# Patient Record
Sex: Female | Born: 1960 | Race: White | Hispanic: No | Marital: Married | Smoking: Former smoker
Health system: Southern US, Community
[De-identification: ages and names within clinical notes are randomized; demographics above are authoritative.]

## PROBLEM LIST (undated history)

## (undated) DIAGNOSIS — G8929 Other chronic pain: Secondary | ICD-10-CM

## (undated) DIAGNOSIS — I2699 Other pulmonary embolism without acute cor pulmonale: Secondary | ICD-10-CM

## (undated) DIAGNOSIS — I2510 Atherosclerotic heart disease of native coronary artery without angina pectoris: Secondary | ICD-10-CM

## (undated) DIAGNOSIS — I509 Heart failure, unspecified: Secondary | ICD-10-CM

## (undated) DIAGNOSIS — K219 Gastro-esophageal reflux disease without esophagitis: Secondary | ICD-10-CM

## (undated) DIAGNOSIS — K449 Diaphragmatic hernia without obstruction or gangrene: Secondary | ICD-10-CM

## (undated) DIAGNOSIS — M5136 Other intervertebral disc degeneration, lumbar region: Secondary | ICD-10-CM

## (undated) HISTORY — DX: Other intervertebral disc degeneration, lumbar region: M51.36

## (undated) HISTORY — DX: Other pulmonary embolism without acute cor pulmonale: I26.99

## (undated) HISTORY — DX: Atherosclerotic heart disease of native coronary artery without angina pectoris: I25.10

## (undated) HISTORY — DX: Gastro-esophageal reflux disease without esophagitis: K21.9

## (undated) HISTORY — DX: Diaphragmatic hernia without obstruction or gangrene: K44.9

## (undated) HISTORY — DX: Other chronic pain: G89.29

## (undated) HISTORY — DX: Heart failure, unspecified: I50.9

---

## 2001-01-26 ENCOUNTER — Inpatient Hospital Stay (HOSPITAL_COMMUNITY): Admission: EM | Admit: 2001-01-26 | Discharge: 2001-01-28 | Payer: Self-pay | Admitting: Psychiatry

## 2001-02-27 ENCOUNTER — Inpatient Hospital Stay (HOSPITAL_COMMUNITY): Admission: RE | Admit: 2001-02-27 | Discharge: 2001-02-28 | Payer: Self-pay | Admitting: Psychiatry

## 2003-02-05 ENCOUNTER — Ambulatory Visit (HOSPITAL_COMMUNITY): Admission: RE | Admit: 2003-02-05 | Discharge: 2003-02-05 | Payer: Self-pay | Admitting: Neurology

## 2010-01-22 ENCOUNTER — Encounter: Payer: Self-pay | Admitting: Neurology

## 2012-02-26 ENCOUNTER — Ambulatory Visit: Payer: Medicaid Other | Admitting: Physical Therapy

## 2012-03-05 ENCOUNTER — Ambulatory Visit: Payer: Medicaid Other | Attending: Anesthesiology | Admitting: Physical Therapy

## 2017-01-06 DIAGNOSIS — I219 Acute myocardial infarction, unspecified: Secondary | ICD-10-CM

## 2017-01-06 DIAGNOSIS — E785 Hyperlipidemia, unspecified: Secondary | ICD-10-CM

## 2017-01-06 DIAGNOSIS — Z720 Tobacco use: Secondary | ICD-10-CM

## 2017-01-06 DIAGNOSIS — R079 Chest pain, unspecified: Secondary | ICD-10-CM

## 2017-01-06 DIAGNOSIS — I10 Essential (primary) hypertension: Secondary | ICD-10-CM

## 2018-12-30 ENCOUNTER — Inpatient Hospital Stay (HOSPITAL_COMMUNITY)
Admission: AD | Admit: 2018-12-30 | Discharge: 2019-01-18 | DRG: 177 | Disposition: A | Discharge status: Home / Self Care | Payer: Medicaid Other | Source: Other Acute Inpatient Hospital | Attending: Student | Admitting: Student

## 2018-12-30 ENCOUNTER — Encounter (HOSPITAL_COMMUNITY): Payer: Self-pay | Admitting: Internal Medicine

## 2018-12-30 ENCOUNTER — Inpatient Hospital Stay (HOSPITAL_COMMUNITY): Payer: Medicaid Other

## 2018-12-30 ENCOUNTER — Other Ambulatory Visit: Payer: Self-pay

## 2018-12-30 DIAGNOSIS — J1289 Other viral pneumonia: Secondary | ICD-10-CM | POA: Diagnosis not present

## 2018-12-30 DIAGNOSIS — J9601 Acute respiratory failure with hypoxia: Secondary | ICD-10-CM | POA: Diagnosis present

## 2018-12-30 DIAGNOSIS — G8929 Other chronic pain: Secondary | ICD-10-CM | POA: Diagnosis present

## 2018-12-30 DIAGNOSIS — C7989 Secondary malignant neoplasm of other specified sites: Secondary | ICD-10-CM | POA: Diagnosis not present

## 2018-12-30 DIAGNOSIS — C7951 Secondary malignant neoplasm of bone: Secondary | ICD-10-CM | POA: Diagnosis present

## 2018-12-30 DIAGNOSIS — C3431 Malignant neoplasm of lower lobe, right bronchus or lung: Secondary | ICD-10-CM | POA: Diagnosis present

## 2018-12-30 DIAGNOSIS — C787 Secondary malignant neoplasm of liver and intrahepatic bile duct: Secondary | ICD-10-CM | POA: Diagnosis present

## 2018-12-30 DIAGNOSIS — I5041 Acute combined systolic (congestive) and diastolic (congestive) heart failure: Secondary | ICD-10-CM | POA: Diagnosis present

## 2018-12-30 DIAGNOSIS — I110 Hypertensive heart disease with heart failure: Secondary | ICD-10-CM | POA: Diagnosis not present

## 2018-12-30 DIAGNOSIS — C771 Secondary and unspecified malignant neoplasm of intrathoracic lymph nodes: Secondary | ICD-10-CM | POA: Diagnosis not present

## 2018-12-30 DIAGNOSIS — I5043 Acute on chronic combined systolic (congestive) and diastolic (congestive) heart failure: Secondary | ICD-10-CM | POA: Diagnosis not present

## 2018-12-30 DIAGNOSIS — K219 Gastro-esophageal reflux disease without esophagitis: Secondary | ICD-10-CM | POA: Diagnosis present

## 2018-12-30 DIAGNOSIS — Z7189 Other specified counseling: Secondary | ICD-10-CM | POA: Diagnosis not present

## 2018-12-30 DIAGNOSIS — E876 Hypokalemia: Secondary | ICD-10-CM | POA: Diagnosis present

## 2018-12-30 DIAGNOSIS — IMO0002 Reserved for concepts with insufficient information to code with codable children: Secondary | ICD-10-CM | POA: Diagnosis present

## 2018-12-30 DIAGNOSIS — R160 Hepatomegaly, not elsewhere classified: Secondary | ICD-10-CM

## 2018-12-30 DIAGNOSIS — E118 Type 2 diabetes mellitus with unspecified complications: Secondary | ICD-10-CM | POA: Diagnosis not present

## 2018-12-30 DIAGNOSIS — C799 Secondary malignant neoplasm of unspecified site: Secondary | ICD-10-CM | POA: Diagnosis not present

## 2018-12-30 DIAGNOSIS — F419 Anxiety disorder, unspecified: Secondary | ICD-10-CM | POA: Diagnosis present

## 2018-12-30 DIAGNOSIS — G4700 Insomnia, unspecified: Secondary | ICD-10-CM | POA: Diagnosis present

## 2018-12-30 DIAGNOSIS — K858 Other acute pancreatitis without necrosis or infection: Secondary | ICD-10-CM | POA: Diagnosis not present

## 2018-12-30 DIAGNOSIS — U071 COVID-19: Principal | ICD-10-CM | POA: Diagnosis present

## 2018-12-30 DIAGNOSIS — M545 Low back pain: Secondary | ICD-10-CM | POA: Diagnosis not present

## 2018-12-30 DIAGNOSIS — Z515 Encounter for palliative care: Secondary | ICD-10-CM

## 2018-12-30 DIAGNOSIS — R748 Abnormal levels of other serum enzymes: Secondary | ICD-10-CM | POA: Diagnosis present

## 2018-12-30 DIAGNOSIS — Z9861 Coronary angioplasty status: Secondary | ICD-10-CM | POA: Diagnosis not present

## 2018-12-30 DIAGNOSIS — I252 Old myocardial infarction: Secondary | ICD-10-CM

## 2018-12-30 DIAGNOSIS — F329 Major depressive disorder, single episode, unspecified: Secondary | ICD-10-CM | POA: Diagnosis present

## 2018-12-30 DIAGNOSIS — F17210 Nicotine dependence, cigarettes, uncomplicated: Secondary | ICD-10-CM | POA: Diagnosis present

## 2018-12-30 DIAGNOSIS — I10 Essential (primary) hypertension: Secondary | ICD-10-CM | POA: Diagnosis not present

## 2018-12-30 DIAGNOSIS — I2583 Coronary atherosclerosis due to lipid rich plaque: Secondary | ICD-10-CM | POA: Diagnosis not present

## 2018-12-30 DIAGNOSIS — Z720 Tobacco use: Secondary | ICD-10-CM | POA: Diagnosis not present

## 2018-12-30 DIAGNOSIS — J441 Chronic obstructive pulmonary disease with (acute) exacerbation: Secondary | ICD-10-CM | POA: Diagnosis not present

## 2018-12-30 DIAGNOSIS — G893 Neoplasm related pain (acute) (chronic): Secondary | ICD-10-CM | POA: Diagnosis present

## 2018-12-30 DIAGNOSIS — Z765 Malingerer [conscious simulation]: Secondary | ICD-10-CM | POA: Diagnosis not present

## 2018-12-30 DIAGNOSIS — Z7984 Long term (current) use of oral hypoglycemic drugs: Secondary | ICD-10-CM

## 2018-12-30 DIAGNOSIS — E663 Overweight: Secondary | ICD-10-CM | POA: Diagnosis present

## 2018-12-30 DIAGNOSIS — J440 Chronic obstructive pulmonary disease with acute lower respiratory infection: Secondary | ICD-10-CM | POA: Diagnosis not present

## 2018-12-30 DIAGNOSIS — E1165 Type 2 diabetes mellitus with hyperglycemia: Secondary | ICD-10-CM | POA: Diagnosis present

## 2018-12-30 DIAGNOSIS — I5042 Chronic combined systolic (congestive) and diastolic (congestive) heart failure: Secondary | ICD-10-CM | POA: Diagnosis not present

## 2018-12-30 DIAGNOSIS — I2510 Atherosclerotic heart disease of native coronary artery without angina pectoris: Secondary | ICD-10-CM | POA: Diagnosis not present

## 2018-12-30 DIAGNOSIS — R0602 Shortness of breath: Secondary | ICD-10-CM

## 2018-12-30 DIAGNOSIS — J1282 Pneumonia due to coronavirus disease 2019: Secondary | ICD-10-CM | POA: Diagnosis present

## 2018-12-30 DIAGNOSIS — Z6829 Body mass index (BMI) 29.0-29.9, adult: Secondary | ICD-10-CM | POA: Diagnosis not present

## 2018-12-30 DIAGNOSIS — K859 Acute pancreatitis without necrosis or infection, unspecified: Secondary | ICD-10-CM | POA: Diagnosis present

## 2018-12-30 DIAGNOSIS — R7401 Elevation of levels of liver transaminase levels: Secondary | ICD-10-CM | POA: Diagnosis present

## 2018-12-30 DIAGNOSIS — T501X5A Adverse effect of loop [high-ceiling] diuretics, initial encounter: Secondary | ICD-10-CM | POA: Diagnosis not present

## 2018-12-30 DIAGNOSIS — R1013 Epigastric pain: Secondary | ICD-10-CM | POA: Diagnosis not present

## 2018-12-30 DIAGNOSIS — E785 Hyperlipidemia, unspecified: Secondary | ICD-10-CM | POA: Diagnosis not present

## 2018-12-30 DIAGNOSIS — Z8711 Personal history of peptic ulcer disease: Secondary | ICD-10-CM

## 2018-12-30 DIAGNOSIS — C3491 Malignant neoplasm of unspecified part of right bronchus or lung: Secondary | ICD-10-CM | POA: Diagnosis not present

## 2018-12-30 DIAGNOSIS — Z86711 Personal history of pulmonary embolism: Secondary | ICD-10-CM

## 2018-12-30 DIAGNOSIS — J159 Unspecified bacterial pneumonia: Secondary | ICD-10-CM | POA: Diagnosis not present

## 2018-12-30 LAB — GLUCOSE, CAPILLARY
Glucose-Capillary: 158 mg/dL — ABNORMAL HIGH (ref 70–99)
Glucose-Capillary: 280 mg/dL — ABNORMAL HIGH (ref 70–99)
Glucose-Capillary: 175 mg/dL — ABNORMAL HIGH (ref 70–99)

## 2018-12-30 LAB — ABO/RH: ABO/RH(D): A POS

## 2018-12-30 LAB — TYPE AND SCREEN
ABO/RH(D): A POS
Antibody Screen: NEGATIVE

## 2018-12-30 LAB — COMPREHENSIVE METABOLIC PANEL
ALT: 28 U/L (ref 0–44)
AST: 36 U/L (ref 15–41)
Albumin: 1.8 g/dL — ABNORMAL LOW (ref 3.5–5.0)
Alkaline Phosphatase: 160 U/L — ABNORMAL HIGH (ref 38–126)
Anion gap: 12 (ref 5–15)
BUN: 17 mg/dL (ref 6–20)
CO2: 28 mmol/L (ref 22–32)
Calcium: 7.9 mg/dL — ABNORMAL LOW (ref 8.9–10.3)
Chloride: 101 mmol/L (ref 98–111)
Creatinine, Ser: 0.72 mg/dL (ref 0.44–1.00)
GFR calc Af Amer: >60 mL/min (ref >60)
GFR calc non Af Amer: >60 mL/min (ref >60)
Glucose, Bld: 155 mg/dL — ABNORMAL HIGH (ref 70–99)
Potassium: 3.3 mmol/L — ABNORMAL LOW (ref 3.5–5.1)
Sodium: 141 mmol/L (ref 135–145)
Total Bilirubin: 0.3 mg/dL (ref 0.3–1.2)
Total Protein: 5.6 g/dL — ABNORMAL LOW (ref 6.5–8.1)

## 2018-12-30 LAB — CBC WITH DIFFERENTIAL/PLATELET
Abs Immature Granulocytes: 0.24 10*3/uL — ABNORMAL HIGH (ref 0.00–0.07)
Basophils Absolute: 0 10*3/uL (ref 0.0–0.1)
Basophils Relative: 0 %
Eosinophils Absolute: 0 10*3/uL (ref 0.0–0.5)
Eosinophils Relative: 0 %
HCT: 35 % — ABNORMAL LOW (ref 36.0–46.0)
Hemoglobin: 10.4 g/dL — ABNORMAL LOW (ref 12.0–15.0)
Immature Granulocytes: 3 %
Lymphocytes Relative: 5 %
Lymphs Abs: 0.5 10*3/uL — ABNORMAL LOW (ref 0.7–4.0)
MCH: 25.6 pg — ABNORMAL LOW (ref 26.0–34.0)
MCHC: 29.7 g/dL — ABNORMAL LOW (ref 30.0–36.0)
MCV: 86.2 fL (ref 80.0–100.0)
Monocytes Absolute: 0.2 10*3/uL (ref 0.1–1.0)
Monocytes Relative: 2 %
Neutro Abs: 8.1 10*3/uL — ABNORMAL HIGH (ref 1.7–7.7)
Neutrophils Relative %: 90 %
Platelets: 248 10*3/uL (ref 150–400)
RBC: 4.06 MIL/uL (ref 3.87–5.11)
RDW: 15.7 % — ABNORMAL HIGH (ref 11.5–15.5)
WBC: 9 10*3/uL (ref 4.0–10.5)
nRBC: 0.4 % — ABNORMAL HIGH (ref 0.0–0.2)

## 2018-12-30 LAB — C-REACTIVE PROTEIN: CRP: 5.7 mg/dL — ABNORMAL HIGH (ref <1.0)

## 2018-12-30 LAB — PROCALCITONIN
Procalcitonin: 4.71 ng/mL
Procalcitonin: 4.41 ng/mL

## 2018-12-30 LAB — D-DIMER, QUANTITATIVE: D-Dimer, Quant: 1.92 ug/mL-FEU — ABNORMAL HIGH (ref 0.00–0.50)

## 2018-12-30 LAB — HEMOGLOBIN A1C
Hgb A1c MFr Bld: 7.2 % — ABNORMAL HIGH (ref 4.8–5.6)
Mean Plasma Glucose: 159.94 mg/dL

## 2018-12-30 LAB — HIV ANTIBODY (ROUTINE TESTING W REFLEX): HIV Screen 4th Generation wRfx: NONREACTIVE

## 2018-12-30 LAB — FERRITIN: Ferritin: 64 ng/mL (ref 11–307)

## 2018-12-30 LAB — BRAIN NATRIURETIC PEPTIDE: B Natriuretic Peptide: 398.8 pg/mL — ABNORMAL HIGH (ref 0.0–100.0)

## 2018-12-30 MED ORDER — METOPROLOL SUCCINATE ER 50 MG PO TB24
50.0000 mg | ORAL_TABLET | Freq: Every day | ORAL | Status: DC
  Administered 2018-12-30 – 2019-01-18 (×20): 50 mg via ORAL
  Filled 2018-12-30: qty 2
  Filled 2018-12-30 (×2): qty 1
  Filled 2018-12-30: qty 2
  Filled 2018-12-30: qty 1
  Filled 2018-12-30 (×3): qty 2
  Filled 2018-12-30: qty 1
  Filled 2018-12-30 (×4): qty 2
  Filled 2018-12-30 (×2): qty 1
  Filled 2018-12-30: qty 2
  Filled 2018-12-30: qty 1
  Filled 2018-12-30: qty 2
  Filled 2018-12-30: qty 1
  Filled 2018-12-30: qty 2

## 2018-12-30 MED ORDER — SODIUM CHLORIDE 0.9 % IV SOLN: 100.0000 mg | Freq: Every day | INTRAVENOUS | Status: DC

## 2018-12-30 MED ORDER — OXYCODONE HCL 5 MG PO TABS
5.0000 mg | ORAL_TABLET | Freq: Four times a day (QID) | ORAL | Status: DC | PRN
  Administered 2018-12-30 – 2018-12-31 (×3): 5 mg via ORAL
  Filled 2018-12-30 (×3): qty 1

## 2018-12-30 MED ORDER — ACETAMINOPHEN 325 MG PO TABS
650.0000 mg | ORAL_TABLET | Freq: Four times a day (QID) | ORAL | Status: DC | PRN
  Administered 2018-12-31 – 2019-01-08 (×4): 650 mg via ORAL
  Filled 2018-12-30 (×4): qty 2

## 2018-12-30 MED ORDER — SODIUM CHLORIDE 0.9 % IV SOLN
1.0000 g | INTRAVENOUS | Status: AC
  Administered 2018-12-30 – 2019-01-03 (×5): 1 g via INTRAVENOUS
  Filled 2018-12-30 (×5): qty 10

## 2018-12-30 MED ORDER — NITROGLYCERIN 0.4 MG SL SUBL: 0.4000 mg | SUBLINGUAL_TABLET | SUBLINGUAL | Status: DC | PRN

## 2018-12-30 MED ORDER — CEFTRIAXONE SODIUM 1 G IJ SOLR: 1.0000 g | INTRAMUSCULAR | Status: DC

## 2018-12-30 MED ORDER — PANTOPRAZOLE SODIUM 40 MG PO TBEC
40.0000 mg | DELAYED_RELEASE_TABLET | Freq: Two times a day (BID) | ORAL | Status: DC
  Administered 2018-12-30 – 2019-01-18 (×37): 40 mg via ORAL
  Filled 2018-12-30 (×40): qty 1

## 2018-12-30 MED ORDER — SODIUM CHLORIDE 0.9 % IV SOLN
500.0000 mg | INTRAVENOUS | Status: AC
  Administered 2018-12-30 – 2019-01-01 (×3): 500 mg via INTRAVENOUS
  Filled 2018-12-30 (×3): qty 500

## 2018-12-30 MED ORDER — ALBUTEROL SULFATE HFA 108 (90 BASE) MCG/ACT IN AERS
2.0000 | INHALATION_SPRAY | RESPIRATORY_TRACT | Status: DC | PRN
  Administered 2018-12-30 – 2019-01-17 (×9): 2 via RESPIRATORY_TRACT
  Filled 2018-12-30: qty 6.7

## 2018-12-30 MED ORDER — ONDANSETRON HCL 4 MG/2ML IJ SOLN
4.0000 mg | Freq: Four times a day (QID) | INTRAMUSCULAR | Status: DC | PRN
  Administered 2019-01-03 – 2019-01-06 (×4): 4 mg via INTRAVENOUS
  Filled 2018-12-30 (×5): qty 2

## 2018-12-30 MED ORDER — INSULIN ASPART 100 UNIT/ML ~~LOC~~ SOLN
0.0000 [IU] | Freq: Three times a day (TID) | SUBCUTANEOUS | Status: DC
  Administered 2018-12-30: 5 [IU] via SUBCUTANEOUS
  Administered 2018-12-30: 2 [IU] via SUBCUTANEOUS
  Administered 2018-12-31: 3 [IU] via SUBCUTANEOUS
  Administered 2018-12-31 – 2019-01-01 (×3): 2 [IU] via SUBCUTANEOUS
  Administered 2019-01-01: 1 [IU] via SUBCUTANEOUS
  Administered 2019-01-01: 5 [IU] via SUBCUTANEOUS
  Administered 2019-01-01: 17:00:00 2 [IU] via SUBCUTANEOUS
  Administered 2019-01-02: 1 [IU] via SUBCUTANEOUS
  Administered 2019-01-03: 13:00:00 2 [IU] via SUBCUTANEOUS
  Administered 2019-01-04 – 2019-01-05 (×3): 1 [IU] via SUBCUTANEOUS
  Administered 2019-01-05: 21:00:00 3 [IU] via SUBCUTANEOUS
  Administered 2019-01-05 (×2): 1 [IU] via SUBCUTANEOUS
  Administered 2019-01-06: 13:00:00 2 [IU] via SUBCUTANEOUS
  Administered 2019-01-06: 09:00:00 1 [IU] via SUBCUTANEOUS
  Administered 2019-01-06: 2 [IU] via SUBCUTANEOUS
  Administered 2019-01-07: 1 [IU] via SUBCUTANEOUS
  Administered 2019-01-08: 17:00:00 2 [IU] via SUBCUTANEOUS
  Administered 2019-01-08: 13:00:00 7 [IU] via SUBCUTANEOUS
  Administered 2019-01-09: 13:00:00 3 [IU] via SUBCUTANEOUS
  Administered 2019-01-09 – 2019-01-10 (×3): 2 [IU] via SUBCUTANEOUS

## 2018-12-30 MED ORDER — SODIUM CHLORIDE 0.9 % IV SOLN: 250.0000 mL | INTRAVENOUS | Status: DC | PRN

## 2018-12-30 MED ORDER — SUCRALFATE 1 GM/10ML PO SUSP
1.0000 g | Freq: Three times a day (TID) | ORAL | Status: DC
  Administered 2018-12-30 – 2019-01-18 (×67): 1 g via ORAL
  Filled 2018-12-30 (×80): qty 10

## 2018-12-30 MED ORDER — SODIUM CHLORIDE 0.9 % IV SOLN
100.0000 mg | Freq: Every day | INTRAVENOUS | Status: AC
  Administered 2018-12-31 – 2019-01-02 (×3): 100 mg via INTRAVENOUS
  Filled 2018-12-30 (×3): qty 20

## 2018-12-30 MED ORDER — BISACODYL 5 MG PO TBEC: 5.0000 mg | DELAYED_RELEASE_TABLET | Freq: Every day | ORAL | Status: DC | PRN

## 2018-12-30 MED ORDER — ALPRAZOLAM 0.5 MG PO TABS
0.2500 mg | ORAL_TABLET | Freq: Three times a day (TID) | ORAL | Status: DC | PRN
  Administered 2018-12-30 – 2018-12-31 (×2): 0.25 mg via ORAL
  Filled 2018-12-30 (×2): qty 1

## 2018-12-30 MED ORDER — SODIUM CHLORIDE 0.9% FLUSH
3.0000 mL | Freq: Two times a day (BID) | INTRAVENOUS | Status: DC
  Administered 2018-12-30 – 2019-01-18 (×37): 3 mL via INTRAVENOUS

## 2018-12-30 MED ORDER — ASPIRIN 81 MG PO CHEW
81.0000 mg | CHEWABLE_TABLET | Freq: Every day | ORAL | Status: DC
  Administered 2018-12-30 – 2019-01-15 (×17): 81 mg via ORAL
  Filled 2018-12-30 (×17): qty 1

## 2018-12-30 MED ORDER — METHYLPREDNISOLONE SODIUM SUCC 40 MG IJ SOLR
40.0000 mg | Freq: Two times a day (BID) | INTRAMUSCULAR | Status: DC
  Administered 2018-12-30 – 2019-01-01 (×5): 40 mg via INTRAVENOUS
  Filled 2018-12-30 (×5): qty 1

## 2018-12-30 MED ORDER — SODIUM CHLORIDE 0.9 % IV SOLN
200.0000 mg | Freq: Once | INTRAVENOUS | Status: AC
  Administered 2018-12-30: 200 mg via INTRAVENOUS
  Filled 2018-12-30: qty 20

## 2018-12-30 MED ORDER — HYDROCOD POLST-CPM POLST ER 10-8 MG/5ML PO SUER
5.0000 mL | Freq: Two times a day (BID) | ORAL | Status: DC | PRN
  Administered 2019-01-02: 5 mL via ORAL
  Filled 2018-12-30: qty 5

## 2018-12-30 MED ORDER — ALUM & MAG HYDROXIDE-SIMETH 200-200-20 MG/5ML PO SUSP: 30.0000 mL | Freq: Four times a day (QID) | ORAL | Status: DC | PRN

## 2018-12-30 MED ORDER — ONDANSETRON HCL 4 MG PO TABS: 4.0000 mg | ORAL_TABLET | Freq: Four times a day (QID) | ORAL | Status: DC | PRN

## 2018-12-30 MED ORDER — FAMOTIDINE 20 MG PO TABS: 20.0000 mg | ORAL_TABLET | Freq: Every day | ORAL | Status: DC

## 2018-12-30 MED ORDER — POLYETHYLENE GLYCOL 3350 17 G PO PACK
17.0000 g | PACK | Freq: Every day | ORAL | Status: DC
  Administered 2018-12-31 – 2019-01-14 (×8): 17 g via ORAL
  Filled 2018-12-30 (×16): qty 1

## 2018-12-30 MED ORDER — ZINC SULFATE 220 (50 ZN) MG PO CAPS
220.0000 mg | ORAL_CAPSULE | Freq: Every day | ORAL | Status: DC
  Administered 2018-12-31 – 2019-01-18 (×19): 220 mg via ORAL
  Filled 2018-12-30 (×18): qty 1

## 2018-12-30 MED ORDER — POLYETHYLENE GLYCOL 3350 17 G PO PACK: 17.0000 g | PACK | Freq: Every day | ORAL | Status: DC | PRN

## 2018-12-30 MED ORDER — ASCORBIC ACID 500 MG PO TABS
500.0000 mg | ORAL_TABLET | Freq: Every day | ORAL | Status: DC
  Administered 2018-12-31 – 2019-01-18 (×19): 500 mg via ORAL
  Filled 2018-12-30 (×19): qty 1

## 2018-12-30 MED ORDER — CLOPIDOGREL BISULFATE 75 MG PO TABS
75.0000 mg | ORAL_TABLET | Freq: Every day | ORAL | Status: DC
  Administered 2018-12-30 – 2019-01-11 (×13): 75 mg via ORAL
  Filled 2018-12-30 (×13): qty 1

## 2018-12-30 MED ORDER — GUAIFENESIN-DM 100-10 MG/5ML PO SYRP
10.0000 mL | ORAL_SOLUTION | ORAL | Status: DC | PRN
  Administered 2019-01-09: 10 mL via ORAL
  Filled 2018-12-30: qty 10

## 2018-12-30 MED ORDER — SODIUM CHLORIDE 0.9% FLUSH
3.0000 mL | INTRAVENOUS | Status: DC | PRN
  Administered 2019-01-14: 10:00:00 3 mL via INTRAVENOUS

## 2018-12-30 MED ORDER — ENOXAPARIN SODIUM 40 MG/0.4ML ~~LOC~~ SOLN
40.0000 mg | SUBCUTANEOUS | Status: DC
  Administered 2018-12-30 – 2018-12-31 (×2): 40 mg via SUBCUTANEOUS
  Filled 2018-12-30 (×2): qty 0.4

## 2018-12-30 MED ORDER — IPRATROPIUM-ALBUTEROL 20-100 MCG/ACT IN AERS
1.0000 | INHALATION_SPRAY | Freq: Four times a day (QID) | RESPIRATORY_TRACT | Status: DC
  Administered 2018-12-30 – 2019-01-13 (×48): 1 via RESPIRATORY_TRACT
  Filled 2018-12-30: qty 4

## 2018-12-30 NOTE — H&P (Signed)
HISTORY AND PHYSICAL       PATIENT DETAILS Name: Emily Thomas Age: 58 y.o. Sex: female Date of Birth: November 07, 1960 Admit Date: 12/30/2018 [id number], No Pcp Per   Patient coming from: Transfer from [place]:  Shortness of breath since December 15.  HPI: Emily Thomas is a 58 y.o. female with medical history significant of chronic back pain due to degenerative disc disease, DM-2, HTN, history of remote pulmonary embolism no longer on anticoagulation, CAD s/p PCI approximately 1 year back-chronic epigastric pain-being worked up in the outpatient setting (EGD/colonoscopy scheduled)-presenting as a transfer from [place] for evaluation of acute hypoxic respiratory failure in the setting of COVID-19 pneumonia.  Per patient, for the past 1 month or so-she has had worsening chronic back pain-and epigastric pain.  She apparently recently saw her primary care/GI MD-and was prepped for colonoscopy/endoscopy-however due to poor bowel preparation-endoscopy evaluation could not be completed.  She claims that since December 15-after she completed a bowel prep for colonoscopy-she started feeling fatigue with generalized myalgias.  She then started developing shortness of breath that has gradually progressed over the past few days.  She presented to [facility] she had severe exertional dyspnea and could hardly walk a few feet.  At [facility] emergency room-patient was found to have pneumonia-her COVID-19 swab was positive-and she was requiring around 5 L to maintain O2 saturations above 90.  Subsequently case was discussed with the prior hospitalist at [facility] patient was accepted as a transfer  Patient denies any fever, nausea, vomiting or diarrhea.  Note: Lives at: Home Mobility: Independent Chronic Indwelling Foley:yes   REVIEW OF SYSTEMS:  Constitutional:   No  weight loss, night sweats.  HEENT:    No  headaches, Dysphagia,Tooth/dental problems,Sore throat  Cardio-vascular: No chest pain,Orthopnea, PND,lower extremity edema, anasarca, palpitations  GI:  No heartburn, indigestion, nausea, vomiting, diarrhea, melena or hematochezia  Resp: No  hemoptysis,plueritic chest pain.   Skin:  No rash or lesions.  GU:  No dysuria, change in color of urine, no urgency or frequency.  No flank pain.  Musculoskeletal: No joint pain or swelling.  No decreased range of motion.  No back pain.  Endocrine: No heat intolerance, no cold intolerance, no polyuria, no polydipsia  Psych: No change in mood or affect. No depression or anxiety.  No memory loss.   ALLERGIES:  Not on File  PAST MEDICAL HISTORY: . CHF (congestive heart failure), NYHA class II, chronic, systolic (HCC-CMS)  . Coronary artery disease involving native coronary artery of native heart with angina pectoris (HCC-CMS)  . Dyslipidemia  . Essential hypertension  . Failed back surgical syndrome  . GERD (gastroesophageal reflux disease)  . History of ST elevation myocardial infarction (STEMI)  . Insomnia  . Type 2 diabetes mellitus with hyperglycemia, without long-term current use of insulin (HCC-CMS)    PAST SURGICAL HISTORY: APPENDECTOMY    MEDICATIONS AT HOME: Prior to Admission medications   Not on File    FAMILY HISTORY: Denies history of CAD in the family.  SOCIAL HISTORY:  has no history on file for tobacco, alcohol, and drug.  PHYSICAL EXAM: Blood pressure (!) 154/71, pulse 87, temperature 98.3 F (36.8 C), temperature source Oral, resp. rate (!) 22, weight 72 kg, SpO2 92 %.  General appearance :Awake, alert, not in any distress.  Eyes:, pupils equally reactive to light and accomodation,no scleral icterus.Pink conjunctiva HEENT: Atraumatic and Normocephalic Neck:  supple, no JVD.  Resp:Good air entry bilaterally, no added sounds  CVS: S1 S2 regular, no murmurs.  GI: Bowel sounds present, Non tender and  not distended with no gaurding, rigidity or rebound. Extremities: B/L Lower Ext shows no edema, both legs are warm to touch Neurology:  speech clear,Non focal, sensation is grossly intact. Psychiatric: Normal judgment and insight. Alert and oriented x 3. Normal mood. Musculoskeletal:gait appears to be normal.No digital cyanosis Skin:No Rash, warm and dry Wounds:N/A  LABS ON ADMISSION:  I have personally reviewed following labs and imaging studies  CBC: Recent Labs  Lab 12/30/18 1110  WBC 9.0  NEUTROABS 8.1*  HGB 10.4*  HCT 35.0*  MCV 86.2  PLT 326    Basic Metabolic Panel: Recent Labs  Lab 12/30/18 1110  NA 141  K 3.3*  CL 101  CO2 28  GLUCOSE 155*  BUN 17  CREATININE 0.72  CALCIUM 7.9*    GFR: CrCl cannot be calculated (Unknown ideal weight.).  Liver Function Tests: Recent Labs  Lab 12/30/18 1110  AST 36  ALT 28  ALKPHOS 160*  BILITOT 0.3  PROT 5.6*  ALBUMIN 1.8*   No results for input(s): LIPASE, AMYLASE in the last 168 hours. No results for input(s): AMMONIA in the last 168 hours.  Coagulation Profile: No results for input(s): INR, PROTIME in the last 168 hours.  Cardiac Enzymes: No results for input(s): CKTOTAL, CKMB, CKMBINDEX, TROPONINI in the last 168 hours.  BNP (last 3 results) No results for input(s): PROBNP in the last 8760 hours.  HbA1C: No results for input(s): HGBA1C in the last 72 hours.  CBG: No results for input(s): GLUCAP in the last 168 hours.  Lipid Profile: No results for input(s): CHOL, HDL, LDLCALC, TRIG, CHOLHDL, LDLDIRECT in the last 72 hours.  Thyroid Function Tests: No results for input(s): TSH, T4TOTAL, FREET4, T3FREE, THYROIDAB in the last 72 hours.  Anemia Panel: Recent Labs    12/30/18 1110  FERRITIN 64    Urine analysis: No results found for: COLORURINE, APPEARANCEUR, LABSPEC, PHURINE, GLUCOSEU, HGBUR, BILIRUBINUR, KETONESUR, PROTEINUR, UROBILINOGEN, NITRITE, LEUKOCYTESUR  Sepsis Labs: Lactic Acid,  Venous No results found for: [place]   Microbiology: No results found for this or any previous visit (from the past 240 hour(s)).    RADIOLOGIC STUDIES ON ADMISSION: DG Chest Port 1V today  Result Date: 12/30/2018 CLINICAL DATA:  Shortness of breath.  COVID-19 pneumonia. EXAM: PORTABLE CHEST 1 VIEW COMPARISON:  12/29/2018-[facility] FINDINGS: Midline trachea. Mild cardiomegaly. Similar small right pleural effusion. No pneumothorax. Slightly worsened aeration, with bilateral interstitial opacities. Slight increase in right mid and lower lung opacification. Numerous leads and wires project over the chest. IMPRESSION: Minimal progression of multifocal, bilateral pneumonia. Similar small right pleural effusion. Electronically Signed   By: Abigail Miyamoto M.D.   On: 12/30/2018 12:36    I have personally reviewed images of chest xray -bilateral pneumonia.  EKG:    ASSESSMENT AND PLAN: Acute hypoxic respiratory failure secondary to COVID-19 pneumonia and concurrent bacterial pneumonia: Remains stable on 4-5 L of oxygen-procalcitonin is significantly elevated-suspect has concomitant bacterial infection.  Plans are to start steroids, remdesivir-and empiric Rocephin/Zithromax.  Follow inflammatory markers, repeat pro calcitonin in a few days.  COVID-19 Labs  Recent Labs    12/30/18 1110  DDIMER 1.92*  FERRITIN 64  CRP 5.7*    No results found for: SARSCOV2NAA   Covid medications: Steroids: 12/27>> Remdesivir: 12/27>>  Other medications: Rocephin: 12/27>> Zithromax: 12/27>> Diuretics: Lasix 40 mg IV x1  to maintain negative balance.  History of CAD s/p PCI: No anginal symptoms-continue aspirin/Plavix.  Chronic systolic heart failure: Euvolemic-Lasix 40 mg x 1 IV to maintain negative balance.  Follow.  HTN: Continue with metoprolol-follow-resume lisinopril the next few days.  DM-2: Hold Metformin-await A1c-follow CBGs closely while on SSI.  Epigastric pain: Currently  being worked up in the outpatient setting-see notes in care everywhere.  Will start PPI twice daily dosing-and add Carafate.  Abdomen is soft nontender-very benign appearing.  Chronic back pain: As needed oxycodone-start scheduled MiraLAX.  Remote history of VTE (pulmonary embolism): Start prophylactic Lovenox-we will resume aspirin/Plavix.  Encourage ambulation with PT.  History of tobacco abuse: Claims she has not smoked or used a vape in approximately 30 days.  Further plan will depend as patient's clinical course evolves and further radiologic and laboratory data become available. Patient will be monitored closely.  Above noted plan was discussed with patient-she was in agreement.  CONSULTS: None   DVT Prophylaxis: Prophylactic Lovenox   Code Status: Full Code  Disposition Plan:  Discharge back home  Admission status:  Inpatient  going to tele  The medical decision making on this patient was of high complexity and the patient is at high risk for clinical deterioration, therefore this is a level 3 visit.   Total time spent  55 minutes.Greater than 50% of this time was spent in counseling, explanation of diagnosis, planning of further management, and coordination of care.  Severity of illness: The appropriate patient status for this patient is INPATIENT. Inpatient status is judged to be reasonable and necessary in order to provide the required intensity of service to ensure the patient's safety. The patient's presenting symptoms, physical exam findings, and initial radiographic and laboratory data in the context of their chronic comorbidities is felt to place them at high risk for further clinical deterioration. Furthermore, it is not anticipated that the patient will be medically stable for discharge from the hospital within 2 midnights of admission. The following factors support the patient status of inpatient.   " The patient's presenting symptoms include shortness of  breath " The worrisome physical exam findings include hypoxemia " The initial radiographic and laboratory data are worrisome because of pneumonia on x-ray " The chronic co-morbidities include CAD, DM-2, HTN   * I certify that at the point of admission it is my clinical judgment that the patient will require inpatient hospital care spanning beyond 2 midnights from the point of admission due to high intensity of service, high risk for further deterioration and high frequency of surveillance required.**  Rakiya Krawczyk [facility] Pager [phone]  If 7PM-7AM, please contact night-coverage  Please page via [email or web address]  Go to [email or web address] and use [place]'s universal password to access. If you do not have the password, please contact the hospital operator.  Locate the [facility] provider you are looking for under [facility] and page to a number that you can be directly reached. If you still have difficulty reaching the provider, please page the [facility] (Director on Call) for the Hospitalists listed on amion for assistance.  12/30/2018, 2:03 PM

## 2018-12-31 DIAGNOSIS — G8929 Other chronic pain: Secondary | ICD-10-CM

## 2018-12-31 DIAGNOSIS — M545 Low back pain: Secondary | ICD-10-CM

## 2018-12-31 LAB — CBC WITH DIFFERENTIAL/PLATELET
Abs Immature Granulocytes: 0.45 10*3/uL — ABNORMAL HIGH (ref 0.00–0.07)
Basophils Absolute: 0.1 10*3/uL (ref 0.0–0.1)
Basophils Relative: 1 %
Eosinophils Absolute: 0 10*3/uL (ref 0.0–0.5)
Eosinophils Relative: 0 %
HCT: 38.9 % (ref 36.0–46.0)
Hemoglobin: 11.5 g/dL — ABNORMAL LOW (ref 12.0–15.0)
Immature Granulocytes: 3 %
Lymphocytes Relative: 6 %
Lymphs Abs: 0.9 10*3/uL (ref 0.7–4.0)
MCH: 24.9 pg — ABNORMAL LOW (ref 26.0–34.0)
MCHC: 29.6 g/dL — ABNORMAL LOW (ref 30.0–36.0)
MCV: 84.4 fL (ref 80.0–100.0)
Monocytes Absolute: 0.4 10*3/uL (ref 0.1–1.0)
Monocytes Relative: 2 %
Neutro Abs: 13.4 10*3/uL — ABNORMAL HIGH (ref 1.7–7.7)
Neutrophils Relative %: 88 %
Platelets: 367 10*3/uL (ref 150–400)
RBC: 4.61 MIL/uL (ref 3.87–5.11)
RDW: 15.9 % — ABNORMAL HIGH (ref 11.5–15.5)
WBC: 15.2 10*3/uL — ABNORMAL HIGH (ref 4.0–10.5)
nRBC: 0.3 % — ABNORMAL HIGH (ref 0.0–0.2)

## 2018-12-31 LAB — COMPREHENSIVE METABOLIC PANEL
ALT: 30 U/L (ref 0–44)
AST: 38 U/L (ref 15–41)
Albumin: 2.1 g/dL — ABNORMAL LOW (ref 3.5–5.0)
Alkaline Phosphatase: 196 U/L — ABNORMAL HIGH (ref 38–126)
Anion gap: 13 (ref 5–15)
BUN: 21 mg/dL — ABNORMAL HIGH (ref 6–20)
CO2: 27 mmol/L (ref 22–32)
Calcium: 8.2 mg/dL — ABNORMAL LOW (ref 8.9–10.3)
Chloride: 101 mmol/L (ref 98–111)
Creatinine, Ser: 0.62 mg/dL (ref 0.44–1.00)
GFR calc Af Amer: >60 mL/min (ref >60)
GFR calc non Af Amer: >60 mL/min (ref >60)
Glucose, Bld: 161 mg/dL — ABNORMAL HIGH (ref 70–99)
Potassium: 3.3 mmol/L — ABNORMAL LOW (ref 3.5–5.1)
Sodium: 141 mmol/L (ref 135–145)
Total Bilirubin: 0.5 mg/dL (ref 0.3–1.2)
Total Protein: 6.2 g/dL — ABNORMAL LOW (ref 6.5–8.1)

## 2018-12-31 LAB — GLUCOSE, CAPILLARY
Glucose-Capillary: 152 mg/dL — ABNORMAL HIGH (ref 70–99)
Glucose-Capillary: 166 mg/dL — ABNORMAL HIGH (ref 70–99)
Glucose-Capillary: 233 mg/dL — ABNORMAL HIGH (ref 70–99)
Glucose-Capillary: 234 mg/dL — ABNORMAL HIGH (ref 70–99)
Glucose-Capillary: 165 mg/dL — ABNORMAL HIGH (ref 70–99)

## 2018-12-31 LAB — D-DIMER, QUANTITATIVE: D-Dimer, Quant: 2.24 ug/mL-FEU — ABNORMAL HIGH (ref 0.00–0.50)

## 2018-12-31 LAB — C-REACTIVE PROTEIN: CRP: 3.6 mg/dL — ABNORMAL HIGH (ref <1.0)

## 2018-12-31 LAB — PROCALCITONIN: Procalcitonin: 4.45 ng/mL

## 2018-12-31 LAB — FERRITIN: Ferritin: 68 ng/mL (ref 11–307)

## 2018-12-31 MED ORDER — LORAZEPAM 1 MG PO TABS
1.0000 mg | ORAL_TABLET | Freq: Three times a day (TID) | ORAL | Status: DC | PRN
  Administered 2018-12-31 – 2019-01-11 (×26): 1 mg via ORAL
  Filled 2018-12-31 (×3): qty 1
  Filled 2018-12-31: qty 2
  Filled 2018-12-31 (×5): qty 1
  Filled 2018-12-31: qty 2
  Filled 2018-12-31: qty 1
  Filled 2018-12-31: qty 2
  Filled 2018-12-31: qty 1
  Filled 2018-12-31: qty 2
  Filled 2018-12-31 (×2): qty 1
  Filled 2018-12-31: qty 2
  Filled 2018-12-31 (×3): qty 1
  Filled 2018-12-31 (×2): qty 2
  Filled 2018-12-31: qty 1
  Filled 2018-12-31: qty 2
  Filled 2018-12-31 (×2): qty 1
  Filled 2018-12-31: qty 2

## 2018-12-31 MED ORDER — SENNOSIDES-DOCUSATE SODIUM 8.6-50 MG PO TABS
2.0000 | ORAL_TABLET | Freq: Every day | ORAL | Status: DC
  Administered 2018-12-31 – 2019-01-17 (×15): 2 via ORAL
  Filled 2018-12-31 (×16): qty 2

## 2018-12-31 MED ORDER — FAMOTIDINE 20 MG PO TABS
20.0000 mg | ORAL_TABLET | Freq: Every day | ORAL | Status: DC
  Administered 2018-12-31 – 2019-01-18 (×19): 20 mg via ORAL
  Filled 2018-12-31 (×19): qty 1

## 2018-12-31 MED ORDER — OXYCODONE HCL 5 MG PO TABS
10.0000 mg | ORAL_TABLET | Freq: Four times a day (QID) | ORAL | Status: DC | PRN
  Administered 2018-12-31 – 2019-01-03 (×12): 10 mg via ORAL
  Filled 2018-12-31 (×14): qty 2

## 2018-12-31 MED ORDER — BENZOCAINE 10 % MT GEL
1.0000 "application " | Freq: Four times a day (QID) | OROMUCOSAL | Status: DC | PRN
  Administered 2018-12-31 – 2019-01-01 (×2): 1 via OROMUCOSAL
  Filled 2018-12-31 (×2): qty 9

## 2018-12-31 MED ORDER — LISINOPRIL 10 MG PO TABS
10.0000 mg | ORAL_TABLET | Freq: Every day | ORAL | Status: DC
  Administered 2018-12-31 – 2019-01-18 (×19): 10 mg via ORAL
  Filled 2018-12-31 (×18): qty 1

## 2018-12-31 MED ORDER — FUROSEMIDE 10 MG/ML IJ SOLN
40.0000 mg | Freq: Two times a day (BID) | INTRAMUSCULAR | Status: DC
  Administered 2018-12-31 – 2019-01-11 (×23): 40 mg via INTRAVENOUS
  Filled 2018-12-31 (×25): qty 4

## 2018-12-31 MED ORDER — POTASSIUM CHLORIDE CRYS ER 20 MEQ PO TBCR
40.0000 meq | EXTENDED_RELEASE_TABLET | ORAL | Status: AC
  Administered 2018-12-31 (×2): 40 meq via ORAL
  Filled 2018-12-31 (×2): qty 2

## 2018-12-31 NOTE — Progress Notes (Signed)
PHYSICAL THERAPY EVALUATION  CLINICAL IMPRESSION: Pt admitted with above diagnosis. PTA states was independent, states she stays with her sons who sometimes help with ADLs etc but they both do work and she spends a lot of time on her own. Pt currently with functional limitations due to the deficits listed below (see PT Problem List). Pt c/o pain in back at 8/10 at rest, she was on 4L/min via [place] and at rest sats between 89-90% with ambulation in room sats decrease to low 80s with cues and pursed lip breathing able to recover to high 88/89% Pt requested to use restroom and after this pt is noted to have desat to 79% on 6L/min via [place]. Again with cues and use of flutter valve was able to recover saturations to high 80s again.  Pt will benefit from skilled PT to increase their independence and safety with mobility to allow discharge to the venue listed below.      12/31/18 1000  PT Visit Information  Last PT Received On 12/31/18  Assistance Needed +1  History of Present Illness 58 y.o. female with medical history significant of chronic back pain due to degenerative disc disease, DM-2, HTN, history of remote pulmonary embolism no longer on anticoagulation, CAD s/p PCI approximately 1 year back-chronic epigastric pain-being worked up in the outpatient setting (EGD/colonoscopy scheduled)-presenting as a transfer from [place] for evaluation of acute hypoxic respiratory failure in the setting of COVID-19 pneumonia.  Subjective Data  Patient Stated Goal get better  Precautions  Precautions Fall  Restrictions  Weight Bearing Restrictions No  Pain Assessment  Pain Assessment 0-10  Pain Score 8  Pain Location in back, has chronic back pain  Pain Intervention(s) Limited activity within patient's tolerance;Monitored during session  Cognition  Arousal/Alertness Awake/alert  Behavior During Therapy [facility] for tasks assessed/performed  Overall Cognitive Status Within Functional Limits for tasks assessed   Bed Mobility  Overal bed mobility Modified Independent  Transfers  Overall transfer level Needs assistance  Equipment used None  Transfers Sit to/from Stand;Stand Pivot Transfers  Sit to Stand Supervision  Stand pivot transfers Supervision  Ambulation/Gait  Ambulation/Gait assistance Min guard  Gait Distance (Feet) 44 Feet  Assistive device None  Gait Pattern/deviations Step-through pattern  Gait velocity decreased  Balance  Overall balance assessment Mild deficits observed, not formally tested  Exercises  Exercises Other exercises  Other Exercises  Other Exercises flutter valve use  Other Exercises incentive spirometer use  PT - End of Session  Equipment Utilized During Treatment Oxygen  Activity Tolerance Patient limited by lethargy;Patient limited by fatigue;Patient limited by pain;Treatment limited secondary to medical complications (Comment)  Patient left in chair;with call bell/phone within reach  Nurse Communication Mobility status   PT - Assessment/Plan  PT Visit Diagnosis Other abnormalities of gait and mobility (R26.89);History of falling (Z91.81)  PT Frequency (ACUTE ONLY) Min 3X/week  Recommendations for Other Services OT consult  Follow Up Recommendations Home health PT  PT equipment Other (comment)  AM-PAC PT "6 Clicks" Mobility Outcome Measure (Version 2)  Help needed turning from your back to your side while in a flat bed without using bedrails? 4  Help needed moving from lying on your back to sitting on the side of a flat bed without using bedrails? 4  Help needed moving to and from a bed to a chair (including a wheelchair)? 3  Help needed standing up from a chair using your arms (e.g., wheelchair or bedside chair)? 3  Help needed to walk in  hospital room? 3  Help needed climbing 3-5 steps with a railing?  2  6 Click Score 19  Consider Recommendation of Discharge To: Home with Hospital [place]  Acute Rehab PT Goals  PT Goal Formulation With patient  Time For Goal  Achievement 01/14/19  Potential to Achieve Goals Good  PT Time Calculation  PT Start Time (ACUTE ONLY) 0930  PT Stop Time (ACUTE ONLY) 1001  PT Time Calculation (min) (ACUTE ONLY) 31 min  PT General Charges  $$ ACUTE PT VISIT 1 Visit  PT Evaluation  $PT Eval Moderate Complexity 1 Mod  PT Treatments  $Therapeutic Activity 8-22 mins   Horald Chestnut, PT

## 2018-12-31 NOTE — Progress Notes (Signed)
Patient requested more pain medicine after reporting pain at 8/10 that she states is not subsiding with the current oxycodone dose. Messaged the doctor.  He states to alternate Tylenol and oxycodone for pain.  No new orders at this time.

## 2018-12-31 NOTE — Progress Notes (Addendum)
PROGRESS NOTE                                                                                                                                                                                                             Patient Demographics:    Emily Thomas, is a 58 y.o. female, DOB - 1960-04-15, INO:[id number]  Outpatient Primary MD for the patient is Patient, No Pcp Per   Admit date - 12/30/2018   LOS - 1  No chief complaint on file.      Brief Narrative: Patient is a 58 y.o. female with PMHx of chronic back pain due to degenerative disc disease, HTN, DM-2, CAD s/p PCI, chronic epigastric pain (being worked up at [facility] scheduled for EGD/colonoscopy), tobacco abuse, remote history of pulmonary embolism no longer on anticoagulation-presenting as a transfer from [place] emergency room-for evaluation of acute hypoxic respiratory failure in the setting of COVID-19 and concurrent bacterial pneumonia    Subjective:    Humberto Seals today pains stable-she requires around 5 L of oxygen to maintain O2 saturation.  Last night she was on just 2 L.  She complains of chronic back pain, abdominal pain.  She claims that at one point she was on methadone-and narcotics and "weaned herself" off it-but gives vague answers when asked when was the last time she took narcotics as an outpatient.  Reviewed PDMP--looks like she is only being prescribed lorazepam-do not see any narcotics for the past 1 year.   Assessment  & Plan :   Acute Hypoxic Resp Failure due to Covid 19 Viral pneumonia and concurrent bacterial pneumonia: Remains stable on 4-5 L of oxygen-plans are to continue with IV steroids/remdesivir/Rocephin/Zithromax.  She appears very comfortable-and is not tachypneic.  She does have some intermittent anxiety issues.  Fever: afebrile  O2 requirements:  SpO2: 92 % O2 Flow Rate (L/min): 5 L/min   COVID-19 Labs: Recent  Labs    12/30/18 1110 12/31/18 0425  DDIMER 1.92* 2.24*  FERRITIN 64 68  CRP 5.7* 3.6*       Component Value Date/Time   BNP 398.8 (H) 12/30/2018 1110    Recent Labs  Lab 12/30/18 1110 12/30/18 1700 12/31/18 0425  PROCALCITON 4.71 4.41 4.45    No results found for: SARSCOV2NAA   Covid medications: Steroids: 12/27>> Remdesivir: 12/27>>  Other medications: Rocephin: 12/27>>  Zithromax: 12/27>>  Prone/Incentive Spirometry: encouraged  incentive spirometry use 3-4/hour.  DVT Prophylaxis  :  Lovenox  History of CAD s/p PCI: No anginal symptoms-continue aspirin/Plavix.  Chronic systolic heart failure: Has a bit more lower extremity edema today-around 1+-change Lasix to 40 mg IV twice daily.  Follow weights/volume status/electrolytes daily.    Hypokalemia: Replete and recheck.  HTN:  BP on the higher side-continue metoprolol-we will resume lisinopril.  DM-2 (A1c 7.2): CBG stable with SSI  CBG (last 3)  Recent Labs    12/30/18 1632 12/30/18 2210 12/31/18 0821  GLUCAP 280* 175* 152*     Epigastric pain: Currently being worked up in the outpatient setting-see notes in care everywhere.  Per patient-she was scheduled for a EGD/colonoscopy-but was canceled due to inadequate colon prep.  She continues to complain of epigastric pain-continue PPI/Carafate-add Pepcid.  Abdomen is soft and nontender.   Chronic back pain: Per patient-she apparently has been on methadone-and oxycodone in the past.  Continue as needed oxycodone-on MiraLAX/Senokot for bowel regimen.  Per patient her last BM was yesterday.  Remote history of VTE (pulmonary embolism): Start prophylactic Lovenox-we will resume aspirin/Plavix.  Encourage ambulation with PT.  Anxiety: Slightly anxious due to acute illness-continue home dose of lorazepam.  History of tobacco abuse: Claims she has not smoked or used a vape in approximately 30 days.  Consults  :  None  Procedures  :  None  ABG: No  results found for: PHART, PCO2ART, PO2ART, HCO3, TCO2, ACIDBASEDEF, O2SAT  Vent Settings: N/A   Condition - Stable  Family Communication  : Left voicemail for spouse.  Code Status :  Full Code Diet :  Diet Order            Diet Heart Room service appropriate? Yes; Fluid consistency: Thin  Diet effective now               Disposition Plan  :  Remain hospitalized  Barriers to discharge: Hypoxia requiring O2 supplementation/complete 5 days of IV Remdesivir  Antimicorbials  :    Anti-infectives (From admission, onward)   Start     Dose/Rate Route Frequency Ordered Stop   12/31/18 1000  remdesivir 100 mg in sodium chloride 0.9 % 100 mL IVPB  Status:  Discontinued     100 mg 200 mL/hr over 30 Minutes Intravenous Daily 12/30/18 1049 12/30/18 1432   12/31/18 1000  remdesivir 100 mg in sodium chloride 0.9 % 100 mL IVPB     100 mg 200 mL/hr over 30 Minutes Intravenous Daily 12/30/18 1432 01/03/19 0959   12/30/18 1500  azithromycin (ZITHROMAX) 500 mg in sodium chloride 0.9 % 250 mL IVPB     500 mg 250 mL/hr over 60 Minutes Intravenous Every 24 hours 12/30/18 1351 01/02/19 1459   12/30/18 1415  cefTRIAXone (ROCEPHIN) 1 g in sodium chloride 0.9 % 100 mL IVPB     1 g 200 mL/hr over 30 Minutes Intravenous Every 24 hours 12/30/18 1401 01/04/19 1414   12/30/18 1400  cefTRIAXone (ROCEPHIN) injection 1 g  Status:  Discontinued     1 g Intramuscular Every 24 hours 12/30/18 1351 12/30/18 1400   12/30/18 1200  remdesivir 200 mg in sodium chloride 0.9% 250 mL IVPB     200 mg 580 mL/hr over 30 Minutes Intravenous Once 12/30/18 1049 12/30/18 1306      Inpatient Medications  Scheduled Meds: . vitamin C  500 mg Oral Daily  . aspirin  81 mg Oral Daily  . clopidogrel  75 mg Oral Daily  . enoxaparin (LOVENOX) injection  40 mg Subcutaneous Q24H  . furosemide  40 mg Intravenous BID  . insulin aspart  0-9 Units Subcutaneous TID WC  . Ipratropium-Albuterol  1 puff Inhalation Q6H  .  methylPREDNISolone (SOLU-MEDROL) injection  40 mg Intravenous Q12H  . metoprolol succinate  50 mg Oral Daily  . pantoprazole  40 mg Oral BID  . polyethylene glycol  17 g Oral Daily  . senna-docusate  2 tablet Oral QHS  . sodium chloride flush  3 mL Intravenous Q12H  . sucralfate  1 g Oral TID AC & HS  . zinc sulfate  220 mg Oral Daily   Continuous Infusions: . sodium chloride    . azithromycin 500 mg (12/30/18 1508)  . cefTRIAXone (ROCEPHIN)  IV 1 g (12/30/18 1418)  . remdesivir 100 mg in NS 100 mL 100 mg (12/31/18 1010)   PRN Meds:.sodium chloride, acetaminophen, albuterol, ALPRAZolam, alum & mag hydroxide-simeth, bisacodyl, chlorpheniramine-HYDROcodone, guaiFENesin-dextromethorphan, nitroGLYCERIN, ondansetron **OR** ondansetron (ZOFRAN) IV, oxyCODONE, sodium chloride flush   Time Spent in minutes  35    See all Orders from today for further details   Oren Binet M.D on 12/31/2018 at 11:25 AM  To page go to [email or web address] - use universal password  [facility] -  Office  [phone]    Objective:   Vitals:   12/30/18 1937 12/30/18 1953 12/31/18 0446 12/31/18 0818  BP: (!) 160/86 (!) 160/86 (!) 158/76 (!) 152/85  Pulse: 89 96 71 76  Resp: 20 20  18   Temp: 98.3 F (36.8 C) 98.3 F (36.8 C) 98.1 F (36.7 C) 97.9 F (36.6 C)  TempSrc: Oral Oral Oral Oral  SpO2: 92%   92%  Weight:  72.5 kg    Height:  5' 1.97" (1.574 m)      Wt Readings from Last 3 Encounters:  12/30/18 72.5 kg     Intake/Output Summary (Last 24 hours) at 12/31/2018 1125 Last data filed at 12/31/2018 0600 Gross per 24 hour  Intake 480 ml  Output 200 ml  Net 280 ml     Physical Exam Gen Exam:Alert awake-not in any distress HEENT:atraumatic, normocephalic Chest: B/L clear to auscultation anteriorly CVS:S1S2 regular Abdomen:soft non tender, non distended Extremities:+ edema Neurology: Non focal Skin: no rash   Data Review:    CBC Recent Labs  Lab 12/30/18 1110  12/31/18 0425  WBC 9.0 15.2*  HGB 10.4* 11.5*  HCT 35.0* 38.9  PLT 248 367  MCV 86.2 84.4  MCH 25.6* 24.9*  MCHC 29.7* 29.6*  RDW 15.7* 15.9*  LYMPHSABS 0.5* 0.9  MONOABS 0.2 0.4  EOSABS 0.0 0.0  BASOSABS 0.0 0.1    Chemistries  Recent Labs  Lab 12/30/18 1110 12/31/18 0425  NA 141 141  K 3.3* 3.3*  CL 101 101  CO2 28 27  GLUCOSE 155* 161*  BUN 17 21*  CREATININE 0.72 0.62  CALCIUM 7.9* 8.2*  AST 36 38  ALT 28 30  ALKPHOS 160* 196*  BILITOT 0.3 0.5   ------------------------------------------------------------------------------------------------------------------ No results for input(s): CHOL, HDL, LDLCALC, TRIG, CHOLHDL, LDLDIRECT in the last 72 hours.  Lab Results  Component Value Date   HGBA1C 7.2 (H) 12/30/2018   ------------------------------------------------------------------------------------------------------------------ No results for input(s): TSH, T4TOTAL, T3FREE, THYROIDAB in the last 72 hours.  Invalid input(s): FREET3 ------------------------------------------------------------------------------------------------------------------ Recent Labs    12/30/18 1110 12/31/18 0425  FERRITIN 64 68    Coagulation profile No results for input(s): INR, PROTIME in the last 168 hours.  Recent Labs    12/30/18 1110 12/31/18 0425  DDIMER 1.92* 2.24*    Cardiac Enzymes No results for input(s): CKMB, TROPONINI, MYOGLOBIN in the last 168 hours.  Invalid input(s): CK ------------------------------------------------------------------------------------------------------------------    Component Value Date/Time   BNP 398.8 (H) 12/30/2018 1110    Micro Results No results found for this or any previous visit (from the past 240 hour(s)).  Radiology Reports DG Chest Port 1V today  Result Date: 12/30/2018 CLINICAL DATA:  Shortness of breath.  COVID-19 pneumonia. EXAM: PORTABLE CHEST 1 VIEW COMPARISON:  12/29/2018-[facility] FINDINGS: Midline  trachea. Mild cardiomegaly. Similar small right pleural effusion. No pneumothorax. Slightly worsened aeration, with bilateral interstitial opacities. Slight increase in right mid and lower lung opacification. Numerous leads and wires project over the chest. IMPRESSION: Minimal progression of multifocal, bilateral pneumonia. Similar small right pleural effusion. Electronically Signed   By: Abigail Miyamoto M.D.   On: 12/30/2018 12:36

## 2018-12-31 NOTE — Progress Notes (Signed)
Spoke with Margaretmary Bayley regarding updates on the patient's condition.  All questions answered at this time.

## 2018-12-31 NOTE — Progress Notes (Signed)
Pharmacy Medication Storage Note  Storing home medications for Emily Thomas in pharmacy secured storage.   Medication storage bag number: [id number]   Medications will be returned to patient/caregiver upon discharge.  Emily Thomas 12/31/18 9:16 AM

## 2019-01-01 ENCOUNTER — Inpatient Hospital Stay (HOSPITAL_COMMUNITY): Payer: Medicaid Other

## 2019-01-01 DIAGNOSIS — Z720 Tobacco use: Secondary | ICD-10-CM | POA: Diagnosis present

## 2019-01-01 DIAGNOSIS — IMO0002 Reserved for concepts with insufficient information to code with codable children: Secondary | ICD-10-CM | POA: Diagnosis present

## 2019-01-01 DIAGNOSIS — E1165 Type 2 diabetes mellitus with hyperglycemia: Secondary | ICD-10-CM | POA: Diagnosis present

## 2019-01-01 DIAGNOSIS — I2510 Atherosclerotic heart disease of native coronary artery without angina pectoris: Secondary | ICD-10-CM | POA: Diagnosis present

## 2019-01-01 DIAGNOSIS — R1013 Epigastric pain: Secondary | ICD-10-CM

## 2019-01-01 DIAGNOSIS — I10 Essential (primary) hypertension: Secondary | ICD-10-CM | POA: Diagnosis present

## 2019-01-01 DIAGNOSIS — G8929 Other chronic pain: Secondary | ICD-10-CM | POA: Diagnosis present

## 2019-01-01 DIAGNOSIS — I5041 Acute combined systolic (congestive) and diastolic (congestive) heart failure: Secondary | ICD-10-CM | POA: Diagnosis present

## 2019-01-01 LAB — COMPREHENSIVE METABOLIC PANEL
ALT: 30 U/L (ref 0–44)
AST: 41 U/L (ref 15–41)
Albumin: 1.9 g/dL — ABNORMAL LOW (ref 3.5–5.0)
Alkaline Phosphatase: 175 U/L — ABNORMAL HIGH (ref 38–126)
Anion gap: 12 (ref 5–15)
BUN: 21 mg/dL — ABNORMAL HIGH (ref 6–20)
CO2: 29 mmol/L (ref 22–32)
Calcium: 7.7 mg/dL — ABNORMAL LOW (ref 8.9–10.3)
Chloride: 99 mmol/L (ref 98–111)
Creatinine, Ser: 0.63 mg/dL (ref 0.44–1.00)
GFR calc Af Amer: >60 mL/min (ref >60)
GFR calc non Af Amer: >60 mL/min (ref >60)
Glucose, Bld: 147 mg/dL — ABNORMAL HIGH (ref 70–99)
Potassium: 3.3 mmol/L — ABNORMAL LOW (ref 3.5–5.1)
Sodium: 140 mmol/L (ref 135–145)
Total Bilirubin: 0.4 mg/dL (ref 0.3–1.2)
Total Protein: 5.8 g/dL — ABNORMAL LOW (ref 6.5–8.1)

## 2019-01-01 LAB — CBC WITH DIFFERENTIAL/PLATELET
Abs Immature Granulocytes: 0.44 10*3/uL — ABNORMAL HIGH (ref 0.00–0.07)
Basophils Absolute: 0 10*3/uL (ref 0.0–0.1)
Basophils Relative: 0 %
Eosinophils Absolute: 0 10*3/uL (ref 0.0–0.5)
Eosinophils Relative: 0 %
HCT: 36.5 % (ref 36.0–46.0)
Hemoglobin: 11 g/dL — ABNORMAL LOW (ref 12.0–15.0)
Immature Granulocytes: 4 %
Lymphocytes Relative: 7 %
Lymphs Abs: 0.8 10*3/uL (ref 0.7–4.0)
MCH: 25.4 pg — ABNORMAL LOW (ref 26.0–34.0)
MCHC: 30.1 g/dL (ref 30.0–36.0)
MCV: 84.3 fL (ref 80.0–100.0)
Monocytes Absolute: 0.3 10*3/uL (ref 0.1–1.0)
Monocytes Relative: 3 %
Neutro Abs: 10.1 10*3/uL — ABNORMAL HIGH (ref 1.7–7.7)
Neutrophils Relative %: 86 %
Platelets: 269 10*3/uL (ref 150–400)
RBC: 4.33 MIL/uL (ref 3.87–5.11)
RDW: 15.9 % — ABNORMAL HIGH (ref 11.5–15.5)
WBC: 11.7 10*3/uL — ABNORMAL HIGH (ref 4.0–10.5)
nRBC: 0.3 % — ABNORMAL HIGH (ref 0.0–0.2)

## 2019-01-01 LAB — GLUCOSE, CAPILLARY
Glucose-Capillary: 135 mg/dL — ABNORMAL HIGH (ref 70–99)
Glucose-Capillary: 289 mg/dL — ABNORMAL HIGH (ref 70–99)
Glucose-Capillary: 198 mg/dL — ABNORMAL HIGH (ref 70–99)

## 2019-01-01 LAB — FERRITIN: Ferritin: 85 ng/mL (ref 11–307)

## 2019-01-01 LAB — C-REACTIVE PROTEIN: CRP: 2.8 mg/dL — ABNORMAL HIGH (ref <1.0)

## 2019-01-01 LAB — MAGNESIUM: Magnesium: 1.6 mg/dL — ABNORMAL LOW (ref 1.7–2.4)

## 2019-01-01 LAB — PROCALCITONIN: Procalcitonin: 5.05 ng/mL

## 2019-01-01 LAB — PHOSPHORUS: Phosphorus: 2 mg/dL — ABNORMAL LOW (ref 2.5–4.6)

## 2019-01-01 LAB — D-DIMER, QUANTITATIVE: D-Dimer, Quant: 2.64 ug/mL-FEU — ABNORMAL HIGH (ref 0.00–0.50)

## 2019-01-01 MED ORDER — POTASSIUM CHLORIDE CRYS ER 20 MEQ PO TBCR
10.0000 meq | EXTENDED_RELEASE_TABLET | Freq: Once | ORAL | Status: AC
  Administered 2019-01-01: 10 meq via ORAL
  Filled 2019-01-01: qty 1

## 2019-01-01 MED ORDER — SALINE SPRAY 0.65 % NA SOLN
1.0000 | NASAL | Status: DC | PRN
  Administered 2019-01-02: 1 via NASAL
  Filled 2019-01-01: qty 44

## 2019-01-01 MED ORDER — BIOTENE DRY MOUTH MT LIQD
15.0000 mL | OROMUCOSAL | Status: DC | PRN
  Filled 2019-01-01: qty 15

## 2019-01-01 MED ORDER — POTASSIUM CHLORIDE CRYS ER 20 MEQ PO TBCR: 50.0000 meq | EXTENDED_RELEASE_TABLET | Freq: Once | ORAL | Status: DC

## 2019-01-01 MED ORDER — DEXAMETHASONE SODIUM PHOSPHATE 10 MG/ML IJ SOLN
6.0000 mg | INTRAMUSCULAR | Status: DC
  Administered 2019-01-01 – 2019-01-05 (×5): 6 mg via INTRAVENOUS
  Filled 2019-01-01 (×5): qty 1

## 2019-01-01 MED ORDER — MAGNESIUM SULFATE 2 GM/50ML IV SOLN
2.0000 g | Freq: Once | INTRAVENOUS | Status: AC
  Administered 2019-01-01: 2 g via INTRAVENOUS
  Filled 2019-01-01: qty 50

## 2019-01-01 MED ORDER — ENOXAPARIN SODIUM 40 MG/0.4ML ~~LOC~~ SOLN
40.0000 mg | SUBCUTANEOUS | Status: DC
  Administered 2019-01-01 – 2019-01-09 (×9): 40 mg via SUBCUTANEOUS
  Filled 2019-01-01 (×9): qty 0.4

## 2019-01-01 MED ORDER — POTASSIUM CHLORIDE CRYS ER 20 MEQ PO TBCR
40.0000 meq | EXTENDED_RELEASE_TABLET | Freq: Once | ORAL | Status: AC
  Administered 2019-01-01: 40 meq via ORAL
  Filled 2019-01-01: qty 2

## 2019-01-01 MED ORDER — INSULIN GLARGINE 100 UNIT/ML ~~LOC~~ SOLN
5.0000 [IU] | Freq: Every day | SUBCUTANEOUS | Status: DC
  Administered 2019-01-01 – 2019-01-06 (×6): 5 [IU] via SUBCUTANEOUS
  Filled 2019-01-01 (×6): qty 0.05

## 2019-01-01 NOTE — Progress Notes (Signed)
PROGRESS NOTE    Emily Thomas  ZOX:[id number] DOB: [birth year] DOA: 12/30/2018 PCP: Patient, No Pcp Per   Brief Narrative:  Emily Thomas is a 58 y.o. WF PMHx tobacco abuse, chronic back pain due to degenerative disc disease, diabetes type 2 uncontrolled with complication, HTN,CAD s/p PCI approximately 1 year back-chronic, Hx remote pulmonary embolism no longer on anticoagulation, epigastric pain-being worked up in the outpatient setting (EGD/colonoscopy scheduled), Hx gastric ulcer, Hx hiatal hernia  Presenting as a transfer from [place] for evaluation of acute hypoxic respiratory failure in the setting of COVID-19 pneumonia.  Per patient, for the past 1 month or so-she has had worsening chronic back pain-and epigastric pain.  She apparently recently saw her primary care/GI MD-and was prepped for colonoscopy/endoscopy-however due to poor bowel preparation-endoscopy evaluation could not be completed.  She claims that since December 15-after she completed a bowel prep for colonoscopy-she started feeling fatigue with generalized myalgias.  She then started developing shortness of breath that has gradually progressed over the past few days.  She presented to [facility] she had severe exertional dyspnea and could hardly walk a few feet.  At [place] emergency room-patient was found to have pneumonia-her COVID-19 swab was positive-and she was requiring around 5 L to maintain O2 saturations above 90.  Subsequently case was discussed with the prior hospitalist at [facility] patient was accepted as a transfer  Patient denies any fever, nausea, vomiting or diarrhea.   Subjective: A/O x4, positive abdominal pain chronic, positive back pain chronic, positive SOB.   Assessment & Plan:   Principal Problem:   Acute respiratory failure with hypoxemia (HCC) Active Problems:   Pneumonia due to COVID-19 virus   HTN (hypertension)   Chronic systolic  CHF (congestive heart failure) (HCC)   CAD (coronary artery disease)   Essential hypertension   Diabetes mellitus type 2, uncontrolled, with complications (HCC)   Chronic epigastric pain   Tobacco abuse  Covid pneumonia/acute respiratory failure with hypoxia COVID-19 Labs  Recent Labs    12/30/18 1110 12/31/18 0425 01/01/19 0253  DDIMER 1.92* 2.24* 2.64*  FERRITIN 64 68 85  CRP 5.7* 3.6* 2.8*    No results found for: SARSCOV2NAA -Decadron 6 mg daily -Remdesivir per pharmacy protocol -Combivent -Titrate O2 to maintain SPO2> 88% -Flutter valve -Incentive spirometer -Prone patient 16 hours/day; if patient cannot stand that length of time prone to 3 hours every shift  Chronic systolic CHF -Unknown based weight -Strict ins and outs -Daily weight -Patient with suppose it heart failure but no echocardiogram on file. -12/30 echocardiogram pending -Lasix IV 40 mg BID -Lisinopril 10 mg daily -Toprol 50 mg daily  Hx CAD s/p PCI -ASA 81 mg daily -Plavix.  75 mg daily  HTN -See CHF  Diabetes type 2 uncontrolled with complication* -81/19 hemoglobin A1c= 7.2 -Sensitive SSI -12/30 Lantus 5 units daily  Epigastric pain (chronic) -Currently being worked up as an outpatient -Hx gastric ulcer and hiatal hernia.:  -Abdomen currently soft, nontender palpation -Maalox -Carafate 1 gTID  Chronic back pain -Has been on methadone and oxycodone in the past. -Current pain regimen  Benzodiazepine abuse? -Staff has observed that patient arrived with a new bottle of lorazepam and 20 tablets have been used within 24 hours. -Monitor closely for withdrawal  Remote history of VTE (pulmonary embolism): -Start prophylactic Lovenox-we will resume aspirin/Plavix. Encourage ambulation with PT.  Anxiety: Slightly anxious due to acute illness-continue home dose of lorazepam.  History of tobacco abuse: -Smokes  2 packs/day stopped approximately 3 weeks ago when she became ill.     Hypokalemia -Potassium goal> 4 -K-Dur 50 mEq  Hypophosphatemia  Hypomagnesmia -Magnesium goal> 2 -Magnesium IV 2 g    DVT prophylaxis: Lovenox Code Status: Full Family Communication:  Disposition Plan: TBD   Consultants:    Procedures/Significant Events:     I have personally reviewed and interpreted all radiology studies and my findings are as above.  VENTILATOR SETTINGS: Nasal cannula Flow; 5 L/min SPO2; 92%   Cultures 12/28 HIV screen negative     Antimicrobials: Anti-infectives (From admission, onward)   Start     Dose/Rate Stop   12/31/18 1000  remdesivir 100 mg in sodium chloride 0.9 % 100 mL IVPB  Status:  Discontinued     100 mg 200 mL/hr over 30 Minutes 12/30/18 1432   12/31/18 1000  remdesivir 100 mg in sodium chloride 0.9 % 100 mL IVPB     100 mg 200 mL/hr over 30 Minutes 01/03/19 0959   12/30/18 1500  azithromycin (ZITHROMAX) 500 mg in sodium chloride 0.9 % 250 mL IVPB     500 mg 250 mL/hr over 60 Minutes 01/02/19 1459   12/30/18 1415  cefTRIAXone (ROCEPHIN) 1 g in sodium chloride 0.9 % 100 mL IVPB     1 g 200 mL/hr over 30 Minutes 01/04/19 1414   12/30/18 1400  cefTRIAXone (ROCEPHIN) injection 1 g  Status:  Discontinued     1 g 12/30/18 1400   12/30/18 1200  remdesivir 200 mg in sodium chloride 0.9% 250 mL IVPB     200 mg 580 mL/hr over 30 Minutes 12/30/18 1306       Devices    LINES / TUBES:     Continuous Infusions: . sodium chloride    . azithromycin 500 mg (12/31/18 1500)  . cefTRIAXone (ROCEPHIN)  IV 1 g (01/01/19 1514)  . remdesivir 100 mg in NS 100 mL 100 mg (01/01/19 0951)     Objective: Vitals:   01/01/19 0400 01/01/19 0715 01/01/19 1130 01/01/19 1203  BP: (!) 154/99 (!) 154/77 139/72 (!) 143/87  Pulse: 90 79 74 81  Resp: 20 17 20 16   Temp: 97.7 F (36.5 C) 97.6 F (36.4 C) 97.6 F (36.4 C) 98.2 F (36.8 C)  TempSrc: Oral Oral Oral Oral  SpO2: 92% 93% 97% 91%  Weight:      Height:         Intake/Output Summary (Last 24 hours) at 01/01/2019 1559 Last data filed at 01/01/2019 0600 Gross per 24 hour  Intake --  Output 1500 ml  Net -1500 ml   Filed Weights   12/30/18 1200 12/30/18 1953  Weight: 72 kg 72.5 kg    Examination:  General: A/O x4, positive  acute respiratory distress Eyes: negative scleral hemorrhage, negative anisocoria, negative icterus ENT: Negative Runny nose, negative gingival bleeding, Neck:  Negative scars, masses, torticollis, lymphadenopathy, JVD Lungs: Positive inspiratory and expiratory wheezing Cardiovascular: Regular rate and rhythm without murmur gallop or rub normal S1 and S2 Abdomen: negative abdominal pain, nondistended, positive soft, bowel sounds, no rebound, no ascites, no appreciable mass Extremities: No significant cyanosis, clubbing, or edema bilateral lower extremities Skin: Negative rashes, lesions, ulcers Psychiatric:  Negative depression, positive anxiety, negative fatigue, negative mania  Central nervous system:  Cranial nerves II through XII intact, tongue/uvula midline, all extremities muscle strength 5/5, sensation intact throughout, negative dysarthria, negative expressive aphasia, negative receptive aphasia.  .     Data Reviewed: Care during  the described time interval was provided by me .  I have reviewed this patient's available data, including medical history, events of note, physical examination, and all test results as part of my evaluation.   CBC: Recent Labs  Lab 12/30/18 1110 12/31/18 0425 01/01/19 0253  WBC 9.0 15.2* 11.7*  NEUTROABS 8.1* 13.4* 10.1*  HGB 10.4* 11.5* 11.0*  HCT 35.0* 38.9 36.5  MCV 86.2 84.4 84.3  PLT 248 367 035   Basic Metabolic Panel: Recent Labs  Lab 12/30/18 1110 12/31/18 0425 01/01/19 0253  NA 141 141 140  K 3.3* 3.3* 3.3*  CL 101 101 99  CO2 28 27 29   GLUCOSE 155* 161* 147*  BUN 17 21* 21*  CREATININE 0.72 0.62 0.63  CALCIUM 7.9* 8.2* 7.7*  MG  --   --  1.6*  PHOS   --   --  2.0*   GFR: Estimated Creatinine Clearance: 70.5 mL/min (by C-G formula based on SCr of 0.63 mg/dL). Liver Function Tests: Recent Labs  Lab 12/30/18 1110 12/31/18 0425 01/01/19 0253  AST 36 38 41  ALT 28 30 30   ALKPHOS 160* 196* 175*  BILITOT 0.3 0.5 0.4  PROT 5.6* 6.2* 5.8*  ALBUMIN 1.8* 2.1* 1.9*   No results for input(s): LIPASE, AMYLASE in the last 168 hours. No results for input(s): AMMONIA in the last 168 hours. Coagulation Profile: No results for input(s): INR, PROTIME in the last 168 hours. Cardiac Enzymes: No results for input(s): CKTOTAL, CKMB, CKMBINDEX, TROPONINI in the last 168 hours. BNP (last 3 results) No results for input(s): PROBNP in the last 8760 hours. HbA1C: Recent Labs    12/30/18 1110  HGBA1C 7.2*   CBG: Recent Labs  Lab 12/31/18 1621 12/31/18 2023 12/31/18 2137 01/01/19 0719 01/01/19 1200  GLUCAP 233* 234* 165* 135* 289*   Lipid Profile: No results for input(s): CHOL, HDL, LDLCALC, TRIG, CHOLHDL, LDLDIRECT in the last 72 hours. Thyroid Function Tests: No results for input(s): TSH, T4TOTAL, FREET4, T3FREE, THYROIDAB in the last 72 hours. Anemia Panel: Recent Labs    12/31/18 0425 01/01/19 0253  FERRITIN 68 85   Urine analysis: No results found for: COLORURINE, APPEARANCEUR, LABSPEC, PHURINE, GLUCOSEU, HGBUR, BILIRUBINUR, KETONESUR, PROTEINUR, UROBILINOGEN, NITRITE, LEUKOCYTESUR Sepsis Labs: @LABRCNTIP (procalcitonin:4,lacticidven:4)  )No results found for this or any previous visit (from the past 240 hour(s)).       Radiology Studies: No results found.      Scheduled Meds: . vitamin C  500 mg Oral Daily  . aspirin  81 mg Oral Daily  . clopidogrel  75 mg Oral Daily  . dexamethasone (DECADRON) injection  6 mg Intravenous Q24H  . enoxaparin (LOVENOX) injection  40 mg Subcutaneous Q24H  . famotidine  20 mg Oral Daily  . furosemide  40 mg Intravenous BID  . insulin aspart  0-9 Units Subcutaneous TID WC  .  insulin glargine  5 Units Subcutaneous Daily  . Ipratropium-Albuterol  1 puff Inhalation Q6H  . lisinopril  10 mg Oral Daily  . metoprolol succinate  50 mg Oral Daily  . pantoprazole  40 mg Oral BID  . polyethylene glycol  17 g Oral Daily  . senna-docusate  2 tablet Oral QHS  . sodium chloride flush  3 mL Intravenous Q12H  . sucralfate  1 g Oral TID AC & HS  . zinc sulfate  220 mg Oral Daily   Continuous Infusions: . sodium chloride    . azithromycin 500 mg (12/31/18 1500)  . cefTRIAXone (ROCEPHIN)  IV  1 g (01/01/19 1514)  . remdesivir 100 mg in NS 100 mL 100 mg (01/01/19 0951)     LOS: 2 days   The patient is critically ill with multiple organ systems failure and requires high complexity decision making for assessment and support, frequent evaluation and titration of therapies, application of advanced monitoring technologies and extensive interpretation of multiple databases. Critical Care Time devoted to patient care services described in this note  Time spent: 40 minutes     Aanshi Batchelder, Geraldo Docker, MD [facility] Pager [phone]  If 7PM-7AM, please contact night-coverage [email or web address] Password [email or web address] 01/01/2019, 3:59 PM

## 2019-01-01 NOTE — CV Procedure (Signed)
2D echo attempted, but patient in chair and stated she did not want to do it today. Will attempt echo tomorrow.

## 2019-01-01 NOTE — Progress Notes (Signed)
Occupational Therapy Evaluation  Clinical Impression: PTA pt lived with son, independent in self-care and mobility tasks. Per pt, son assists her in completing IADLs. Pt does not ambulate with an assistive device and reports 0 falls in the last 6 months. Pt does not use oxygen at home and currently requires 5L [place] at the hospital. Pt currently independent to min assist for self-care and functional transfer tasks. Pt able to ambulate to/from bathroom with min guard and without an assistive device. Noted 1 instance of loss of balance with pt able to self-correct. Pt completed toileting task, unable to stand long enough to complete hygiene at the sink due to pain and shortness of breath. SpO2 decreased to low 80s on 6L [place] during task with pt requiring 5 min seated rest break to return to 90% on 5L [place]. 3/4 DOE. Educated pt on safety strategies, activity modifications, energy conservation, and relaxation strategies. Pt demonstrates decreased strength, endurance, balance, standing tolerance, and activity tolerance impacting ability to complete self-care and functional transfer tasks. Recommend skilled OT services to address above deficits in order to promote function and prevent further decline. Recommend [facility] OT for continued rehab following hospital discharge.    01/01/19 0904  OT Visit Information  Last OT Received On 01/01/19  Assistance Needed +1  History of Present Illness 58 y.o. female with medical history significant of chronic back pain due to degenerative disc disease, DM-2, HTN, history of remote pulmonary embolism no longer on anticoagulation, CAD s/p PCI approximately 1 year back-chronic epigastric pain-being worked up in the outpatient setting (EGD/colonoscopy scheduled)-presenting as a transfer from [place] for evaluation of acute hypoxic respiratory failure in the setting of COVID-19 pneumonia.  Precautions  Precautions Fall  Restrictions  Weight Bearing Restrictions No  Home Living   Family/patient expects to be discharged to: Private residence  [place]  Available Help at Discharge Family  Type of [place] to enter  Entrance Stairs-Number of Steps 1  [place] One level  Bathroom Shower/Tub Walk-in Engineer, materials None  Prior Function  Level of Independence Needs assistance  Gait / Transfers Assistance Needed Independent without an AD. Pt reports 0 falls in the last 6 months.  ADL's / Homemaking Assistance Needed Pt independent in ADLs. Son assists with IADLs. Pt still drives.  Comments Pt does not use oxgeyn at home  Communication  Communication No difficulties  Pain Assessment  Pain Assessment 0-10  Pain Score 8  Pain Location back and stomach  Pain Descriptors / Indicators Constant  Pain Intervention(s) Monitored during session;Other (comment);Limited activity within patient's tolerance (Notified RN)  Cognition  Arousal/Alertness Lethargic  Behavior During Therapy WFL for tasks assessed/performed  Overall Cognitive Status No family/caregiver present to determine baseline cognitive functioning  Upper Extremity Assessment  Upper Extremity Assessment Generalized weakness  Lower Extremity Assessment  Lower Extremity Assessment Defer to PT evaluation  ADL  Overall ADL's  Needs assistance/impaired  Eating/Feeding Independent;Sitting  Grooming Minimal assistance;Sitting  Upper Body Bathing Minimal assistance;Sitting  Lower Body Bathing Minimal assistance;Sit to/from stand  Upper Body Dressing  Set up;Supervision/safety;Sitting  Lower Body Dressing Minimal assistance;Sit to/from Chartered loss adjuster;Ambulation;Grab bars  Toileting- Water quality scientist and Hygiene Min guard;Sit to/from stand  Functional mobility during ADLs Min guard  General ADL Comments Pt able to ambulate to/from bathroom with min guard and without use of AD. Noted 1 instance  of LOB with pt able to self-correct.  Cues for safety awareness and to slow pace.   Vision- History  Baseline Vision/History Wears glasses  Wears Glasses At all times  Bed Mobility  Overal bed mobility Modified Independent  General bed mobility comments HOB elevated, use of bedrail  Transfers  Overall transfer level Needs assistance  Equipment used None  Transfers Sit to/from Stand;Stand Pivot Transfers  Sit to Stand Supervision  Stand pivot transfers Supervision  Balance  Overall balance assessment Mild deficits observed, not formally tested  General Comments  General comments (skin integrity, edema, etc.) Pain impacting pt's performance and level of independence this date. Pt on 5L [place] with SpO2 90% at rest. Pt able to ambulate to/from bathroom and complete toileting task with SpO2 decreasing to low 80s on 6L [place]. Pt requied ~5 min seated rest break for SpO2 to return to 90% on 5L [place].   Exercises  Exercises Other exercises  Other Exercises  Other Exercises Incentive spirometer x 10. Pulling 349mL.  Other Exercises Flutter valve x 10.   OT - End of Session  Equipment Utilized During Treatment Oxygen  Activity Tolerance Patient limited by fatigue (Limited by pain and SOB.)  Patient left in chair;with call bell/phone within reach  Nurse Communication Mobility status  OT Assessment  OT Recommendation/Assessment Patient needs continued OT Services  OT Visit Diagnosis Unsteadiness on feet (R26.81);Muscle weakness (generalized) (M62.81);Pain  Pain - part of body  (Back and abdomen)  OT Problem List Decreased strength;Decreased activity tolerance;Impaired balance (sitting and/or standing);Decreased safety awareness;Decreased knowledge of use of DME or AE;Cardiopulmonary status limiting activity;Pain  OT Plan  OT Frequency (ACUTE ONLY) Min 3X/week  OT Treatment/Interventions (ACUTE ONLY) Self-care/ADL training;Therapeutic exercise;Neuromuscular education;Energy conservation;DME and/or AE  instruction;Therapeutic activities;Patient/family education;Balance training  AM-PAC OT "6 Clicks" Daily Activity Outcome Measure (Version 2)  Help from another person eating meals? 4  Help from another person taking care of personal grooming? 3  Help from another person toileting, which includes using toliet, bedpan, or urinal? 3  Help from another person bathing (including washing, rinsing, drying)? 3  Help from another person to put on and taking off regular upper body clothing? 3  Help from another person to put on and taking off regular lower body clothing? 3  6 Click Score 19  OT Recommendation  Follow Up Recommendations Home health OT;Supervision - Intermittent  OT Equipment 3 in 1 bedside commode (for use in shower)  Acute Rehab OT Goals  Patient Stated Goal To go home  Time For Goal Achievement 01/15/19  Potential to Achieve Goals Good  OT Time Calculation  OT Start Time (ACUTE ONLY) 0901  OT Stop Time (ACUTE ONLY) 0935  OT Time Calculation (min) 34 min  OT General Charges  $OT Visit 1 Visit  OT Evaluation  $OT Eval Moderate Complexity 1 Mod  OT Treatments  $Self Care/Home Management  8-22 mins  Written Expression  Dominant Hand Right    Mauri Brooklyn OTR/L [phone]

## 2019-01-01 NOTE — Progress Notes (Signed)
Spoke with Margaretmary Bayley regarding the patient's condition.  Answered all questions at this time.

## 2019-01-01 NOTE — Progress Notes (Signed)
PHARMACY CONSULT: Lovenox for VTE prophylaxis   Wt: 72.5 kg BMI:  29 Scr:  0.63 CrCl >30 ml/hr  H/H: 11/36.5 Pltc: 269 D-dimer < 5  A/P:  Appropriate for standard dose Lovenox 40mg  SQ q24h No dose adjustments anticipated, Pharmacy will sign-off   Peggyann Juba, PharmD, [facility]: [phone] 4:02 PM 01/01/2019

## 2019-01-02 ENCOUNTER — Inpatient Hospital Stay (HOSPITAL_COMMUNITY): Payer: Medicaid Other

## 2019-01-02 DIAGNOSIS — I5042 Chronic combined systolic (congestive) and diastolic (congestive) heart failure: Secondary | ICD-10-CM

## 2019-01-02 DIAGNOSIS — J9601 Acute respiratory failure with hypoxia: Secondary | ICD-10-CM

## 2019-01-02 DIAGNOSIS — Z765 Malingerer [conscious simulation]: Secondary | ICD-10-CM

## 2019-01-02 LAB — COMPREHENSIVE METABOLIC PANEL
ALT: 34 U/L (ref 0–44)
AST: 49 U/L — ABNORMAL HIGH (ref 15–41)
Albumin: 2 g/dL — ABNORMAL LOW (ref 3.5–5.0)
Alkaline Phosphatase: 185 U/L — ABNORMAL HIGH (ref 38–126)
Anion gap: 12 (ref 5–15)
BUN: 24 mg/dL — ABNORMAL HIGH (ref 6–20)
CO2: 32 mmol/L (ref 22–32)
Calcium: 7.9 mg/dL — ABNORMAL LOW (ref 8.9–10.3)
Chloride: 97 mmol/L — ABNORMAL LOW (ref 98–111)
Creatinine, Ser: 0.74 mg/dL (ref 0.44–1.00)
GFR calc Af Amer: >60 mL/min (ref >60)
GFR calc non Af Amer: >60 mL/min (ref >60)
Glucose, Bld: 90 mg/dL (ref 70–99)
Potassium: 3.1 mmol/L — ABNORMAL LOW (ref 3.5–5.1)
Sodium: 141 mmol/L (ref 135–145)
Total Bilirubin: 0.4 mg/dL (ref 0.3–1.2)
Total Protein: 5.5 g/dL — ABNORMAL LOW (ref 6.5–8.1)

## 2019-01-02 LAB — GLUCOSE, CAPILLARY
Glucose-Capillary: 182 mg/dL — ABNORMAL HIGH (ref 70–99)
Glucose-Capillary: 113 mg/dL — ABNORMAL HIGH (ref 70–99)
Glucose-Capillary: 116 mg/dL — ABNORMAL HIGH (ref 70–99)
Glucose-Capillary: 127 mg/dL — ABNORMAL HIGH (ref 70–99)
Glucose-Capillary: 144 mg/dL — ABNORMAL HIGH (ref 70–99)

## 2019-01-02 LAB — CBC WITH DIFFERENTIAL/PLATELET
Abs Immature Granulocytes: 0.51 10*3/uL — ABNORMAL HIGH (ref 0.00–0.07)
Basophils Absolute: 0.1 10*3/uL (ref 0.0–0.1)
Basophils Relative: 0 %
Eosinophils Absolute: 0 10*3/uL (ref 0.0–0.5)
Eosinophils Relative: 0 %
HCT: 36.4 % (ref 36.0–46.0)
Hemoglobin: 10.8 g/dL — ABNORMAL LOW (ref 12.0–15.0)
Immature Granulocytes: 5 %
Lymphocytes Relative: 9 %
Lymphs Abs: 1 10*3/uL (ref 0.7–4.0)
MCH: 24.9 pg — ABNORMAL LOW (ref 26.0–34.0)
MCHC: 29.7 g/dL — ABNORMAL LOW (ref 30.0–36.0)
MCV: 84.1 fL (ref 80.0–100.0)
Monocytes Absolute: 0.4 10*3/uL (ref 0.1–1.0)
Monocytes Relative: 4 %
Neutro Abs: 9.4 10*3/uL — ABNORMAL HIGH (ref 1.7–7.7)
Neutrophils Relative %: 82 %
Platelets: 283 10*3/uL (ref 150–400)
RBC: 4.33 MIL/uL (ref 3.87–5.11)
RDW: 15.8 % — ABNORMAL HIGH (ref 11.5–15.5)
WBC: 11.4 10*3/uL — ABNORMAL HIGH (ref 4.0–10.5)
nRBC: 0.3 % — ABNORMAL HIGH (ref 0.0–0.2)

## 2019-01-02 LAB — ECHOCARDIOGRAM COMPLETE
Height: 61.969 in
Weight: 2557.34 oz

## 2019-01-02 LAB — C-REACTIVE PROTEIN: CRP: 1.7 mg/dL — ABNORMAL HIGH (ref <1.0)

## 2019-01-02 LAB — FERRITIN: Ferritin: 76 ng/mL (ref 11–307)

## 2019-01-02 LAB — MAGNESIUM: Magnesium: 2 mg/dL (ref 1.7–2.4)

## 2019-01-02 LAB — PHOSPHORUS: Phosphorus: 2.6 mg/dL (ref 2.5–4.6)

## 2019-01-02 LAB — D-DIMER, QUANTITATIVE: D-Dimer, Quant: 2.32 ug/mL-FEU — ABNORMAL HIGH (ref 0.00–0.50)

## 2019-01-02 MED ORDER — OXYCODONE HCL 5 MG PO TABS
10.0000 mg | ORAL_TABLET | Freq: Once | ORAL | Status: AC
  Administered 2019-01-02: 10 mg via ORAL

## 2019-01-02 MED ORDER — LIDOCAINE VISCOUS HCL 2 % MT SOLN: 15.0000 mL | Freq: Once | OROMUCOSAL | Status: DC

## 2019-01-02 MED ORDER — LIDOCAINE VISCOUS HCL 2 % MT SOLN
15.0000 mL | Freq: Four times a day (QID) | OROMUCOSAL | Status: DC | PRN
  Filled 2019-01-02: qty 15

## 2019-01-02 MED ORDER — ALUM & MAG HYDROXIDE-SIMETH 200-200-20 MG/5ML PO SUSP: 30.0000 mL | Freq: Four times a day (QID) | ORAL | Status: DC | PRN

## 2019-01-02 MED ORDER — KETOROLAC TROMETHAMINE 15 MG/ML IJ SOLN
15.0000 mg | Freq: Four times a day (QID) | INTRAMUSCULAR | Status: DC | PRN
  Administered 2019-01-02 – 2019-01-03 (×2): 15 mg via INTRAVENOUS
  Filled 2019-01-02 (×3): qty 1

## 2019-01-02 MED ORDER — ALUM & MAG HYDROXIDE-SIMETH 200-200-20 MG/5ML PO SUSP
30.0000 mL | Freq: Four times a day (QID) | ORAL | Status: DC | PRN
  Administered 2019-01-12 – 2019-01-18 (×3): 30 mL via ORAL
  Filled 2019-01-02 (×4): qty 30

## 2019-01-02 MED ORDER — POTASSIUM CHLORIDE 10 MEQ/100ML IV SOLN
10.0000 meq | INTRAVENOUS | Status: AC
  Administered 2019-01-02 (×3): 10 meq via INTRAVENOUS
  Filled 2019-01-02 (×3): qty 100

## 2019-01-02 MED ORDER — POTASSIUM CHLORIDE CRYS ER 20 MEQ PO TBCR
50.0000 meq | EXTENDED_RELEASE_TABLET | Freq: Once | ORAL | Status: AC
  Administered 2019-01-02: 50 meq via ORAL
  Filled 2019-01-02: qty 3

## 2019-01-02 MED ORDER — DULOXETINE HCL 30 MG PO CPEP
30.0000 mg | ORAL_CAPSULE | Freq: Every day | ORAL | Status: DC
  Administered 2019-01-02 – 2019-01-18 (×17): 30 mg via ORAL
  Filled 2019-01-02 (×19): qty 1

## 2019-01-02 MED ORDER — SODIUM CHLORIDE 0.9 % IV SOLN
100.0000 mg | Freq: Once | INTRAVENOUS | Status: AC
  Administered 2019-01-03: 100 mg via INTRAVENOUS
  Filled 2019-01-02: qty 20

## 2019-01-02 NOTE — Progress Notes (Signed)
Physical Therapy Treatment Patient Details Name: Emily Thomas MRN: [id number] DOB: 1960/12/23 Today's Date: 01/02/2019    History of Present Illness 58 y.o. female with medical history significant of chronic back pain due to degenerative disc disease, DM-2, HTN, history of remote pulmonary embolism no longer on anticoagulation, CAD s/p PCI approximately 1 year back-chronic epigastric pain-being worked up in the outpatient setting (EGD/colonoscopy scheduled)-presenting as a transfer from [place] for evaluation of acute hypoxic respiratory failure in the setting of COVID-19 pneumonia.    PT Comments    Pt does well with mobility in short bursts, during this tx session she was c/o severe pain in abdomen. Pt was needing SBA-mod I with most mobility, some minor balance deficits still exists also decreased activity tolerance. Pt was on 6L/min via [place] and was able to maintain sats in 90s with all mobility and activity performed. Major limitation at this time is pain and perception of pain.   Follow Up Recommendations  Home health PT(but pt has been denied HHPT)     Equipment Recommendations  None recommended by PT    Recommendations for Other Services       Precautions / Restrictions Precautions Precautions: Fall Restrictions Weight Bearing Restrictions: No    Mobility  Bed Mobility Overal bed mobility: Modified Independent                Transfers Overall transfer level: Modified independent Equipment used: None Transfers: Sit to/from Stand Sit to Stand: Modified independent (Device/Increase time)            Ambulation/Gait Ambulation/Gait assistance: Supervision Gait Distance (Feet): 48 Feet Assistive device: None Gait Pattern/deviations: Step-through pattern Gait velocity: decreased   General Gait Details: decreased cadence but functional for short distances   Stairs             Wheelchair Mobility    Modified Rankin (Stroke Patients  Only)       Balance Overall balance assessment: Mild deficits observed, not formally tested                                          Cognition Arousal/Alertness: Lethargic Behavior During Therapy: Restless;Agitated Overall Cognitive Status: No family/caregiver present to determine baseline cognitive functioning                                        Exercises      General Comments General comments (skin integrity, edema, etc.): pt c/o severe pain in abdomen during session but was able to complete all tasks given, physician was in room to assess pt prior to mobility commencement      Pertinent Vitals/Pain Pain Assessment: 0-10 Pain Score: 10-Worst pain ever Pain Location: in abdomen Pain Descriptors / Indicators: Constant Pain Intervention(s): Limited activity within patient's tolerance;Monitored during session    Home Living                      Prior Function            PT Goals (current goals can now be found in the care plan section) Acute Rehab PT Goals Patient Stated Goal: states that has pain in abdomen today PT Goal Formulation: With patient Time For Goal Achievement: 01/14/19 Potential to Achieve Goals: Fair Progress towards PT goals: Progressing  toward goals    Frequency    Min 3X/week      PT Plan Discharge plan needs to be updated    Co-evaluation              AM-PAC PT "6 Clicks" Mobility   Outcome Measure  Help needed turning from your back to your side while in a flat bed without using bedrails?: None Help needed moving from lying on your back to sitting on the side of a flat bed without using bedrails?: None Help needed moving to and from a bed to a chair (including a wheelchair)?: A Little Help needed standing up from a chair using your arms (e.g., wheelchair or bedside chair)?: A Little Help needed to walk in hospital room?: A Little Help needed climbing 3-5 steps with a railing? : A  Lot 6 Click Score: 19    End of Session Equipment Utilized During Treatment: Oxygen Activity Tolerance: Patient limited by fatigue;Patient limited by lethargy;Patient limited by pain Patient left: in bed;with call bell/phone within reach;with nursing/sitter in room Nurse Communication: Mobility status PT Visit Diagnosis: Other abnormalities of gait and mobility (R26.89);History of falling (Z91.81)     Time: 1436-1500 PT Time Calculation (min) (ACUTE ONLY): 24 min  Charges:  $Gait Training: 8-22 mins $Therapeutic Activity: 8-22 mins                     Emily Thomas, PT    Emily Thomas 01/02/2019, 3:25 PM

## 2019-01-02 NOTE — TOC Initial Note (Signed)
Transition of Care [facility]) - Initial/Assessment Note    Patient Details  Name: Emily Thomas MRN: [id number] Date of Birth: 1960-02-05  [facility] CM/SW Contact:    Ninfa Meeker, RN Phone Number: [phone] (working remotely) 01/02/2019, 8:30 AM  Clinical Narrative:  Case manager acknowledged order for [facility] to be arranged. Unfortunately after contacting [facility] agency that is providing [facility] coverage, patient still will not be able to have [facility] services. They have maxed out with [facility]. Patient has Medicaid and no one is accepting that as a payor. Patient is from home and has some family support. Case manager notified MD.                       Patient Goals and CMS Choice        Expected Discharge Plan and Services                                                Prior Living Arrangements/Services                       Activities of Daily Living Home Assistive Devices/Equipment: None ADL Screening (condition at time of admission) Patient's cognitive ability adequate to safely complete daily activities?: Yes Is the patient deaf or have difficulty hearing?: No Does the patient have difficulty seeing, even when wearing glasses/contacts?: No Does the patient have difficulty concentrating, remembering, or making decisions?: No Patient able to express need for assistance with ADLs?: Yes Does the patient have difficulty dressing or bathing?: No Independently performs ADLs?: Yes (appropriate for developmental age) Does the patient have difficulty walking or climbing stairs?: No Weakness of Legs: None Weakness of Arms/Hands: None  Permission Sought/Granted                  Emotional Assessment              Admission diagnosis:  Acute respiratory failure with hypoxemia ([place]) [J96.01] Patient Active Problem List   Diagnosis Date Noted  . Chronic systolic CHF (congestive heart failure) ([place]) 01/01/2019  . CAD (coronary artery  disease) 01/01/2019  . Essential hypertension 01/01/2019  . Diabetes mellitus type 2, uncontrolled, with complications ([place]) 57/26/2035  . Chronic epigastric pain 01/01/2019  . Tobacco abuse 01/01/2019  . Acute respiratory failure with hypoxemia ([place]) 12/30/2018  . Pneumonia due to COVID-19 virus 12/30/2018  . HTN (hypertension) 12/30/2018   PCP:  Patient, No Pcp Per Pharmacy:   [place], [place] - [place] [address] [place] [address] Phone: [phone] Fax: [phone]     Social Determinants of Health (SDOH) Interventions    Readmission Risk Interventions No flowsheet data found.

## 2019-01-02 NOTE — Plan of Care (Signed)
  Problem: Education: Goal: Knowledge of risk factors and measures for prevention of condition will improve 01/02/2019 0610 by Delcie Roch, Inocencia Murtaugh, RN Outcome: Progressing 01/02/2019 0610 by Izell [place], RN Outcome: Progressing   Problem: Coping: Goal: Psychosocial and spiritual needs will be supported 01/02/2019 0610 by Delcie Roch, [place], RN Outcome: Progressing 01/02/2019 0610 by Izell [place], RN Outcome: Progressing   Problem: Respiratory: Goal: Will maintain a patent airway 01/02/2019 0610 by Izell Micro, RN Outcome: Progressing 01/02/2019 0610 by Izell [place], RN Outcome: Progressing Goal: Complications related to the disease process, condition or treatment will be avoided or minimized 01/02/2019 0610 by Izell [place], RN Outcome: Progressing 01/02/2019 0610 by Izell , RN Outcome: Progressing

## 2019-01-02 NOTE — Progress Notes (Signed)
PROGRESS NOTE    Emily Thomas  OIN:[id number] DOB: 1960/08/22 DOA: 12/30/2018 PCP: Patient, No Pcp Per   Brief Narrative:  Emily Thomas is a 58 y.o. WF PMHx tobacco abuse, chronic back pain due to degenerative disc disease, diabetes type 2 uncontrolled with complication, HTN,CAD s/p PCI approximately 1 year back-chronic, Hx remote pulmonary embolism no longer on anticoagulation, epigastric pain-being worked up in the outpatient setting (EGD/colonoscopy scheduled), Hx gastric ulcer, Hx hiatal hernia  Presenting as a transfer from [place] for evaluation of acute hypoxic respiratory failure in the setting of COVID-19 pneumonia.  Per patient, for the past 1 month or so-she has had worsening chronic back pain-and epigastric pain.  She apparently recently saw her primary care/GI MD-and was prepped for colonoscopy/endoscopy-however due to poor bowel preparation-endoscopy evaluation could not be completed.  She claims that since December 15-after she completed a bowel prep for colonoscopy-she started feeling fatigue with generalized myalgias.  She then started developing shortness of breath that has gradually progressed over the past few days.  She presented to [facility] she had severe exertional dyspnea and could hardly walk a few feet.  At [place] emergency room-patient was found to have pneumonia-her COVID-19 swab was positive-and she was requiring around 5 L to maintain O2 saturations above 90.  Subsequently case was discussed with the prior hospitalist at [facility] patient was accepted as a transfer  Patient denies any fever, nausea, vomiting or diarrhea.   Subjective: 12/31 last 24 hours afebrile.  States abdominal pain 10/10.  States in the past was given morphine for abdominal pain.  States positive hematemesis (RN not aware), when asked to show me into a cup unable.     Assessment & Plan:   Principal Problem:   Acute respiratory  failure with hypoxemia (HCC) Active Problems:   Pneumonia due to COVID-19 virus   HTN (hypertension)   Chronic systolic CHF (congestive heart failure) (HCC)   CAD (coronary artery disease)   Essential hypertension   Diabetes mellitus type 2, uncontrolled, with complications (HCC)   Chronic epigastric pain   Tobacco abuse   Drug-seeking behavior  Covid pneumonia/acute respiratory failure with hypoxia COVID-19 Labs  Recent Labs    12/31/18 0425 01/01/19 0253 01/02/19 0240  DDIMER 2.24* 2.64* 2.32*  FERRITIN 68 85 76  CRP 3.6* 2.8* 1.7*    No results found for: SARSCOV2NAA -Decadron 6 mg daily -Remdesivir per pharmacy protocol -Combivent -Titrate O2 to maintain SPO2> 88% -Flutter valve -Incentive spirometer -Prone patient 16 hours/day; if patient cannot stand that length of time prone to 3 hours every shift  Chronic systolic and diastolic CHF -Unknown based weight-Strict ins and outs -2.5 L -Daily weight Filed Weights   12/30/18 1953 01/01/19 1253 01/02/19 0500  Weight: 72.5 kg 72.3 kg 72.5 kg  -12/30 echocardiogram; shows systolic and diastolic CHF see results below -Lasix IV 40 mg BID -Lisinopril 10 mg daily -Toprol 50 mg daily  Hx CAD s/p PCI -ASA 81 mg daily -Plavix. 75 mg daily  HTN -See CHF  Diabetes type 2 uncontrolled with complication* -70/96 hemoglobin A1c= 7.2 -Sensitive SSI -12/30 Lantus 5 units daily  Epigastric pain (chronic) -Currently being worked up as an outpatient -Hx gastric ulcer and hiatal hernia.:  -Abdomen currently soft, nontender palpation -12/31 DC Maalox--> GI cocktail PRN -Carafate 1 gTID -Patient NOT TO be ADMINISTERED morphine  Chronic back pain -Has been on methadone and oxycodone in the past. -Current pain regimen -Toradol 15 mg PRN  Benzodiazepine abuse? -Staff has observed that patient arrived with a new bottle of lorazepam and 20 tablets have been used within 24 hours. -Monitor closely for  withdrawal  Drug-seeking behavior -Be very judicious in use of benzodiazepine and narcotics  Remote history of VTE (pulmonary embolism): -Start prophylactic Lovenox-we will resume aspirin/Plavix. Encourage ambulation with PT.  Anxiety -Ativan PRN -12/31 Cymbalta 30 mg daily  History of tobacco abuse: -Smokes 2 packs/day stopped approximately 3 weeks ago when she became ill.    Hypokalemia -Potassium goal> 4 -K-Dur 50 mEq + potassium IV 30 mEq  Hypophosphatemia  Hypomagnesmia -Magnesium goal> 2     DVT prophylaxis: Lovenox Code Status: Full Family Communication:  Disposition Plan: Discharge 1/1   Consultants:    Procedures/Significant Events:  12/31 echocardiogram;Left Ventricle: EF= 35 to 40%. The left ventricle has severely decreased function. The left ventricle demonstrates global hypokinesis.  -Left ventricular diastolic parameters are consistent with Grade I diastolic dysfunction    I have personally reviewed and interpreted all radiology studies and my findings are as above.  VENTILATOR SETTINGS: Nasal cannula 12/31 Flow; 4 L/min SPO2; 94%   Cultures 12/28 HIV screen negative     Antimicrobials: Anti-infectives (From admission, onward)   Start     Dose/Rate Stop   12/31/18 1000  remdesivir 100 mg in sodium chloride 0.9 % 100 mL IVPB  Status:  Discontinued     100 mg 200 mL/hr over 30 Minutes 12/30/18 1432   12/31/18 1000  remdesivir 100 mg in sodium chloride 0.9 % 100 mL IVPB     100 mg 200 mL/hr over 30 Minutes 01/03/19 0959   12/30/18 1500  azithromycin (ZITHROMAX) 500 mg in sodium chloride 0.9 % 250 mL IVPB     500 mg 250 mL/hr over 60 Minutes 01/02/19 1459   12/30/18 1415  cefTRIAXone (ROCEPHIN) 1 g in sodium chloride 0.9 % 100 mL IVPB     1 g 200 mL/hr over 30 Minutes 01/04/19 1414   12/30/18 1400  cefTRIAXone (ROCEPHIN) injection 1 g  Status:  Discontinued     1 g 12/30/18 1400   12/30/18 1200  remdesivir 200 mg in sodium  chloride 0.9% 250 mL IVPB     200 mg 580 mL/hr over 30 Minutes 12/30/18 1306       Devices    LINES / TUBES:     Continuous Infusions: . sodium chloride    . cefTRIAXone (ROCEPHIN)  IV 1 g (01/02/19 1344)  . potassium chloride 10 mEq (01/02/19 1706)  . [START ON 01/03/2019] remdesivir 100 mg in NS 100 mL       Objective: Vitals:   01/02/19 0500 01/02/19 0720 01/02/19 1135 01/02/19 1538  BP:  (!) 154/91 133/83 125/81  Pulse:  73 90 88  Resp:  13 17 15   Temp:  98.1 F (36.7 C) 97.6 F (36.4 C) 98.2 F (36.8 C)  TempSrc:  Oral Oral Oral  SpO2:  91% 93% 95%  Weight: 72.5 kg     Height:        Intake/Output Summary (Last 24 hours) at 01/02/2019 1749 Last data filed at 01/02/2019 1617 Gross per 24 hour  Intake 820 ml  Output 400 ml  Net 420 ml   Filed Weights   12/30/18 1953 01/01/19 1253 01/02/19 0500  Weight: 72.5 kg 72.3 kg 72.5 kg   Physical Exam:  General: A/O x4, positive acute respiratory distress Eyes: negative scleral hemorrhage, negative anisocoria, negative icterus ENT: Negative Runny nose, negative gingival  bleeding, Neck:  Negative scars, masses, torticollis, lymphadenopathy, JVD Lungs: Clear to auscultation bilaterally without wheezes or crackles Cardiovascular: Regular rate and rhythm without murmur gallop or rub normal S1 and S2 Abdomen: negative abdominal pain to palpation, nondistended, positive soft, bowel sounds, no rebound, no ascites, no appreciable mass Extremities: No significant cyanosis, clubbing, or edema bilateral lower extremities Skin: Negative rashes, lesions, ulcers Psychiatric:  Negative depression, positive anxiety, negative fatigue, negative mania  Central nervous system:  Cranial nerves II through XII intact, tongue/uvula midline, all extremities muscle strength 5/5, sensation intact throughout, negative dysarthria, negative expressive aphasia, negative receptive aphasia.  .     Data Reviewed: Care during the described  time interval was provided by me .  I have reviewed this patient's available data, including medical history, events of note, physical examination, and all test results as part of my evaluation.   CBC: Recent Labs  Lab 12/30/18 1110 12/31/18 0425 01/01/19 0253 01/02/19 0240  WBC 9.0 15.2* 11.7* 11.4*  NEUTROABS 8.1* 13.4* 10.1* 9.4*  HGB 10.4* 11.5* 11.0* 10.8*  HCT 35.0* 38.9 36.5 36.4  MCV 86.2 84.4 84.3 84.1  PLT 248 367 269 601   Basic Metabolic Panel: Recent Labs  Lab 12/30/18 1110 12/31/18 0425 01/01/19 0253 01/02/19 0240  NA 141 141 140 141  K 3.3* 3.3* 3.3* 3.1*  CL 101 101 99 97*  CO2 28 27 29  32  GLUCOSE 155* 161* 147* 90  BUN 17 21* 21* 24*  CREATININE 0.72 0.62 0.63 0.74  CALCIUM 7.9* 8.2* 7.7* 7.9*  MG  --   --  1.6* 2.0  PHOS  --   --  2.0* 2.6   GFR: Estimated Creatinine Clearance: 70.5 mL/min (by C-G formula based on SCr of 0.74 mg/dL). Liver Function Tests: Recent Labs  Lab 12/30/18 1110 12/31/18 0425 01/01/19 0253 01/02/19 0240  AST 36 38 41 49*  ALT 28 30 30  34  ALKPHOS 160* 196* 175* 185*  BILITOT 0.3 0.5 0.4 0.4  PROT 5.6* 6.2* 5.8* 5.5*  ALBUMIN 1.8* 2.1* 1.9* 2.0*   No results for input(s): LIPASE, AMYLASE in the last 168 hours. No results for input(s): AMMONIA in the last 168 hours. Coagulation Profile: No results for input(s): INR, PROTIME in the last 168 hours. Cardiac Enzymes: No results for input(s): CKTOTAL, CKMB, CKMBINDEX, TROPONINI in the last 168 hours. BNP (last 3 results) No results for input(s): PROBNP in the last 8760 hours. HbA1C: No results for input(s): HGBA1C in the last 72 hours. CBG: Recent Labs  Lab 01/01/19 1632 01/01/19 2041 01/02/19 0734 01/02/19 1133 01/02/19 1535  GLUCAP 182* 198* 113* 116* 127*   Lipid Profile: No results for input(s): CHOL, HDL, LDLCALC, TRIG, CHOLHDL, LDLDIRECT in the last 72 hours. Thyroid Function Tests: No results for input(s): TSH, T4TOTAL, FREET4, T3FREE, THYROIDAB in the  last 72 hours. Anemia Panel: Recent Labs    01/01/19 0253 01/02/19 0240  FERRITIN 85 76   Urine analysis: No results found for: COLORURINE, APPEARANCEUR, LABSPEC, PHURINE, GLUCOSEU, HGBUR, BILIRUBINUR, KETONESUR, PROTEINUR, UROBILINOGEN, NITRITE, LEUKOCYTESUR Sepsis Labs: @LABRCNTIP (procalcitonin:4,lacticidven:4)  )No results found for this or any previous visit (from the past 240 hour(s)).       Radiology Studies: ECHOCARDIOGRAM COMPLETE  Result Date: 01/02/2019   ECHOCARDIOGRAM REPORT   Patient Name:   ARNISHA LAFFOON [facility] Date of Exam: 01/02/2019 Medical Rec #:  [id number]      Height:       62.0 in Accession #:    [id number]  Weight:       159.8 lb Date of Birth:  07-Jan-1960     BSA:          1.74 m Patient Age:    53 years       BP:           154/91 mmHg Patient Gender: F              HR:           65 bpm. Exam Location:  Inpatient Procedure: 2D Echo, Color Doppler and Cardiac Doppler Indications:    R07.9* Chest pain, unspecified  History:        Patient has no prior history of Echocardiogram examinations.                 CHF, CAD; Risk Factors:Hypertension and Diabetes. Verbal history                 of Echocardiogram in [facility].  Sonographer:    Raquel Sarna Senior RDCS Referring Phys: [id number] Brenda  Sonographer Comments: Technically difficult study due to poor echo windows. Inpatient at [place]  1. Left ventricular ejection fraction, by visual estimation, is 35 to 40%. The left ventricle has severely decreased function. There is mildly increased left ventricular hypertrophy.  2. Left ventricular diastolic parameters are consistent with Grade I diastolic dysfunction (impaired relaxation).  3. The left ventricle demonstrates global hypokinesis.  4. Global right ventricle has low normal systolic function.The right ventricular size is normal. No increase in right ventricular wall thickness.  5. Left atrial size was normal.  6. Right atrial size was normal.  7. The  mitral valve is abnormal. Trivial mitral valve regurgitation.  8. The tricuspid valve is not well visualized.  9. The aortic valve was not well visualized. Aortic valve regurgitation is mild. 10. The pulmonic valve was not well visualized. Pulmonic valve regurgitation is not visualized. 11. The inferior vena cava is normal in size with greater than 50% respiratory variability, suggesting right atrial pressure of 3 mmHg. 12. The interatrial septum was not well visualized. FINDINGS  Left Ventricle: Left ventricular ejection fraction, by visual estimation, is 35 to 40%. The left ventricle has severely decreased function. The left ventricle demonstrates global hypokinesis. There is mildly increased left ventricular hypertrophy. Left ventricular diastolic parameters are consistent with Grade I diastolic dysfunction (impaired relaxation). Indeterminate filling pressures. Right Ventricle: The right ventricular size is normal. No increase in right ventricular wall thickness. Global RV systolic function is has low normal systolic function. Left Atrium: Left atrial size was normal in size. Right Atrium: Right atrial size was normal in size Pericardium: There is no evidence of pericardial effusion. Mitral Valve: The mitral valve is abnormal. There is mild thickening of the mitral valve leaflet(s). Trivial mitral valve regurgitation. Tricuspid Valve: The tricuspid valve is not well visualized. Tricuspid valve regurgitation is trivial. Aortic Valve: The aortic valve was not well visualized. Aortic valve regurgitation is mild. Aortic regurgitation PHT measures 1207 msec. Pulmonic Valve: The pulmonic valve was not well visualized. Pulmonic valve regurgitation is not visualized. Pulmonic regurgitation is not visualized. Aorta: The aortic root and ascending aorta are structurally normal, with no evidence of dilitation. Venous: The inferior vena cava is normal in size with greater than 50% respiratory variability, suggesting right  atrial pressure of 3 mmHg. IAS/Shunts: The interatrial septum was not well visualized.  LEFT VENTRICLE PLAX 2D LVIDd:         4.22 cm  Diastology LVIDs:         3.67 cm       LV e' lateral:   4.13 cm/s LV PW:         0.96 cm       LV E/e' lateral: 13.0 LV IVS:        1.06 cm       LV e' medial:    3.92 cm/s LVOT diam:     1.90 cm       LV E/e' medial:  13.7 LV SV:         22 ml LV SV Index:   12.45 LVOT Area:     2.84 cm  LV Volumes (MOD) LV area d, A2C:    36.60 cm LV area d, A4C:    40.90 cm LV area s, A2C:    27.00 cm LV area s, A4C:    30.90 cm LV major d, A2C:   8.77 cm LV major d, A4C:   9.01 cm LV major s, A2C:   8.11 cm LV major s, A4C:   8.24 cm LV vol d, MOD A2C: 127.0 ml LV vol d, MOD A4C: 153.0 ml LV vol s, MOD A2C: 76.9 ml LV vol s, MOD A4C: 98.9 ml LV SV MOD A2C:     50.1 ml LV SV MOD A4C:     153.0 ml LV SV MOD BP:      53.5 ml RIGHT VENTRICLE RV S prime:     13.20 cm/s TAPSE (M-mode): 2.6 cm LEFT ATRIUM             Index       RIGHT ATRIUM           Index LA diam:        3.30 cm 1.90 cm/m  RA Area:     10.10 cm LA Vol (A2C):   48.1 ml 27.69 ml/m RA Volume:   19.80 ml  11.40 ml/m LA Vol (A4C):   57.2 ml 32.93 ml/m LA Biplane Vol: 53.5 ml 30.80 ml/m  AORTIC VALVE LVOT Vmax:   119.00 cm/s LVOT Vmean:  79.200 cm/s LVOT VTI:    0.200 m AI PHT:      1207 msec  AORTA Ao Root diam: 3.10 cm Ao Asc diam:  3.20 cm MITRAL VALVE MV Area (PHT): 3.65 cm             SHUNTS MV PHT:        60.32 msec           Systemic VTI:  0.20 m MV Decel Time: 208 msec             Systemic Diam: 1.90 cm MV E velocity: 53.80 cm/s 103 cm/s MV A velocity: 64.00 cm/s 70.3 cm/s MV E/A ratio:  0.84       1.5  Lyman Bishop MD Electronically signed by Lyman Bishop MD Signature Date/Time: 01/02/2019/11:02:11 AM    Final         Scheduled Meds: . vitamin C  500 mg Oral Daily  . aspirin  81 mg Oral Daily  . clopidogrel  75 mg Oral Daily  . dexamethasone (DECADRON) injection  6 mg Intravenous Q24H  . enoxaparin  (LOVENOX) injection  40 mg Subcutaneous Q24H  . famotidine  20 mg Oral Daily  . furosemide  40 mg Intravenous BID  . insulin aspart  0-9 Units Subcutaneous TID WC  . insulin glargine  5 Units Subcutaneous Daily  . Ipratropium-Albuterol  1 puff Inhalation Q6H  . lisinopril  10 mg Oral Daily  . metoprolol succinate  50 mg Oral Daily  . pantoprazole  40 mg Oral BID  . polyethylene glycol  17 g Oral Daily  . senna-docusate  2 tablet Oral QHS  . sodium chloride flush  3 mL Intravenous Q12H  . sucralfate  1 g Oral TID AC & HS  . zinc sulfate  220 mg Oral Daily   Continuous Infusions: . sodium chloride    . cefTRIAXone (ROCEPHIN)  IV 1 g (01/02/19 1344)  . potassium chloride 10 mEq (01/02/19 1706)  . [START ON 01/03/2019] remdesivir 100 mg in NS 100 mL       LOS: 3 days   The patient is critically ill with multiple organ systems failure and requires high complexity decision making for assessment and support, frequent evaluation and titration of therapies, application of advanced monitoring technologies and extensive interpretation of multiple databases. Critical Care Time devoted to patient care services described in this note  Time spent: 40 minutes     Herrick Hartog, Geraldo Docker, MD [facility] Pager [phone]  If 7PM-7AM, please contact night-coverage [email or web address] Password [facility] 01/02/2019, 5:49 PM

## 2019-01-02 NOTE — Progress Notes (Signed)
Spoke with Margaretmary Bayley and updated her on the Patient's condition.  Answered all questions at this time.

## 2019-01-02 NOTE — Progress Notes (Signed)
Echocardiogram 2D Echocardiogram has been performed.  Oneal Deputy Lynnea Vandervoort 01/02/2019, 8:25 AM

## 2019-01-03 ENCOUNTER — Inpatient Hospital Stay (HOSPITAL_COMMUNITY): Payer: Medicaid Other

## 2019-01-03 DIAGNOSIS — K859 Acute pancreatitis without necrosis or infection, unspecified: Secondary | ICD-10-CM | POA: Diagnosis present

## 2019-01-03 DIAGNOSIS — C799 Secondary malignant neoplasm of unspecified site: Secondary | ICD-10-CM | POA: Diagnosis present

## 2019-01-03 LAB — COMPREHENSIVE METABOLIC PANEL
ALT: 33 U/L (ref 0–44)
AST: 58 U/L — ABNORMAL HIGH (ref 15–41)
Albumin: 1.8 g/dL — ABNORMAL LOW (ref 3.5–5.0)
Alkaline Phosphatase: 215 U/L — ABNORMAL HIGH (ref 38–126)
Anion gap: 12 (ref 5–15)
BUN: 30 mg/dL — ABNORMAL HIGH (ref 6–20)
CO2: 33 mmol/L — ABNORMAL HIGH (ref 22–32)
Calcium: 8 mg/dL — ABNORMAL LOW (ref 8.9–10.3)
Chloride: 98 mmol/L (ref 98–111)
Creatinine, Ser: 0.8 mg/dL (ref 0.44–1.00)
GFR calc Af Amer: >60 mL/min (ref >60)
GFR calc non Af Amer: >60 mL/min (ref >60)
Glucose, Bld: 73 mg/dL (ref 70–99)
Potassium: 3.5 mmol/L (ref 3.5–5.1)
Sodium: 143 mmol/L (ref 135–145)
Total Bilirubin: 0.6 mg/dL (ref 0.3–1.2)
Total Protein: 5.4 g/dL — ABNORMAL LOW (ref 6.5–8.1)

## 2019-01-03 LAB — CBC WITH DIFFERENTIAL/PLATELET
Abs Immature Granulocytes: 0.49 10*3/uL — ABNORMAL HIGH (ref 0.00–0.07)
Basophils Absolute: 0.1 10*3/uL (ref 0.0–0.1)
Basophils Relative: 1 %
Eosinophils Absolute: 0 10*3/uL (ref 0.0–0.5)
Eosinophils Relative: 0 %
HCT: 35.7 % — ABNORMAL LOW (ref 36.0–46.0)
Hemoglobin: 10.4 g/dL — ABNORMAL LOW (ref 12.0–15.0)
Immature Granulocytes: 5 %
Lymphocytes Relative: 10 %
Lymphs Abs: 1.1 10*3/uL (ref 0.7–4.0)
MCH: 25 pg — ABNORMAL LOW (ref 26.0–34.0)
MCHC: 29.1 g/dL — ABNORMAL LOW (ref 30.0–36.0)
MCV: 85.8 fL (ref 80.0–100.0)
Monocytes Absolute: 0.4 10*3/uL (ref 0.1–1.0)
Monocytes Relative: 3 %
Neutro Abs: 8.8 10*3/uL — ABNORMAL HIGH (ref 1.7–7.7)
Neutrophils Relative %: 81 %
Platelets: 269 10*3/uL (ref 150–400)
RBC: 4.16 MIL/uL (ref 3.87–5.11)
RDW: 15.9 % — ABNORMAL HIGH (ref 11.5–15.5)
WBC: 10.8 10*3/uL — ABNORMAL HIGH (ref 4.0–10.5)
nRBC: 0.2 % (ref 0.0–0.2)

## 2019-01-03 LAB — FERRITIN: Ferritin: 93 ng/mL (ref 11–307)

## 2019-01-03 LAB — GLUCOSE, CAPILLARY
Glucose-Capillary: 81 mg/dL (ref 70–99)
Glucose-Capillary: 169 mg/dL — ABNORMAL HIGH (ref 70–99)
Glucose-Capillary: 90 mg/dL (ref 70–99)
Glucose-Capillary: 115 mg/dL — ABNORMAL HIGH (ref 70–99)

## 2019-01-03 LAB — D-DIMER, QUANTITATIVE: D-Dimer, Quant: 1.85 ug/mL-FEU — ABNORMAL HIGH (ref 0.00–0.50)

## 2019-01-03 LAB — LIPASE, BLOOD: Lipase: 81 U/L — ABNORMAL HIGH (ref 11–51)

## 2019-01-03 LAB — AMYLASE: Amylase: 471 U/L — ABNORMAL HIGH (ref 28–100)

## 2019-01-03 LAB — MAGNESIUM: Magnesium: 2.1 mg/dL (ref 1.7–2.4)

## 2019-01-03 LAB — PHOSPHORUS: Phosphorus: 3.7 mg/dL (ref 2.5–4.6)

## 2019-01-03 LAB — C-REACTIVE PROTEIN: CRP: 3.8 mg/dL — ABNORMAL HIGH (ref <1.0)

## 2019-01-03 MED ORDER — POTASSIUM CHLORIDE 10 MEQ/100ML IV SOLN
10.0000 meq | INTRAVENOUS | Status: AC
  Administered 2019-01-03 (×3): 10 meq via INTRAVENOUS
  Filled 2019-01-03 (×3): qty 100

## 2019-01-03 MED ORDER — KETOROLAC TROMETHAMINE 15 MG/ML IJ SOLN
15.0000 mg | Freq: Three times a day (TID) | INTRAMUSCULAR | Status: AC | PRN
  Administered 2019-01-04 – 2019-01-06 (×4): 15 mg via INTRAVENOUS
  Filled 2019-01-03 (×5): qty 1

## 2019-01-03 MED ORDER — DEXTROSE-NACL 5-0.45 % IV SOLN
INTRAVENOUS | Status: DC
  Administered 2019-01-05: 75 mL via INTRAVENOUS

## 2019-01-03 MED ORDER — IOHEXOL 350 MG/ML SOLN
100.0000 mL | Freq: Once | INTRAVENOUS | Status: AC | PRN
  Administered 2019-01-03: 100 mL via INTRAVENOUS

## 2019-01-03 MED ORDER — MORPHINE SULFATE (PF) 2 MG/ML IV SOLN
2.0000 mg | INTRAVENOUS | Status: DC | PRN
  Administered 2019-01-03 – 2019-01-12 (×36): 2 mg via INTRAVENOUS
  Filled 2019-01-03 (×38): qty 1

## 2019-01-03 MED ORDER — FENTANYL 75 MCG/HR TD PT72
1.0000 | MEDICATED_PATCH | TRANSDERMAL | Status: AC
  Administered 2019-01-03 – 2019-01-06 (×2): 1 via TRANSDERMAL
  Filled 2019-01-03 (×2): qty 1

## 2019-01-03 MED ORDER — POTASSIUM CHLORIDE CRYS ER 20 MEQ PO TBCR
50.0000 meq | EXTENDED_RELEASE_TABLET | Freq: Once | ORAL | Status: AC
  Administered 2019-01-03: 50 meq via ORAL
  Filled 2019-01-03: qty 3

## 2019-01-03 NOTE — Progress Notes (Addendum)
PROGRESS NOTE    Emily Thomas  XFG:[id number] DOB: 1960/11/11 DOA: 12/30/2018 PCP: Patient, No Pcp Per   Brief Narrative:  Emily Thomas is a 59 y.o. WF PMHx tobacco abuse, chronic back pain due to degenerative disc disease, diabetes type 2 uncontrolled with complication, HTN,CAD s/p PCI approximately 1 year back-chronic, Hx remote pulmonary embolism no longer on anticoagulation, epigastric pain-being worked up in the outpatient setting (EGD/colonoscopy scheduled), Hx gastric ulcer, Hx hiatal hernia  Presenting as a transfer from [facility] for evaluation of acute hypoxic respiratory failure in the setting of COVID-19 pneumonia.  Per patient, for the past 1 month or so-she has had worsening chronic back pain-and epigastric pain.  She apparently recently saw her primary care/GI MD-and was prepped for colonoscopy/endoscopy-however due to poor bowel preparation-endoscopy evaluation could not be completed.  She claims that since December 15-after she completed a bowel prep for colonoscopy-she started feeling fatigue with generalized myalgias.  She then started developing shortness of breath that has gradually progressed over the past few days.  She presented to [facility] she had severe exertional dyspnea and could hardly walk a few feet.  At [place] emergency room-patient was found to have pneumonia-her COVID-19 swab was positive-and she was requiring around 5 L to maintain O2 saturations above 90.  Subsequently case was discussed with the prior hospitalist at [facility] patient was accepted as a transfer  Patient denies any fever, nausea, vomiting or diarrhea.   Subjective: 1/1 last 24 hours afebrile, A/O x4, positive abdominal pain she describes as being inside rated 10/10.  Positive nausea with consumption of food.   Assessment & Plan:   Principal Problem:   Acute respiratory failure with hypoxemia (HCC) Active Problems:   Pneumonia due  to COVID-19 virus   HTN (hypertension)   Chronic systolic CHF (congestive heart failure) (HCC)   CAD (coronary artery disease)   Essential hypertension   Diabetes mellitus type 2, uncontrolled, with complications (HCC)   Chronic epigastric pain   Tobacco abuse   Drug-seeking behavior   Acute pancreatitis   Metastatic cancer ([place])  Covid pneumonia/acute respiratory failure with hypoxia COVID-19 Labs  Recent Labs    01/01/19 0253 01/02/19 0240 01/03/19 0809  DDIMER 2.64* 2.32* 1.85*  FERRITIN 85 76 93  CRP 2.8* 1.7* 3.8*    No results found for: SARSCOV2NAA -Decadron 6 mg daily -Remdesivir per pharmacy protocol (last dose 1/1) -Combivent -Titrate O2 to maintain SPO2> 88% -Flutter valve -Incentive spirometer -Prone patient 16 hours/day; if patient cannot stand that length of time prone to 3 hours every shift  Chronic systolic and diastolic CHF -Unknown based weight-Strict ins and outs -5.6 L -Daily weight Filed Weights   12/30/18 1953 01/01/19 1253 01/02/19 0500  Weight: 72.5 kg 72.3 kg 72.5 kg  -12/30 echocardiogram; shows systolic and diastolic CHF see results below -Lasix IV 40 mg BID -Lisinopril 10 mg daily -Toprol 50 mg daily  Hx CAD s/p PCI -ASA 81 mg daily -Plavix. 75 mg daily  HTN -See CHF  Diabetes type 2 uncontrolled with complication* -96/78 hemoglobin A1c= 7.2 -Sensitive SSI -12/30 Lantus 5 units daily  Epigastric pain (chronic) -Hx gastric ulcer and hiatal hernia.:  -Abdomen currently soft, nontender palpation -12/31 DC Maalox--> GI cocktail PRN -Carafate 1 gTID -1/1see metastatic lung cancer and acute pancreatitis  Chronic back pain -Has been on methadone and oxycodone in the past. -Due to acute pancreatitis DC all  PO pain medication.   -1/1 fentanyl patch 75  mcg/h -Morphine 2 mg q4 PRN -Toradol 15 mg PRN  Metastatic right lower lobe lung cancer -Patient has large mass in liver, lung, kidney i.e. multiple places.  See CT -1/1  discussed case with Dr. Lamonte Sakai, and after completion of Covid treatment believes may be easier to obtain liver biopsy. -Consult IR for liver biopsy to confirm primary source of metastatic cancer. -1/1 Palliative Care consult placed  Acute pancreatitis -Elevated lipase and amylase -N.p.o. except meds -D5-0.45% saline 76ml/hr  Benzodiazepine abuse? -Staff has observed that patient arrived with a new bottle of lorazepam and 20 tablets have been used within 24 hours. -Monitor closely for withdrawal  Drug-seeking behavior -Be very judicious in use of benzodiazepine and narcotics -1/1 patient with metastatic cancer and acute pancreatitis.  Controlled pain  Remote history of VTE (pulmonary embolism): -Start prophylactic Lovenox-we will resume aspirin/Plavix. Encourage ambulation with PT.  Anxiety -Ativan PRN -12/31 Cymbalta 30 mg daily  History of tobacco abuse: -Smokes 2 packs/day stopped approximately 3 weeks ago when she became ill.    Hypokalemia -Potassium goal> 4 -K-Dur 50 mEq + potassium IV 30 mEq  Hypophosphatemia -WNL  Hypomagnesmia -Magnesium goal> 2     DVT prophylaxis: Lovenox Code Status: Full Family Communication: 01/03/2019 Spoke with Margaretmary Bayley (Daughter), and Juliann Pulse explained plan of care answered all questions [phone] Disposition Plan: Discharge 1/1   Consultants:    Procedures/Significant Events:  12/31 echocardiogram;Left Ventricle: EF= 35 to 40%. The left ventricle has severely decreased function. The left ventricle demonstrates global hypokinesis.  -Left ventricular diastolic parameters are consistent with Grade I diastolic dysfunction 1/1 CT abdomen pelvis W/WO contrast;-findings consistent with widespread metastatic disease, likely secondary to a central right lower lobe lung mass, presumably obscured by postobstructive consolidation.  -Findings include marked thoracic nodal adenopathy, bilateral liver metastasis, and equivocal but  suspicious osseous lesions. Possible upper abdominal nodal and left adrenal metastasis. Consider multidisciplinary thoracic oncology consultation for eventual sampling. - Concurrent right greater than left ground-glass opacities, likely related to COVID-19 pneumonia. -Small right pleural effusion. -Low-density lesion or lesions within the peripancreatic space and lesser sac, lymphangioma versus sequelae of prior pancreatitis. -CAD   I have personally reviewed and interpreted all radiology studies and my findings are as above.  VENTILATOR SETTINGS: Nasal cannula 1/1 Flow; 4 L/min SPO2; 94%   Cultures 12/28 HIV screen negative     Antimicrobials: Anti-infectives (From admission, onward)   Start     Dose/Rate Stop   12/31/18 1000  remdesivir 100 mg in sodium chloride 0.9 % 100 mL IVPB  Status:  Discontinued     100 mg 200 mL/hr over 30 Minutes 12/30/18 1432   12/31/18 1000  remdesivir 100 mg in sodium chloride 0.9 % 100 mL IVPB     100 mg 200 mL/hr over 30 Minutes 01/03/19 0959   12/30/18 1500  azithromycin (ZITHROMAX) 500 mg in sodium chloride 0.9 % 250 mL IVPB     500 mg 250 mL/hr over 60 Minutes 01/02/19 1459   12/30/18 1415  cefTRIAXone (ROCEPHIN) 1 g in sodium chloride 0.9 % 100 mL IVPB     1 g 200 mL/hr over 30 Minutes 01/04/19 1414   12/30/18 1400  cefTRIAXone (ROCEPHIN) injection 1 g  Status:  Discontinued     1 g 12/30/18 1400   12/30/18 1200  remdesivir 200 mg in sodium chloride 0.9% 250 mL IVPB     200 mg 580 mL/hr over 30 Minutes 12/30/18 1306       Devices  LINES / TUBES:     Continuous Infusions:  sodium chloride     dextrose 5 % and 0.45% NaCl 75 mL/hr at 01/03/19 1153   potassium chloride 10 mEq (01/03/19 1425)     Objective: Vitals:   01/03/19 0100 01/03/19 0500 01/03/19 0753 01/03/19 1201  BP:  115/69 136/78 124/80  Pulse:  74    Resp:      Temp:  98.2 F (36.8 C) 98.4 F (36.9 C) 98.1 F (36.7 C)  TempSrc:  Oral Oral Oral   SpO2: 92% 94%    Weight:      Height:        Intake/Output Summary (Last 24 hours) at 01/03/2019 1503 Last data filed at 01/02/2019 1800 Gross per 24 hour  Intake 280.54 ml  Output 1200 ml  Net -919.46 ml   Filed Weights   12/30/18 1953 01/01/19 1253 01/02/19 0500  Weight: 72.5 kg 72.3 kg 72.5 kg   Physical Exam:  General: A/O x4, positive acute respiratory distress Eyes: negative scleral hemorrhage, negative anisocoria, negative icterus ENT: Negative Runny nose, negative gingival bleeding, Neck:  Negative scars, masses, torticollis, lymphadenopathy, JVD Lungs: Clear to auscultation bilaterally without wheezes or crackles Cardiovascular: Regular rate and rhythm without murmur gallop or rub normal S1 and S2 Abdomen: negative abdominal pain, nondistended, positive soft, bowel sounds, no rebound, no ascites, no appreciable mass Extremities: No significant cyanosis, clubbing, or edema bilateral lower extremities Skin: Negative rashes, lesions, ulcers Psychiatric:  Negative depression, negative anxiety, negative fatigue, negative mania  Central nervous system:  Cranial nerves II through XII intact, tongue/uvula midline, all extremities muscle strength 5/5, sensation intact throughout, negative dysarthria, negative expressive aphasia, negative receptive aphasia.   .     Data Reviewed: Care during the described time interval was provided by me .  I have reviewed this patient's available data, including medical history, events of note, physical examination, and all test results as part of my evaluation.   CBC: Recent Labs  Lab 12/30/18 1110 12/31/18 0425 01/01/19 0253 01/02/19 0240 01/03/19 0809  WBC 9.0 15.2* 11.7* 11.4* 10.8*  NEUTROABS 8.1* 13.4* 10.1* 9.4* 8.8*  HGB 10.4* 11.5* 11.0* 10.8* 10.4*  HCT 35.0* 38.9 36.5 36.4 35.7*  MCV 86.2 84.4 84.3 84.1 85.8  PLT 248 367 269 283 818   Basic Metabolic Panel: Recent Labs  Lab 12/30/18 1110 12/31/18 0425 01/01/19 0253  01/02/19 0240 01/03/19 0809  NA 141 141 140 141 143  K 3.3* 3.3* 3.3* 3.1* 3.5  CL 101 101 99 97* 98  CO2 28 27 29  32 33*  GLUCOSE 155* 161* 147* 90 73  BUN 17 21* 21* 24* 30*  CREATININE 0.72 0.62 0.63 0.74 0.80  CALCIUM 7.9* 8.2* 7.7* 7.9* 8.0*  MG  --   --  1.6* 2.0 2.1  PHOS  --   --  2.0* 2.6 3.7   GFR: Estimated Creatinine Clearance: 70.5 mL/min (by C-G formula based on SCr of 0.8 mg/dL). Liver Function Tests: Recent Labs  Lab 12/30/18 1110 12/31/18 0425 01/01/19 0253 01/02/19 0240 01/03/19 0809  AST 36 38 41 49* 58*  ALT 28 30 30  34 33  ALKPHOS 160* 196* 175* 185* 215*  BILITOT 0.3 0.5 0.4 0.4 0.6  PROT 5.6* 6.2* 5.8* 5.5* 5.4*  ALBUMIN 1.8* 2.1* 1.9* 2.0* 1.8*   Recent Labs  Lab 01/03/19 0809  LIPASE 81*  AMYLASE 471*   No results for input(s): AMMONIA in the last 168 hours. Coagulation Profile: No results for input(s):  INR, PROTIME in the last 168 hours. Cardiac Enzymes: No results for input(s): CKTOTAL, CKMB, CKMBINDEX, TROPONINI in the last 168 hours. BNP (last 3 results) No results for input(s): PROBNP in the last 8760 hours. HbA1C: No results for input(s): HGBA1C in the last 72 hours. CBG: Recent Labs  Lab 01/02/19 1133 01/02/19 1535 01/02/19 2023 01/03/19 0755 01/03/19 1203  GLUCAP 116* 127* 144* 81 169*   Lipid Profile: No results for input(s): CHOL, HDL, LDLCALC, TRIG, CHOLHDL, LDLDIRECT in the last 72 hours. Thyroid Function Tests: No results for input(s): TSH, T4TOTAL, FREET4, T3FREE, THYROIDAB in the last 72 hours. Anemia Panel: Recent Labs    01/02/19 0240 01/03/19 0809  FERRITIN 76 93   Urine analysis: No results found for: COLORURINE, APPEARANCEUR, LABSPEC, PHURINE, GLUCOSEU, HGBUR, BILIRUBINUR, KETONESUR, PROTEINUR, UROBILINOGEN, NITRITE, LEUKOCYTESUR Sepsis Labs: @LABRCNTIP (procalcitonin:4,lacticidven:4)  )No results found for this or any previous visit (from the past 240 hour(s)).       Radiology Studies: CT  ABDOMEN PELVIS W WO CONTRAST  Result Date: 01/03/2019 CLINICAL DATA:  Epigastric abdominal pain. COVID-19 pneumonia. Coronary artery disease. Diabetes. Tobacco abuse. Drug seeking behavior. EXAM: CT ABDOMEN AND PELVIS WITHOUT AND WITH CONTRAST TECHNIQUE: Multidetector CT imaging of the abdomen and pelvis was performed following the standard protocol before and following the bolus administration of intravenous contrast. CONTRAST:  168mL OMNIPAQUE IOHEXOL 350 MG/ML SOLN COMPARISON:  Chest radiograph 12/30/2018. Renal ultrasound of 10/14/2018. Prior CT of 03/24/1999; report not available FINDINGS: Lower chest: Patchy areas of right greater than left ground-glass opacity. Right lower and less so right middle lobe consolidation. Marked thoracic adenopathy. 2.2 cm precarinal node on 02/04. A subcarinal node measures 2.2 cm on 09/04. Right hilar adenopathy at 1.7 cm on 10/04. Obstruction of the right lower and right middle lobe bronchi. Normal heart size. Trace right pleural fluid. Lad coronary artery atherosclerosis. Hepatobiliary: Hepatomegaly at 20.4 cm craniocaudal. Multiple hepatic masses, including a segment 4A 7.5 x 5.7 cm on 23/2. Right hepatic lobe 3.4 x 3.8 cm mass on 39/2. Normal gallbladder, without biliary ductal dilatation. Pancreas: Normal, without mass or ductal dilatation. Spleen: Normal in size, without focal abnormality. Adrenals/Urinary Tract: Normal right adrenal gland. 1.4 cm left adrenal nodule with indeterminate density characteristics. Lower pole right renal 2.4 cm cyst. Bilateral too small to characterize renal lesions. No hydronephrosis. Normal urinary bladder. Stomach/Bowel: Gastric antral underdistention. Minimal motion degradation in the upper abdomen. The cecum extends into the central pelvis. Normal terminal ileum. Appendix not visualized. Periampullary duodenal diverticulum.  Probable enterotomy. Vascular/Lymphatic: Aortic and branch vessel atherosclerosis. 9 mm left periaortic node on  36/2 is upper normal sized. An aortocaval node measures 9 mm on 35/2. Gastrohepatic ligament nodes of up to 1.6 cm on 29/2. Low-density bilobed lesion or lesions within the lesser sac and posterior to the pancreas, including at 1.8 x 2.3 cm on 28/2. No pelvic sidewall adenopathy. Reproductive: Normal uterus and adnexa. Other: No significant free fluid. Moderate to marked pelvic floor laxity. No evidence of omental or peritoneal disease. Musculoskeletal: Vague sclerotic lesions within the T10 vertebral body at 8 mm on 91 sagittal and T12 vertebral body measuring 7 mm on 86 sagittal. IMPRESSION: 1. Findings consistent with widespread metastatic disease, likely secondary to a central right lower lobe lung mass, presumably obscured by postobstructive consolidation. Findings include marked thoracic nodal adenopathy, bilateral liver metastasis, and equivocal but suspicious osseous lesions. Possible upper abdominal nodal and left adrenal metastasis. Consider multidisciplinary thoracic oncology consultation for eventual sampling. 2. Concurrent right greater than  left ground-glass opacities, likely related to COVID-19 pneumonia. 3. Small right pleural effusion. 4. Low-density lesion or lesions within the peripancreatic space and lesser sac, lymphangioma versus sequelae of prior pancreatitis. 5. Coronary artery atherosclerosis. Aortic Atherosclerosis (ICD10-I70.0). 6. Pelvic floor laxity. These results will be called to the ordering clinician or representative by the Radiologist Assistant, and communication documented in the PACS or zVision Dashboard. Electronically Signed   By: Abigail Miyamoto M.D.   On: 01/03/2019 10:31   ECHOCARDIOGRAM COMPLETE  Result Date: 01/02/2019   ECHOCARDIOGRAM REPORT   Patient Name:   Emily Thomas [facility] Date of Exam: 01/02/2019 Medical Rec #:  [id number]      Height:       62.0 in Accession #:    [id number]     Weight:       159.8 lb Date of Birth:  29-Jan-1960     BSA:          1.74 m Patient Age:     89 years       BP:           154/91 mmHg Patient Gender: F              HR:           65 bpm. Exam Location:  Inpatient Procedure: 2D Echo, Color Doppler and Cardiac Doppler Indications:    R07.9* Chest pain, unspecified  History:        Patient has no prior history of Echocardiogram examinations.                 CHF, CAD; Risk Factors:Hypertension and Diabetes. Verbal history                 of Echocardiogram in [facility].  Sonographer:    Raquel Sarna Senior RDCS Referring Phys: [id number] [place]  Sonographer Comments: Technically difficult study due to poor echo windows. Inpatient at [place]  1. Left ventricular ejection fraction, by visual estimation, is 35 to 40%. The left ventricle has severely decreased function. There is mildly increased left ventricular hypertrophy.  2. Left ventricular diastolic parameters are consistent with Grade I diastolic dysfunction (impaired relaxation).  3. The left ventricle demonstrates global hypokinesis.  4. Global right ventricle has low normal systolic function.The right ventricular size is normal. No increase in right ventricular wall thickness.  5. Left atrial size was normal.  6. Right atrial size was normal.  7. The mitral valve is abnormal. Trivial mitral valve regurgitation.  8. The tricuspid valve is not well visualized.  9. The aortic valve was not well visualized. Aortic valve regurgitation is mild. 10. The pulmonic valve was not well visualized. Pulmonic valve regurgitation is not visualized. 11. The inferior vena cava is normal in size with greater than 50% respiratory variability, suggesting right atrial pressure of 3 mmHg. 12. The interatrial septum was not well visualized. FINDINGS  Left Ventricle: Left ventricular ejection fraction, by visual estimation, is 35 to 40%. The left ventricle has severely decreased function. The left ventricle demonstrates global hypokinesis. There is mildly increased left ventricular hypertrophy. Left ventricular  diastolic parameters are consistent with Grade I diastolic dysfunction (impaired relaxation). Indeterminate filling pressures. Right Ventricle: The right ventricular size is normal. No increase in right ventricular wall thickness. Global RV systolic function is has low normal systolic function. Left Atrium: Left atrial size was normal in size. Right Atrium: Right atrial size was normal in size Pericardium: There is no evidence of pericardial effusion. Mitral  Valve: The mitral valve is abnormal. There is mild thickening of the mitral valve leaflet(s). Trivial mitral valve regurgitation. Tricuspid Valve: The tricuspid valve is not well visualized. Tricuspid valve regurgitation is trivial. Aortic Valve: The aortic valve was not well visualized. Aortic valve regurgitation is mild. Aortic regurgitation PHT measures 1207 msec. Pulmonic Valve: The pulmonic valve was not well visualized. Pulmonic valve regurgitation is not visualized. Pulmonic regurgitation is not visualized. Aorta: The aortic root and ascending aorta are structurally normal, with no evidence of dilitation. Venous: The inferior vena cava is normal in size with greater than 50% respiratory variability, suggesting right atrial pressure of 3 mmHg. IAS/Shunts: The interatrial septum was not well visualized.  LEFT VENTRICLE PLAX 2D LVIDd:         4.22 cm       Diastology LVIDs:         3.67 cm       LV e' lateral:   4.13 cm/s LV PW:         0.96 cm       LV E/e' lateral: 13.0 LV IVS:        1.06 cm       LV e' medial:    3.92 cm/s LVOT diam:     1.90 cm       LV E/e' medial:  13.7 LV SV:         22 ml LV SV Index:   12.45 LVOT Area:     2.84 cm  LV Volumes (MOD) LV area d, A2C:    36.60 cm LV area d, A4C:    40.90 cm LV area s, A2C:    27.00 cm LV area s, A4C:    30.90 cm LV major d, A2C:   8.77 cm LV major d, A4C:   9.01 cm LV major s, A2C:   8.11 cm LV major s, A4C:   8.24 cm LV vol d, MOD A2C: 127.0 ml LV vol d, MOD A4C: 153.0 ml LV vol s, MOD A2C: 76.9  ml LV vol s, MOD A4C: 98.9 ml LV SV MOD A2C:     50.1 ml LV SV MOD A4C:     153.0 ml LV SV MOD BP:      53.5 ml RIGHT VENTRICLE RV S prime:     13.20 cm/s TAPSE (M-mode): 2.6 cm LEFT ATRIUM             Index       RIGHT ATRIUM           Index LA diam:        3.30 cm 1.90 cm/m  RA Area:     10.10 cm LA Vol (A2C):   48.1 ml 27.69 ml/m RA Volume:   19.80 ml  11.40 ml/m LA Vol (A4C):   57.2 ml 32.93 ml/m LA Biplane Vol: 53.5 ml 30.80 ml/m  AORTIC VALVE LVOT Vmax:   119.00 cm/s LVOT Vmean:  79.200 cm/s LVOT VTI:    0.200 m AI PHT:      1207 msec  AORTA Ao Root diam: 3.10 cm Ao Asc diam:  3.20 cm MITRAL VALVE MV Area (PHT): 3.65 cm             SHUNTS MV PHT:        60.32 msec           Systemic VTI:  0.20 m MV Decel Time: 208 msec  Systemic Diam: 1.90 cm MV E velocity: 53.80 cm/s 103 cm/s MV A velocity: 64.00 cm/s 70.3 cm/s MV E/A ratio:  0.84       1.5  Lyman Bishop MD Electronically signed by Lyman Bishop MD Signature Date/Time: 01/02/2019/11:02:11 AM    Final         Scheduled Meds:  vitamin C  500 mg Oral Daily   aspirin  81 mg Oral Daily   clopidogrel  75 mg Oral Daily   dexamethasone (DECADRON) injection  6 mg Intravenous Q24H   DULoxetine  30 mg Oral Daily   enoxaparin (LOVENOX) injection  40 mg Subcutaneous Q24H   famotidine  20 mg Oral Daily   fentaNYL  1 patch Transdermal Q72H   furosemide  40 mg Intravenous BID   insulin aspart  0-9 Units Subcutaneous TID WC   insulin glargine  5 Units Subcutaneous Daily   Ipratropium-Albuterol  1 puff Inhalation Q6H   lisinopril  10 mg Oral Daily   metoprolol succinate  50 mg Oral Daily   pantoprazole  40 mg Oral BID   polyethylene glycol  17 g Oral Daily   senna-docusate  2 tablet Oral QHS   sodium chloride flush  3 mL Intravenous Q12H   sucralfate  1 g Oral TID AC & HS   zinc sulfate  220 mg Oral Daily   Continuous Infusions:  sodium chloride     dextrose 5 % and 0.45% NaCl 75 mL/hr at 01/03/19 1153    potassium chloride 10 mEq (01/03/19 1425)     LOS: 4 days   The patient is critically ill with multiple organ systems failure and requires high complexity decision making for assessment and support, frequent evaluation and titration of therapies, application of advanced monitoring technologies and extensive interpretation of multiple databases. Critical Care Time devoted to patient care services described in this note  Time spent: 40 minutes     Lamekia Nolden, Geraldo Docker, MD [facility] Pager [phone]  If 7PM-7AM, please contact night-coverage [email or web address] Password [facility] 01/03/2019, 3:03 PM

## 2019-01-03 NOTE — Progress Notes (Signed)
Spoke with the patient's daughter, Margaretmary Bayley, and answered all questions about the patient at this time.

## 2019-01-04 ENCOUNTER — Inpatient Hospital Stay (HOSPITAL_COMMUNITY): Payer: Medicaid Other

## 2019-01-04 LAB — COMPREHENSIVE METABOLIC PANEL
ALT: 30 U/L (ref 0–44)
AST: 56 U/L — ABNORMAL HIGH (ref 15–41)
Albumin: 1.9 g/dL — ABNORMAL LOW (ref 3.5–5.0)
Alkaline Phosphatase: 210 U/L — ABNORMAL HIGH (ref 38–126)
Anion gap: 11 (ref 5–15)
BUN: 23 mg/dL — ABNORMAL HIGH (ref 6–20)
CO2: 32 mmol/L (ref 22–32)
Calcium: 7.8 mg/dL — ABNORMAL LOW (ref 8.9–10.3)
Chloride: 97 mmol/L — ABNORMAL LOW (ref 98–111)
Creatinine, Ser: 0.68 mg/dL (ref 0.44–1.00)
GFR calc Af Amer: >60 mL/min (ref >60)
GFR calc non Af Amer: >60 mL/min (ref >60)
Glucose, Bld: 102 mg/dL — ABNORMAL HIGH (ref 70–99)
Potassium: 3.1 mmol/L — ABNORMAL LOW (ref 3.5–5.1)
Sodium: 140 mmol/L (ref 135–145)
Total Bilirubin: 0.4 mg/dL (ref 0.3–1.2)
Total Protein: 5.4 g/dL — ABNORMAL LOW (ref 6.5–8.1)

## 2019-01-04 LAB — CBC WITH DIFFERENTIAL/PLATELET
Abs Immature Granulocytes: 0.53 10*3/uL — ABNORMAL HIGH (ref 0.00–0.07)
Basophils Absolute: 0.1 10*3/uL (ref 0.0–0.1)
Basophils Relative: 1 %
Eosinophils Absolute: 0 10*3/uL (ref 0.0–0.5)
Eosinophils Relative: 0 %
HCT: 34.6 % — ABNORMAL LOW (ref 36.0–46.0)
Hemoglobin: 10.2 g/dL — ABNORMAL LOW (ref 12.0–15.0)
Immature Granulocytes: 6 %
Lymphocytes Relative: 9 %
Lymphs Abs: 0.7 10*3/uL (ref 0.7–4.0)
MCH: 25.2 pg — ABNORMAL LOW (ref 26.0–34.0)
MCHC: 29.5 g/dL — ABNORMAL LOW (ref 30.0–36.0)
MCV: 85.4 fL (ref 80.0–100.0)
Monocytes Absolute: 0.3 10*3/uL (ref 0.1–1.0)
Monocytes Relative: 4 %
Neutro Abs: 7 10*3/uL (ref 1.7–7.7)
Neutrophils Relative %: 80 %
Platelets: 236 10*3/uL (ref 150–400)
RBC: 4.05 MIL/uL (ref 3.87–5.11)
RDW: 15.7 % — ABNORMAL HIGH (ref 11.5–15.5)
WBC: 8.7 10*3/uL (ref 4.0–10.5)
nRBC: 0 % (ref 0.0–0.2)

## 2019-01-04 LAB — FERRITIN: Ferritin: 97 ng/mL (ref 11–307)

## 2019-01-04 LAB — PHOSPHORUS: Phosphorus: 2.7 mg/dL (ref 2.5–4.6)

## 2019-01-04 LAB — C-REACTIVE PROTEIN: CRP: 2 mg/dL — ABNORMAL HIGH (ref <1.0)

## 2019-01-04 LAB — GLUCOSE, CAPILLARY
Glucose-Capillary: 95 mg/dL (ref 70–99)
Glucose-Capillary: 124 mg/dL — ABNORMAL HIGH (ref 70–99)
Glucose-Capillary: 137 mg/dL — ABNORMAL HIGH (ref 70–99)
Glucose-Capillary: 136 mg/dL — ABNORMAL HIGH (ref 70–99)

## 2019-01-04 LAB — MAGNESIUM: Magnesium: 1.9 mg/dL (ref 1.7–2.4)

## 2019-01-04 LAB — D-DIMER, QUANTITATIVE: D-Dimer, Quant: 1.7 ug/mL-FEU — ABNORMAL HIGH (ref 0.00–0.50)

## 2019-01-04 MED ORDER — POTASSIUM CHLORIDE 10 MEQ/100ML IV SOLN
10.0000 meq | INTRAVENOUS | Status: AC
  Administered 2019-01-04 (×5): 10 meq via INTRAVENOUS
  Filled 2019-01-04 (×4): qty 100

## 2019-01-04 MED ORDER — MAGNESIUM SULFATE IN D5W 1-5 GM/100ML-% IV SOLN
1.0000 g | Freq: Once | INTRAVENOUS | Status: AC
  Administered 2019-01-04: 1 g via INTRAVENOUS
  Filled 2019-01-04: qty 100

## 2019-01-04 NOTE — Progress Notes (Signed)
PROGRESS NOTE    Emily TOWNSEL  TFT:[id number] DOB: Aug 22, 1960 DOA: 12/30/2018 PCP: Patient, No Pcp Per   Brief Narrative:  Emily Thomas is a 59 y.o. WF PMHx tobacco abuse, chronic back pain due to degenerative disc disease, diabetes type 2 uncontrolled with complication, HTN,CAD s/p PCI approximately 1 year back-chronic, Hx remote pulmonary embolism no longer on anticoagulation, epigastric pain-being worked up in the outpatient setting (EGD/colonoscopy scheduled), Hx gastric ulcer, Hx hiatal hernia  Presenting as a transfer from [place] for evaluation of acute hypoxic respiratory failure in the setting of COVID-19 pneumonia.  Per patient, for the past 1 month or so-she has had worsening chronic back pain-and epigastric pain.  She apparently recently saw her primary care/GI MD-and was prepped for colonoscopy/endoscopy-however due to poor bowel preparation-endoscopy evaluation could not be completed.  She claims that since December 15-after she completed a bowel prep for colonoscopy-she started feeling fatigue with generalized myalgias.  She then started developing shortness of breath that has gradually progressed over the past few days.  She presented to [facility] she had severe exertional dyspnea and could hardly walk a few feet.  At [facility] emergency room-patient was found to have pneumonia-her COVID-19 swab was positive-and she was requiring around 5 L to maintain O2 saturations above 90.  Subsequently case was discussed with the prior hospitalist at [facility] patient was accepted as a transfer  Patient denies any fever, nausea, vomiting or diarrhea.   Subjective: 1/2 afebrile last 24 hours A/O x4, still complains of pain despite new pain regimen, But appears much more comfortable      Assessment & Plan:   Principal Problem:   Acute respiratory failure with hypoxemia (HCC) Active Problems:   Pneumonia due to COVID-19 virus  HTN (hypertension)   Chronic systolic CHF (congestive heart failure) (HCC)   CAD (coronary artery disease)   Essential hypertension   Diabetes mellitus type 2, uncontrolled, with complications (HCC)   Chronic epigastric pain   Tobacco abuse   Drug-seeking behavior   Acute pancreatitis   Metastatic cancer ([place])  Covid pneumonia/acute respiratory failure with hypoxia COVID-19 Labs  Recent Labs    01/02/19 0240 01/03/19 0809  DDIMER 2.32* 1.85*  FERRITIN 76 93  CRP 1.7* 3.8*    No results found for: SARSCOV2NAA -Decadron 6 mg daily -Remdesivir per pharmacy protocol (last dose 1/1) -Combivent -Titrate O2 to maintain SPO2> 88% -Flutter valve -Incentive spirometer -Prone patient 16 hours/day; if patient cannot stand that length of time prone to 3 hours every shift  Chronic systolic and diastolic CHF -Unknown based weight-Strict ins and outs -7.1 L -Daily weight Filed Weights   01/01/19 1253 01/02/19 0500 01/04/19 0500  Weight: 72.3 kg 72.5 kg 73.3 kg  -12/30 echocardiogram; shows systolic and diastolic CHF see results below -Lasix IV 40 mg BID -Lisinopril 10 mg daily -Toprol 50 mg daily  Hx CAD s/p PCI -ASA 81 mg daily -Plavix. 75 mg daily  HTN -See CHF  Diabetes type 2 uncontrolled with complication* -70/62 hemoglobin A1c= 7.2 -Sensitive SSI -12/30 Lantus 5 units daily  Epigastric pain (chronic) -Hx gastric ulcer and hiatal hernia.:  -Abdomen currently soft, nontender palpation -12/31 DC Maalox--> GI cocktail PRN -Carafate 1 gTID -1/1see metastatic lung cancer and acute pancreatitis  Chronic back pain -Has been on methadone and oxycodone in the past. -Due to acute pancreatitis DC all  PO pain medication.   -1/1 fentanyl patch 75 mcg/h -Morphine 2 mg q4 PRN -Toradol 15  mg PRN  Metastatic right lower lobe lung cancer -Patient has large mass in liver, lung, kidney i.e. multiple places.  See CT -1/1 discussed case with Dr. Lamonte Sakai, and after completion  of Covid treatment believes may be easier to obtain liver biopsy. -Consult IR for liver biopsy to confirm primary source of metastatic cancer. -1/1 Palliative Care consult placed -1/2 consult to IR placed for liver biopsy; diagnosis/staging of newly discovered metastatic cancer -1/2 head CT pending staging purposes -1/2 chest CT pending staging purposes -PTH-RP pending -ADH pending -ACTH pending  Acute pancreatitis -Elevated lipase and amylase -N.p.o. except meds -D5-0.45% saline 81ml/hr -Trend lipase  Benzodiazepine abuse? -Staff has observed that patient arrived with a new bottle of lorazepam and 20 tablets have been used within 24 hours. -Monitor closely for withdrawal  Drug-seeking behavior -Be very judicious in use of benzodiazepine and narcotics -1/1 patient with metastatic cancer and acute pancreatitis.  Control pain  Remote history of VTE (pulmonary embolism): -Start prophylactic Lovenox-we will resume aspirin/Plavix. Encourage ambulation with PT.  Anxiety -Ativan PRN -12/31 Cymbalta 30 mg daily  History of tobacco abuse: -Smokes 2 packs/day stopped approximately 3 weeks ago when she became ill.    Hypokalemia -Potassium goal> 4 -Potassium IV 60 mEq  Hypophosphatemia -WNL  Hypomagnesmia -Magnesium goal> 2 -Magnesium IV 1 g    DVT prophylaxis: Lovenox Code Status: Full Family Communication: 01/03/2019 Spoke with Margaretmary Bayley (Daughter), and Juliann Pulse explained plan of care answered all questions [phone] Disposition Plan: Discharge 1/1   Consultants:    Procedures/Significant Events:  12/31 echocardiogram;Left Ventricle: EF= 35 to 40%. The left ventricle has severely decreased function. The left ventricle demonstrates global hypokinesis.  -Left ventricular diastolic parameters are consistent with Grade I diastolic dysfunction 1/1 CT abdomen pelvis W/WO contrast;-findings consistent with widespread metastatic disease, likely secondary to a central  right lower lobe lung mass, presumably obscured by postobstructive consolidation.  -Findings include marked thoracic nodal adenopathy, bilateral liver metastasis, and equivocal but suspicious osseous lesions. Possible upper abdominal nodal and left adrenal metastasis. Consider multidisciplinary thoracic oncology consultation for eventual sampling. - Concurrent right greater than left ground-glass opacities, likely related to COVID-19 pneumonia. -Small right pleural effusion. -Low-density lesion or lesions within the peripancreatic space and lesser sac, lymphangioma versus sequelae of prior pancreatitis. -CAD   I have personally reviewed and interpreted all radiology studies and my findings are as above.  VENTILATOR SETTINGS: Nasal cannula 1/2 Flow; 3 L/min SPO2; 94%   Cultures 12/28 HIV screen negative     Antimicrobials: Anti-infectives (From admission, onward)   Start     Dose/Rate Stop   12/31/18 1000  remdesivir 100 mg in sodium chloride 0.9 % 100 mL IVPB  Status:  Discontinued     100 mg 200 mL/hr over 30 Minutes 12/30/18 1432   12/31/18 1000  remdesivir 100 mg in sodium chloride 0.9 % 100 mL IVPB     100 mg 200 mL/hr over 30 Minutes 01/03/19 0959   12/30/18 1500  azithromycin (ZITHROMAX) 500 mg in sodium chloride 0.9 % 250 mL IVPB     500 mg 250 mL/hr over 60 Minutes 01/02/19 1459   12/30/18 1415  cefTRIAXone (ROCEPHIN) 1 g in sodium chloride 0.9 % 100 mL IVPB     1 g 200 mL/hr over 30 Minutes 01/04/19 1414   12/30/18 1400  cefTRIAXone (ROCEPHIN) injection 1 g  Status:  Discontinued     1 g 12/30/18 1400   12/30/18 1200  remdesivir 200 mg in sodium chloride 0.9% 250  mL IVPB     200 mg 580 mL/hr over 30 Minutes 12/30/18 1306       Devices    LINES / TUBES:     Continuous Infusions: . sodium chloride    . dextrose 5 % and 0.45% NaCl 75 mL/hr at 01/04/19 0216     Objective: Vitals:   01/03/19 2100 01/04/19 0500 01/04/19 0738 01/04/19 0859  BP: 121/73   (!) 141/81   Pulse: 70  81   Resp:   13   Temp: 98.1 F (36.7 C)  (!) 97.4 F (36.3 C) 97.7 F (36.5 C)  TempSrc: Oral  Oral Oral  SpO2: 94%  94%   Weight:  73.3 kg    Height:        Intake/Output Summary (Last 24 hours) at 01/04/2019 5465 Last data filed at 01/03/2019 1529 Gross per 24 hour  Intake 620 ml  Output 1800 ml  Net -1180 ml   Filed Weights   01/01/19 1253 01/02/19 0500 01/04/19 0500  Weight: 72.3 kg 72.5 kg 73.3 kg   Physical Exam:  General: A/O x4, positive acute respiratory distress Eyes: negative scleral hemorrhage, negative anisocoria, negative icterus ENT: Negative Runny nose, negative gingival bleeding, Neck:  Negative scars, masses, torticollis, lymphadenopathy, JVD Lungs: Clear to auscultation bilaterally without wheezes or crackles Cardiovascular: Regular rate and rhythm without murmur gallop or rub normal S1 and S2 Abdomen: negative abdominal pain, mildly distended, positive soft, bowel sounds, no rebound, no ascites, no appreciable mass Extremities: No significant cyanosis, clubbing, or edema bilateral lower extremities Skin: Negative rashes, lesions, ulcers Psychiatric: Positive depression, positive anxiety, negative fatigue, negative mania  Central nervous system:  Cranial nerves II through XII intact, tongue/uvula midline, all extremities muscle strength 5/5, sensation intact throughout, negative dysarthria, negative expressive aphasia, negative receptive aphasia.   .     Data Reviewed: Care during the described time interval was provided by me .  I have reviewed this patient's available data, including medical history, events of note, physical examination, and all test results as part of my evaluation.   CBC: Recent Labs  Lab 12/30/18 1110 12/31/18 0425 01/01/19 0253 01/02/19 0240 01/03/19 0809  WBC 9.0 15.2* 11.7* 11.4* 10.8*  NEUTROABS 8.1* 13.4* 10.1* 9.4* 8.8*  HGB 10.4* 11.5* 11.0* 10.8* 10.4*  HCT 35.0* 38.9 36.5 36.4 35.7*  MCV  86.2 84.4 84.3 84.1 85.8  PLT 248 367 269 283 035   Basic Metabolic Panel: Recent Labs  Lab 12/30/18 1110 12/31/18 0425 01/01/19 0253 01/02/19 0240 01/03/19 0809  NA 141 141 140 141 143  K 3.3* 3.3* 3.3* 3.1* 3.5  CL 101 101 99 97* 98  CO2 28 27 29  32 33*  GLUCOSE 155* 161* 147* 90 73  BUN 17 21* 21* 24* 30*  CREATININE 0.72 0.62 0.63 0.74 0.80  CALCIUM 7.9* 8.2* 7.7* 7.9* 8.0*  MG  --   --  1.6* 2.0 2.1  PHOS  --   --  2.0* 2.6 3.7   GFR: Estimated Creatinine Clearance: 70.9 mL/min (by C-G formula based on SCr of 0.8 mg/dL). Liver Function Tests: Recent Labs  Lab 12/30/18 1110 12/31/18 0425 01/01/19 0253 01/02/19 0240 01/03/19 0809  AST 36 38 41 49* 58*  ALT 28 30 30  34 33  ALKPHOS 160* 196* 175* 185* 215*  BILITOT 0.3 0.5 0.4 0.4 0.6  PROT 5.6* 6.2* 5.8* 5.5* 5.4*  ALBUMIN 1.8* 2.1* 1.9* 2.0* 1.8*   Recent Labs  Lab 01/03/19 0809  LIPASE 81*  AMYLASE 471*   No results for input(s): AMMONIA in the last 168 hours. Coagulation Profile: No results for input(s): INR, PROTIME in the last 168 hours. Cardiac Enzymes: No results for input(s): CKTOTAL, CKMB, CKMBINDEX, TROPONINI in the last 168 hours. BNP (last 3 results) No results for input(s): PROBNP in the last 8760 hours. HbA1C: No results for input(s): HGBA1C in the last 72 hours. CBG: Recent Labs  Lab 01/03/19 0755 01/03/19 1203 01/03/19 1624 01/03/19 2213 01/04/19 0829  GLUCAP 81 169* 90 115* 95   Lipid Profile: No results for input(s): CHOL, HDL, LDLCALC, TRIG, CHOLHDL, LDLDIRECT in the last 72 hours. Thyroid Function Tests: No results for input(s): TSH, T4TOTAL, FREET4, T3FREE, THYROIDAB in the last 72 hours. Anemia Panel: Recent Labs    01/02/19 0240 01/03/19 0809  FERRITIN 76 93   Urine analysis: No results found for: COLORURINE, APPEARANCEUR, LABSPEC, PHURINE, GLUCOSEU, HGBUR, BILIRUBINUR, KETONESUR, PROTEINUR, UROBILINOGEN, NITRITE, LEUKOCYTESUR Sepsis  Labs: @LABRCNTIP (procalcitonin:4,lacticidven:4)  )No results found for this or any previous visit (from the past 240 hour(s)).       Radiology Studies: CT ABDOMEN PELVIS W WO CONTRAST  Result Date: 01/03/2019 CLINICAL DATA:  Epigastric abdominal pain. COVID-19 pneumonia. Coronary artery disease. Diabetes. Tobacco abuse. Drug seeking behavior. EXAM: CT ABDOMEN AND PELVIS WITHOUT AND WITH CONTRAST TECHNIQUE: Multidetector CT imaging of the abdomen and pelvis was performed following the standard protocol before and following the bolus administration of intravenous contrast. CONTRAST:  138mL OMNIPAQUE IOHEXOL 350 MG/ML SOLN COMPARISON:  Chest radiograph 12/30/2018. Renal ultrasound of 10/14/2018. Prior CT of 03/24/1999; report not available FINDINGS: Lower chest: Patchy areas of right greater than left ground-glass opacity. Right lower and less so right middle lobe consolidation. Marked thoracic adenopathy. 2.2 cm precarinal node on 02/04. A subcarinal node measures 2.2 cm on 09/04. Right hilar adenopathy at 1.7 cm on 10/04. Obstruction of the right lower and right middle lobe bronchi. Normal heart size. Trace right pleural fluid. Lad coronary artery atherosclerosis. Hepatobiliary: Hepatomegaly at 20.4 cm craniocaudal. Multiple hepatic masses, including a segment 4A 7.5 x 5.7 cm on 23/2. Right hepatic lobe 3.4 x 3.8 cm mass on 39/2. Normal gallbladder, without biliary ductal dilatation. Pancreas: Normal, without mass or ductal dilatation. Spleen: Normal in size, without focal abnormality. Adrenals/Urinary Tract: Normal right adrenal gland. 1.4 cm left adrenal nodule with indeterminate density characteristics. Lower pole right renal 2.4 cm cyst. Bilateral too small to characterize renal lesions. No hydronephrosis. Normal urinary bladder. Stomach/Bowel: Gastric antral underdistention. Minimal motion degradation in the upper abdomen. The cecum extends into the central pelvis. Normal terminal ileum. Appendix not  visualized. Periampullary duodenal diverticulum.  Probable enterotomy. Vascular/Lymphatic: Aortic and branch vessel atherosclerosis. 9 mm left periaortic node on 36/2 is upper normal sized. An aortocaval node measures 9 mm on 35/2. Gastrohepatic ligament nodes of up to 1.6 cm on 29/2. Low-density bilobed lesion or lesions within the lesser sac and posterior to the pancreas, including at 1.8 x 2.3 cm on 28/2. No pelvic sidewall adenopathy. Reproductive: Normal uterus and adnexa. Other: No significant free fluid. Moderate to marked pelvic floor laxity. No evidence of omental or peritoneal disease. Musculoskeletal: Vague sclerotic lesions within the T10 vertebral body at 8 mm on 91 sagittal and T12 vertebral body measuring 7 mm on 86 sagittal. IMPRESSION: 1. Findings consistent with widespread metastatic disease, likely secondary to a central right lower lobe lung mass, presumably obscured by postobstructive consolidation. Findings include marked thoracic nodal adenopathy, bilateral liver metastasis, and equivocal but suspicious osseous lesions. Possible  upper abdominal nodal and left adrenal metastasis. Consider multidisciplinary thoracic oncology consultation for eventual sampling. 2. Concurrent right greater than left ground-glass opacities, likely related to COVID-19 pneumonia. 3. Small right pleural effusion. 4. Low-density lesion or lesions within the peripancreatic space and lesser sac, lymphangioma versus sequelae of prior pancreatitis. 5. Coronary artery atherosclerosis. Aortic Atherosclerosis (ICD10-I70.0). 6. Pelvic floor laxity. These results will be called to the ordering clinician or representative by the Radiologist Assistant, and communication documented in the PACS or zVision Dashboard. Electronically Signed   By: Abigail Miyamoto M.D.   On: 01/03/2019 10:31        Scheduled Meds: . vitamin C  500 mg Oral Daily  . aspirin  81 mg Oral Daily  . clopidogrel  75 mg Oral Daily  . dexamethasone  (DECADRON) injection  6 mg Intravenous Q24H  . DULoxetine  30 mg Oral Daily  . enoxaparin (LOVENOX) injection  40 mg Subcutaneous Q24H  . famotidine  20 mg Oral Daily  . fentaNYL  1 patch Transdermal Q72H  . furosemide  40 mg Intravenous BID  . insulin aspart  0-9 Units Subcutaneous TID WC  . insulin glargine  5 Units Subcutaneous Daily  . Ipratropium-Albuterol  1 puff Inhalation Q6H  . lisinopril  10 mg Oral Daily  . metoprolol succinate  50 mg Oral Daily  . pantoprazole  40 mg Oral BID  . polyethylene glycol  17 g Oral Daily  . senna-docusate  2 tablet Oral QHS  . sodium chloride flush  3 mL Intravenous Q12H  . sucralfate  1 g Oral TID AC & HS  . zinc sulfate  220 mg Oral Daily   Continuous Infusions: . sodium chloride    . dextrose 5 % and 0.45% NaCl 75 mL/hr at 01/04/19 0216     LOS: 5 days   The patient is critically ill with multiple organ systems failure and requires high complexity decision making for assessment and support, frequent evaluation and titration of therapies, application of advanced monitoring technologies and extensive interpretation of multiple databases. Critical Care Time devoted to patient care services described in this note  Time spent: 40 minutes     Wandalee Klang, Geraldo Docker, MD [facility] Pager [phone]  If 7PM-7AM, please contact night-coverage [email or web address] Password [facility] 01/04/2019, 9:25 AM

## 2019-01-04 NOTE — Progress Notes (Signed)
Pt complaint of abdominal and back pain, tordal given as per orders, will monitor effectiveness

## 2019-01-04 NOTE — Progress Notes (Signed)
Pt A&Ox4, cont to c/o abdomen and back pain, medicated as ordered, some relief noted, K+ low IVPB K+ as ordered, Mag low IVPB Mag given, OOB to [facility] with SB assist. Head and Chest CT performed, remains NPO for pending IR procedure. D51/2NS infusing as ordered, no s/s of hypo/hyperglycemia noted, s/s insulin prn. Will cont to monitor.

## 2019-01-05 DIAGNOSIS — Z765 Malingerer [conscious simulation]: Secondary | ICD-10-CM

## 2019-01-05 DIAGNOSIS — C787 Secondary malignant neoplasm of liver and intrahepatic bile duct: Secondary | ICD-10-CM

## 2019-01-05 DIAGNOSIS — J9601 Acute respiratory failure with hypoxia: Secondary | ICD-10-CM

## 2019-01-05 DIAGNOSIS — K858 Other acute pancreatitis without necrosis or infection: Secondary | ICD-10-CM

## 2019-01-05 DIAGNOSIS — E876 Hypokalemia: Secondary | ICD-10-CM | POA: Diagnosis present

## 2019-01-05 DIAGNOSIS — E663 Overweight: Secondary | ICD-10-CM

## 2019-01-05 LAB — CBC WITH DIFFERENTIAL/PLATELET
Abs Immature Granulocytes: 0.55 10*3/uL — ABNORMAL HIGH (ref 0.00–0.07)
Basophils Absolute: 0.1 10*3/uL (ref 0.0–0.1)
Basophils Relative: 1 %
Eosinophils Absolute: 0 10*3/uL (ref 0.0–0.5)
Eosinophils Relative: 0 %
HCT: 35.7 % — ABNORMAL LOW (ref 36.0–46.0)
Hemoglobin: 10.8 g/dL — ABNORMAL LOW (ref 12.0–15.0)
Immature Granulocytes: 6 %
Lymphocytes Relative: 7 %
Lymphs Abs: 0.7 10*3/uL (ref 0.7–4.0)
MCH: 25.5 pg — ABNORMAL LOW (ref 26.0–34.0)
MCHC: 30.3 g/dL (ref 30.0–36.0)
MCV: 84.4 fL (ref 80.0–100.0)
Monocytes Absolute: 0.4 10*3/uL (ref 0.1–1.0)
Monocytes Relative: 4 %
Neutro Abs: 8 10*3/uL — ABNORMAL HIGH (ref 1.7–7.7)
Neutrophils Relative %: 82 %
Platelets: 245 10*3/uL (ref 150–400)
RBC: 4.23 MIL/uL (ref 3.87–5.11)
RDW: 15.9 % — ABNORMAL HIGH (ref 11.5–15.5)
WBC: 9.6 10*3/uL (ref 4.0–10.5)
nRBC: 0.2 % (ref 0.0–0.2)

## 2019-01-05 LAB — COMPREHENSIVE METABOLIC PANEL
ALT: 30 U/L (ref 0–44)
AST: 59 U/L — ABNORMAL HIGH (ref 15–41)
Albumin: 1.9 g/dL — ABNORMAL LOW (ref 3.5–5.0)
Alkaline Phosphatase: 222 U/L — ABNORMAL HIGH (ref 38–126)
Anion gap: 12 (ref 5–15)
BUN: 21 mg/dL — ABNORMAL HIGH (ref 6–20)
CO2: 33 mmol/L — ABNORMAL HIGH (ref 22–32)
Calcium: 7.7 mg/dL — ABNORMAL LOW (ref 8.9–10.3)
Chloride: 93 mmol/L — ABNORMAL LOW (ref 98–111)
Creatinine, Ser: 0.76 mg/dL (ref 0.44–1.00)
GFR calc Af Amer: >60 mL/min (ref >60)
GFR calc non Af Amer: >60 mL/min (ref >60)
Glucose, Bld: 136 mg/dL — ABNORMAL HIGH (ref 70–99)
Potassium: 2.7 mmol/L — CL (ref 3.5–5.1)
Sodium: 138 mmol/L (ref 135–145)
Total Bilirubin: 0.5 mg/dL (ref 0.3–1.2)
Total Protein: 5.4 g/dL — ABNORMAL LOW (ref 6.5–8.1)

## 2019-01-05 LAB — GLUCOSE, CAPILLARY
Glucose-Capillary: 138 mg/dL — ABNORMAL HIGH (ref 70–99)
Glucose-Capillary: 134 mg/dL — ABNORMAL HIGH (ref 70–99)
Glucose-Capillary: 125 mg/dL — ABNORMAL HIGH (ref 70–99)
Glucose-Capillary: 235 mg/dL — ABNORMAL HIGH (ref 70–99)

## 2019-01-05 LAB — FERRITIN: Ferritin: 110 ng/mL (ref 11–307)

## 2019-01-05 LAB — MAGNESIUM: Magnesium: 2.2 mg/dL (ref 1.7–2.4)

## 2019-01-05 LAB — D-DIMER, QUANTITATIVE: D-Dimer, Quant: 1.73 ug/mL-FEU — ABNORMAL HIGH (ref 0.00–0.50)

## 2019-01-05 LAB — POTASSIUM: Potassium: 2.8 mmol/L — ABNORMAL LOW (ref 3.5–5.1)

## 2019-01-05 LAB — LIPASE, BLOOD: Lipase: 74 U/L — ABNORMAL HIGH (ref 11–51)

## 2019-01-05 LAB — PHOSPHORUS: Phosphorus: 3.5 mg/dL (ref 2.5–4.6)

## 2019-01-05 LAB — C-REACTIVE PROTEIN: CRP: 1.2 mg/dL — ABNORMAL HIGH (ref <1.0)

## 2019-01-05 MED ORDER — POTASSIUM CHLORIDE CRYS ER 20 MEQ PO TBCR
40.0000 meq | EXTENDED_RELEASE_TABLET | ORAL | Status: AC
  Administered 2019-01-05 (×2): 40 meq via ORAL
  Filled 2019-01-05 (×2): qty 2

## 2019-01-05 MED ORDER — POTASSIUM CHLORIDE 10 MEQ/100ML IV SOLN
10.0000 meq | INTRAVENOUS | Status: AC
  Administered 2019-01-05 (×4): 10 meq via INTRAVENOUS
  Filled 2019-01-05 (×3): qty 100

## 2019-01-05 NOTE — Progress Notes (Signed)
PROGRESS NOTE  Emily Thomas EXN:[id number] DOB: 03/28/1960 DOA: 12/30/2018 PCP: Patient, No Pcp Per  HPI/Recap of past 91 hours: 59 year old female with past medical history of chronic back pain, remote PE no longer on anticoagulation, tobacco abuse and hypertension who was transferred from [place] for Covid pneumonia with secondary hypoxia.  Patient states that she has been fatigued with shortness of breath for at least a week since admission.  Patient has been on Decadron and remdesivir.  Patient's course also complicated by issue of acute pancreatitis.  Following admission, patient underwent an echocardiogram on 49/67 noting systolic/diastolic heart failure.  She has been treated with IV Lasix.  On 1/1, patient underwent an abdominal CT which noted evidence of a liver mass and evidence of a lung mass.  This was confirmed by chest CT highly suspicious for a right lower lobe metastatic lung cancer.  CT scan of the head suspicious for metastases in the left occipital lobe.    Patient overall appears to be doing a little better.  Plan is for IR to do liver biopsy.  Patient able to be weaned off of oxygen at times altogether or at least a minimal amount.  She is currently resting comfortably with no complaints.  Assessment/Plan: Principal Problem:   Acute respiratory failure with hypoxemia (HCC) secondary to COVID-19 and acute diastolic/systolic CHF: Status post remdesivir, continue steroids.  Oxygenation much improving, down from 5 L.  Continue Lasix.  Echocardiogram done 12/31 note ejection fraction 35-40% with global hypokinesis and grade 1 diastolic dysfunction. Active Problems:   Essential hypertension: Blood pressure stable.    Diabetes mellitus type 2, uncontrolled, with complications ([place]): CBG stable, right now she is n.p.o.    Chronic epigastric pain: Likely in part from her pancreatitis.    Tobacco abuse: Counseled.  Nicotine patch.    Drug-seeking behavior/chronic back  pain: Has been on methadone oxycodone in the past.  Currently p.o. pain medications on hold.  On as needed Toradol and IV morphine plus fentanyl patch.Staff has observed that patient arrived with a new bottle of lorazepam and 20 tablets have been used within 24 hours.    Acute pancreatitis: Lipase trending downward.  Will allow her to eat.    Metastatic cancer of unknown primary, suspected pulmonary [facility]): Noted brain metastases as well.  For liver biopsy by interventional radiology, possibly tomorrow.    Overweight (BMI 25.0-29.9): Patient meets criteria BMI greater than 25.    Hypokalemia: Replacing as needed, secondary to Lasix.   Code Status: Full code  Family Communication: Updated daughter by phone  Disposition Plan: To be determined, based off of biopsy and plans.   Consultants:  Interventional radiology  Pulmonary  Procedures:  Planned interventional radiology biopsy  Echocardiogram done 59/16: Diastolic dysfunction, ejection fraction 35-40%  Antimicrobials:  IV Remdisivir 12/28-1/1  IV Zithromax 12/28-1/1  IV Rocephin 12/28-1/1  DVT prophylaxis: Lovenox   Objective: Vitals:   01/05/19 0757 01/05/19 1226  BP: 139/86 140/76  Pulse: 75 74  Resp: 13 16  Temp:  97.9 F (36.6 C)  SpO2: 94% 92%    Intake/Output Summary (Last 24 hours) at 01/05/2019 1648 Last data filed at 01/05/2019 0600 Gross per 24 hour  Intake 1072.5 ml  Output -  Net 1072.5 ml   Filed Weights   01/01/19 1253 01/02/19 0500 01/04/19 0500  Weight: 72.3 kg 72.5 kg 73.3 kg   Body mass index is 29.59 kg/m.  Exam:   General: Somnolent, no acute distress  HEENT: Normocephalic  and atraumatic, mucous membrane slightly dry  Neck: Supple, no JVD  Cardiovascular: Regular rate and rhythm, S1-S2  Respiratory: Decreased breath sounds bibasilar, more so at the bases  Abdomen: Soft, nontender, nondistended, positive bowel sounds  Musculoskeletal: No clubbing or cyanosis, trace pitting  edema bilaterally  Skin: No skin breaks, tears or lesions  Psychiatry: Somnolent, but no reports of acute psychosis  Neuro: No focal deficits   Data Reviewed: CBC: Recent Labs  Lab 01/01/19 0253 01/02/19 0240 01/03/19 0809 01/04/19 0910 01/05/19 0121  WBC 11.7* 11.4* 10.8* 8.7 9.6  NEUTROABS 10.1* 9.4* 8.8* 7.0 8.0*  HGB 11.0* 10.8* 10.4* 10.2* 10.8*  HCT 36.5 36.4 35.7* 34.6* 35.7*  MCV 84.3 84.1 85.8 85.4 84.4  PLT 269 283 269 236 734   Basic Metabolic Panel: Recent Labs  Lab 01/01/19 0253 01/02/19 0240 01/03/19 0809 01/04/19 0910 01/05/19 0121 01/05/19 1418  NA 140 141 143 140 138  --   K 3.3* 3.1* 3.5 3.1* 2.7* 2.8*  CL 99 97* 98 97* 93*  --   CO2 29 32 33* 32 33*  --   GLUCOSE 147* 90 73 102* 136*  --   BUN 21* 24* 30* 23* 21*  --   CREATININE 0.63 0.74 0.80 0.68 0.76  --   CALCIUM 7.7* 7.9* 8.0* 7.8* 7.7*  --   MG 1.6* 2.0 2.1 1.9 2.2  --   PHOS 2.0* 2.6 3.7 2.7 3.5  --    GFR: Estimated Creatinine Clearance: 70.9 mL/min (by C-G formula based on SCr of 0.76 mg/dL). Liver Function Tests: Recent Labs  Lab 01/01/19 0253 01/02/19 0240 01/03/19 0809 01/04/19 0910 01/05/19 0121  AST 41 49* 58* 56* 59*  ALT 30 34 33 30 30  ALKPHOS 175* 185* 215* 210* 222*  BILITOT 0.4 0.4 0.6 0.4 0.5  PROT 5.8* 5.5* 5.4* 5.4* 5.4*  ALBUMIN 1.9* 2.0* 1.8* 1.9* 1.9*   Recent Labs  Lab 01/03/19 0809 01/05/19 0121  LIPASE 81* 74*  AMYLASE 471*  --    No results for input(s): AMMONIA in the last 168 hours. Coagulation Profile: No results for input(s): INR, PROTIME in the last 168 hours. Cardiac Enzymes: No results for input(s): CKTOTAL, CKMB, CKMBINDEX, TROPONINI in the last 168 hours. BNP (last 3 results) No results for input(s): PROBNP in the last 8760 hours. HbA1C: No results for input(s): HGBA1C in the last 72 hours. CBG: Recent Labs  Lab 01/04/19 1155 01/04/19 2001 01/04/19 2300 01/05/19 0743 01/05/19 1120  GLUCAP 124* 137* 136* 138* 134*   Lipid  Profile: No results for input(s): CHOL, HDL, LDLCALC, TRIG, CHOLHDL, LDLDIRECT in the last 72 hours. Thyroid Function Tests: No results for input(s): TSH, T4TOTAL, FREET4, T3FREE, THYROIDAB in the last 72 hours. Anemia Panel: Recent Labs    01/04/19 0910 01/05/19 0121  FERRITIN 97 110   Urine analysis: No results found for: COLORURINE, APPEARANCEUR, LABSPEC, PHURINE, GLUCOSEU, HGBUR, BILIRUBINUR, KETONESUR, PROTEINUR, UROBILINOGEN, NITRITE, LEUKOCYTESUR Sepsis Labs: @LABRCNTIP (procalcitonin:4,lacticidven:4)  )No results found for this or any previous visit (from the past 240 hour(s)).    Studies: No results found.  Scheduled Meds: . vitamin C  500 mg Oral Daily  . aspirin  81 mg Oral Daily  . clopidogrel  75 mg Oral Daily  . dexamethasone (DECADRON) injection  6 mg Intravenous Q24H  . DULoxetine  30 mg Oral Daily  . enoxaparin (LOVENOX) injection  40 mg Subcutaneous Q24H  . famotidine  20 mg Oral Daily  . fentaNYL  1 patch Transdermal  Q72H  . furosemide  40 mg Intravenous BID  . insulin aspart  0-9 Units Subcutaneous TID WC  . insulin glargine  5 Units Subcutaneous Daily  . Ipratropium-Albuterol  1 puff Inhalation Q6H  . lisinopril  10 mg Oral Daily  . metoprolol succinate  50 mg Oral Daily  . pantoprazole  40 mg Oral BID  . polyethylene glycol  17 g Oral Daily  . potassium chloride  40 mEq Oral Q4H  . senna-docusate  2 tablet Oral QHS  . sodium chloride flush  3 mL Intravenous Q12H  . sucralfate  1 g Oral TID AC & HS  . zinc sulfate  220 mg Oral Daily    Continuous Infusions: . sodium chloride    . dextrose 5 % and 0.45% NaCl 75 mL (01/05/19 0540)     LOS: 6 days     Annita Brod, MD [facility]  To reach me or the doctor on call, go to: [email or web address] Password [facility]  01/05/2019, 4:48 PM

## 2019-01-05 NOTE — Progress Notes (Signed)
Pt assessment at start of shift, s/p zofran and morphine, both with notable good results.  Pt had continued pain approx 2230 ([id number]), tordal given with sufficient help. Pt sleeping on and off throughout the night, CBG's within normal limits. Pt continues NPO. VSS, afebrile. PIV's patent and infusing (2 in right arm/wrist). Pt is independent w/bed mobility.  Morphine just given for higher pain level complaints. Will monitor effects. Pt currently resting in bed, arousable and safe.

## 2019-01-06 DIAGNOSIS — C7951 Secondary malignant neoplasm of bone: Secondary | ICD-10-CM

## 2019-01-06 DIAGNOSIS — I5041 Acute combined systolic (congestive) and diastolic (congestive) heart failure: Secondary | ICD-10-CM

## 2019-01-06 LAB — GLUCOSE, CAPILLARY
Glucose-Capillary: 134 mg/dL — ABNORMAL HIGH (ref 70–99)
Glucose-Capillary: 134 mg/dL — ABNORMAL HIGH (ref 70–99)
Glucose-Capillary: 167 mg/dL — ABNORMAL HIGH (ref 70–99)
Glucose-Capillary: 153 mg/dL — ABNORMAL HIGH (ref 70–99)
Glucose-Capillary: 96 mg/dL (ref 70–99)

## 2019-01-06 LAB — CBC WITH DIFFERENTIAL/PLATELET
Abs Immature Granulocytes: 0.68 10*3/uL — ABNORMAL HIGH (ref 0.00–0.07)
Basophils Absolute: 0.1 10*3/uL (ref 0.0–0.1)
Basophils Relative: 0 %
Eosinophils Absolute: 0 10*3/uL (ref 0.0–0.5)
Eosinophils Relative: 0 %
HCT: 35 % — ABNORMAL LOW (ref 36.0–46.0)
Hemoglobin: 10.8 g/dL — ABNORMAL LOW (ref 12.0–15.0)
Immature Granulocytes: 6 %
Lymphocytes Relative: 7 %
Lymphs Abs: 0.8 10*3/uL (ref 0.7–4.0)
MCH: 25.9 pg — ABNORMAL LOW (ref 26.0–34.0)
MCHC: 30.9 g/dL (ref 30.0–36.0)
MCV: 83.9 fL (ref 80.0–100.0)
Monocytes Absolute: 0.6 10*3/uL (ref 0.1–1.0)
Monocytes Relative: 5 %
Neutro Abs: 9.7 10*3/uL — ABNORMAL HIGH (ref 1.7–7.7)
Neutrophils Relative %: 82 %
Platelets: 227 10*3/uL (ref 150–400)
RBC: 4.17 MIL/uL (ref 3.87–5.11)
RDW: 15.7 % — ABNORMAL HIGH (ref 11.5–15.5)
WBC: 11.8 10*3/uL — ABNORMAL HIGH (ref 4.0–10.5)
nRBC: 0.2 % (ref 0.0–0.2)

## 2019-01-06 LAB — LIPASE, BLOOD: Lipase: 97 U/L — ABNORMAL HIGH (ref 11–51)

## 2019-01-06 LAB — COMPREHENSIVE METABOLIC PANEL
ALT: 29 U/L (ref 0–44)
AST: 53 U/L — ABNORMAL HIGH (ref 15–41)
Albumin: 2 g/dL — ABNORMAL LOW (ref 3.5–5.0)
Alkaline Phosphatase: 203 U/L — ABNORMAL HIGH (ref 38–126)
Anion gap: 11 (ref 5–15)
BUN: 20 mg/dL (ref 6–20)
CO2: 33 mmol/L — ABNORMAL HIGH (ref 22–32)
Calcium: 8 mg/dL — ABNORMAL LOW (ref 8.9–10.3)
Chloride: 92 mmol/L — ABNORMAL LOW (ref 98–111)
Creatinine, Ser: 0.59 mg/dL (ref 0.44–1.00)
GFR calc Af Amer: >60 mL/min (ref >60)
GFR calc non Af Amer: >60 mL/min (ref >60)
Glucose, Bld: 108 mg/dL — ABNORMAL HIGH (ref 70–99)
Potassium: 3.2 mmol/L — ABNORMAL LOW (ref 3.5–5.1)
Sodium: 136 mmol/L (ref 135–145)
Total Bilirubin: 0.7 mg/dL (ref 0.3–1.2)
Total Protein: 5.4 g/dL — ABNORMAL LOW (ref 6.5–8.1)

## 2019-01-06 LAB — PHOSPHORUS: Phosphorus: 1.5 mg/dL — ABNORMAL LOW (ref 2.5–4.6)

## 2019-01-06 LAB — C-REACTIVE PROTEIN: CRP: 0.6 mg/dL (ref <1.0)

## 2019-01-06 LAB — MAGNESIUM: Magnesium: 1.8 mg/dL (ref 1.7–2.4)

## 2019-01-06 LAB — D-DIMER, QUANTITATIVE: D-Dimer, Quant: 2.22 ug/mL-FEU — ABNORMAL HIGH (ref 0.00–0.50)

## 2019-01-06 LAB — FERRITIN: Ferritin: 127 ng/mL (ref 11–307)

## 2019-01-06 LAB — BRAIN NATRIURETIC PEPTIDE: B Natriuretic Peptide: 226.4 pg/mL — ABNORMAL HIGH (ref 0.0–100.0)

## 2019-01-06 MED ORDER — DEXAMETHASONE 4 MG PO TABS
4.0000 mg | ORAL_TABLET | Freq: Every day | ORAL | Status: DC
  Administered 2019-01-06: 13:00:00 4 mg via ORAL
  Filled 2019-01-06 (×3): qty 1

## 2019-01-06 MED ORDER — POTASSIUM CHLORIDE CRYS ER 20 MEQ PO TBCR
40.0000 meq | EXTENDED_RELEASE_TABLET | Freq: Every day | ORAL | Status: DC
  Administered 2019-01-06 – 2019-01-11 (×6): 40 meq via ORAL
  Filled 2019-01-06 (×6): qty 2

## 2019-01-06 MED ORDER — INSULIN GLARGINE 100 UNIT/ML ~~LOC~~ SOLN
3.0000 [IU] | Freq: Every day | SUBCUTANEOUS | Status: DC
  Administered 2019-01-07 – 2019-01-10 (×4): 3 [IU] via SUBCUTANEOUS
  Filled 2019-01-06 (×4): qty 0.03

## 2019-01-06 NOTE — Progress Notes (Signed)
Occupational Therapy Treatment Patient Details Name: Emily Thomas MRN: [id number] DOB: 12/17/1960 Today's Date: 01/06/2019    History of present illness 59 y.o. female with medical history significant of chronic back pain due to degenerative disc disease, DM-2, HTN, history of remote pulmonary embolism no longer on anticoagulation, CAD s/p PCI approximately 1 year back-chronic epigastric pain-being worked up in the outpatient setting (EGD/colonoscopy scheduled)-presenting as a transfer from [place] for evaluation of acute hypoxic respiratory failure in the setting of COVID-19 pneumonia.   OT comments  Pt continues to make progress in therapy demonstrating improved activity tolerance and independence with self-care and functional transfer tasks. Pt also requiring less supplemental oxygen as compared to previous session. Pt able to ambulate to/from bathroom and complete toileting task as well as grooming/hygiene at the sink. Pt tolerated standing 1 x 3 min at the sink with supervision. SpO2 decreased to mid 80s on room air during activity, noting quick return back to 90s following seated rest break. 2/4 DOE. Pt on room air at end of session with SpO2 94%. RN updated on pt's progress. Pt continues to require cues for safety throughout. Pt's pain continues to be primary limiting factor. OT will continue to follow acutely.   Follow Up Recommendations  Supervision - Intermittent;Home health OT    Equipment Recommendations  3 in 1 bedside commode(for use in shower)    Recommendations for Other Services      Precautions / Restrictions Precautions Precautions: Fall Restrictions Weight Bearing Restrictions: No       Mobility Bed Mobility Overal bed mobility: Modified Independent             General bed mobility comments: HOB elevated, use of bedrail  Transfers Overall transfer level: Needs assistance Equipment used: None Transfers: Sit to/from Stand Sit to Stand:  Supervision         General transfer comment: Cues for safety    Balance Overall balance assessment: Mild deficits observed, not formally tested                                         ADL either performed or assessed with clinical judgement   ADL Overall ADL's : Needs assistance/impaired     Grooming: Supervision/safety;Set up;Standing;Wash/dry hands;Wash/dry face;Oral care               Lower Body Dressing: Set up;Supervision/safety;Sitting/lateral leans   Toilet Transfer: Supervision/safety;Set up;Regular Toilet;Ambulation;Grab bars   Toileting- Clothing Manipulation and Hygiene: Set up;Supervision/safety;Sit to/from stand       Functional mobility during ADLs: Min guard General ADL Comments: Pt able to ambulate to/from bathroom with min guard and without use of an assistive device. Noted 0 instances of loss of balance, however pt unsteady on feet requiring cues for safety.     Vision       Perception     Praxis      Cognition Arousal/Alertness: Lethargic Behavior During Therapy: Restless;Flat affect Overall Cognitive Status: No family/caregiver present to determine baseline cognitive functioning                                          Exercises Exercises: Other exercises Other Exercises Other Exercises: Incentive spirometer x 10 with min cues on technique. Pulling 397mL.   Shoulder Instructions  General Comments Pt on 2L [place] upon OT arrival. Pt able to ambulate to/from bathroom and complete grooming/hygiene tasks at the sink on room air. SpO2 decreased to mid 80s with task, noting quick return back to 90s following seated rest break. Pt on room air at end of session with SpO2 95%. RN updated.    Pertinent Vitals/ Pain       Pain Assessment: 0-10 Pain Score: 10-Worst pain ever Pain Location: abdomen and back Pain Descriptors / Indicators: Constant Pain Intervention(s): Limited activity within patient's  tolerance;Monitored during session;Repositioned;Other (comment)(RN aware, pain meds provided prior to session)  Home Living                                          Prior Functioning/Environment              Frequency           Progress Toward Goals  OT Goals(current goals can now be found in the care plan section)  Progress towards OT goals: Progressing toward goals  ADL Goals Pt Will Perform Grooming: Independently;standing Pt Will Perform Lower Body Bathing: Independently;sit to/from stand Pt Will Perform Lower Body Dressing: Independently;sit to/from stand Pt Will Transfer to Toilet: Independently;ambulating;regular height toilet Pt Will Perform Toileting - Clothing Manipulation and hygiene: Independently;sit to/from stand Additional ADL Goal #1: Pt to recall and verbalize 3 energy conservation techniques with 0 verbal cues. Additional ADL Goal #2: Pt to tolerate standing up to 5 min independently with SpO2 maintaining above 90%, in preparation for ADLs.  Plan Discharge plan remains appropriate    Co-evaluation                 AM-PAC OT "6 Clicks" Daily Activity     Outcome Measure   Help from another person eating meals?: None Help from another person taking care of personal grooming?: A Little Help from another person toileting, which includes using toliet, bedpan, or urinal?: A Little Help from another person bathing (including washing, rinsing, drying)?: A Little Help from another person to put on and taking off regular upper body clothing?: A Little Help from another person to put on and taking off regular lower body clothing?: A Little 6 Click Score: 19    End of Session Equipment Utilized During Treatment: Oxygen  OT Visit Diagnosis: Unsteadiness on feet (R26.81);Muscle weakness (generalized) (M62.81);Pain   Activity Tolerance Patient limited by pain;Patient limited by fatigue   Patient Left in chair;with call bell/phone within  reach;with chair alarm set   Nurse Communication Mobility status        Time: 0160-1093 OT Time Calculation (min): 27 min  Charges: OT General Charges $OT Visit: 1 Visit OT Treatments $Self Care/Home Management : 8-22 mins $Therapeutic Activity: 8-22 mins  Mauri Brooklyn OTR/L [phone]    Mauri Brooklyn 01/06/2019, 2:09 PM

## 2019-01-06 NOTE — Progress Notes (Signed)
Pt stable this shift. Continues to have/complain of pain. Morphine and tordal with notable good results, ativan PRN helps much as well. As pt becomes very anxious with the "on schedule" dosing of her pain meds. Pts VSS, continues on [place]. Afebrile, taking clear liquids and ice chips throughout the night.  Pt gets out of bed to commode, this RN encourages pts need for supervision with transfer, pt agreeable but doesn't always "ring" for the nurses help. Pt had good results with senna last pm, moved bowels. Urination noted as well, clear yellow. Sufficient amt in comparison to her intake. CBGs have been under 200's, pt denies any hyperglycemic concerns.

## 2019-01-06 NOTE — Progress Notes (Signed)
PROGRESS NOTE  KERSTIE AGENT IDP:[id number] DOB: 1960/09/01 DOA: 12/30/2018 PCP: Patient, No Pcp Per  HPI/Recap of past 9 hours: 59 year old female with past medical history of chronic back pain, remote PE no longer on anticoagulation, tobacco abuse and hypertension who was transferred from [place] for Covid pneumonia with secondary hypoxia.  Patient states that she has been fatigued with shortness of breath for at least a week since admission.  Patient has been on Decadron and remdesivir.  Patient's course also complicated by issue of acute pancreatitis.  Following admission, patient underwent an echocardiogram on 44/31 noting systolic/diastolic heart failure.  She has been treated with IV Lasix.  On 1/1, patient underwent an abdominal CT which noted evidence of a liver mass and evidence of a lung mass.  This was confirmed by chest CT highly suspicious for a right lower lobe metastatic lung cancer.  CT scan of the head suspicious for metastases in the left occipital lobe.    Patient able to be weaned down to 2 L nasal cannula.  She was restarted on p.o. and has been able to tolerate food as of 1/3 evening.  She still complains of a fair amount of pain, mostly in her back and mid epigastric area.  Interventional radiology followed up and they are unable to do her liver biopsy until she has been cleared from a Covid standpoint.  Assessment/Plan: Principal Problem:   Acute respiratory failure with hypoxemia (HCC) secondary to COVID-19 and acute diastolic/systolic CHF: Status post remdesivir, continue steroids and given low CRP and minimal oxygen requirements, will start to wean down steroids.  Oxygenation much improving, down from 5 L to 2L.  Continue Lasix.  Echocardiogram done 12/31 note ejection fraction 35-40% with global hypokinesis and grade 1 diastolic dysfunction.  She is bonding well to Lasix, diuresing over 7 L.  There is a question as to her weight accuracy as she is 12 pounds  heavier from the previous measurement 2 days prior.  However, her oxygenation is less and BNP is also slightly lower. Active Problems:   Essential hypertension: Blood pressure stable.    Diabetes mellitus type 2, uncontrolled, with complications ([place]): Trending Lantus downward now that we are decreasing her steroids    Chronic epigastric pain: I suspect that a lot of this is bony metastases and perhaps some mild pancreatitis.    Tobacco abuse: Counseled.  Nicotine patch.    Drug-seeking behavior/chronic back pain: Has been on methadone oxycodone in the past.  Currently p.o. pain medications on hold.  On as needed Toradol and IV morphine plus fentanyl patch.Staff has observed that patient arrived with a new bottle of lorazepam and 20 tablets have been used within 24 hours.    Acute pancreatitis: Lipase trending downward.  Restarted p.o. 1/3 evening.  Tolerated.    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  For liver biopsy by interventional radiology, this will be done as an outpatient once patient is no longer felt to be contagious for Covid.  Palliative care has been consulted, they will see the patient.  Decreased p.o. intake, low albumin.  Nutrition to see.    Overweight (BMI 25.0-29.9): Patient meets criteria BMI greater than 25.    Hypokalemia: Replacing as needed, secondary to Lasix.  Have added scheduled potassium daily   Code Status: Full code  Family Communication: Updated daughter by phone  Disposition Plan: Likely home once we have fully diuresed her and palliative care plan in place  Consultants:  Interventional radiology  Pulmonary  Palliative care pending  Procedures:  Planned interventional radiology biopsy  Echocardiogram done 28/31: Diastolic dysfunction, ejection fraction 35-40%  Antimicrobials:  IV Remdisivir 12/28-1/1  IV Zithromax 12/28-1/1  IV Rocephin 12/28-1/1  DVT  prophylaxis: Lovenox   Objective: Vitals:   01/06/19 0500 01/06/19 0545  BP:  139/81  Pulse: 65   Resp: 10   Temp:  97.7 F (36.5 C)  SpO2: 90%     Intake/Output Summary (Last 24 hours) at 01/06/2019 1247 Last data filed at 01/06/2019 0500 Gross per 24 hour  Intake 2765 ml  Output 2702 ml  Net 63 ml   Filed Weights   01/02/19 0500 01/04/19 0500 01/06/19 0500  Weight: 72.5 kg 73.3 kg 78.2 kg   Body mass index is 31.56 kg/m.  Exam:   General: Fatigued, oriented x2, no acute distress  HEENT: Normocephalic and atraumatic, mucous membrane slightly dry  Neck: Supple, no JVD  Cardiovascular: Regular rate and rhythm, S1-S2  Respiratory: Decreased breath sounds bibasilar, only low to moderate inspiratory effort  Abdomen: Soft, nontender, nondistended, positive bowel sounds  Musculoskeletal: No clubbing or cyanosis, trace pitting edema bilaterally  Skin: No skin breaks, tears or lesions  Psychiatry: Appropriate, no evidence of acute psychoses  Neuro: No focal deficits   Data Reviewed: CBC: Recent Labs  Lab 01/02/19 0240 01/03/19 0809 01/04/19 0910 01/05/19 0121 01/06/19 0550  WBC 11.4* 10.8* 8.7 9.6 11.8*  NEUTROABS 9.4* 8.8* 7.0 8.0* 9.7*  HGB 10.8* 10.4* 10.2* 10.8* 10.8*  HCT 36.4 35.7* 34.6* 35.7* 35.0*  MCV 84.1 85.8 85.4 84.4 83.9  PLT 283 269 236 245 517   Basic Metabolic Panel: Recent Labs  Lab 01/02/19 0240 01/03/19 0809 01/04/19 0910 01/05/19 0121 01/05/19 1418 01/06/19 0550  NA 141 143 140 138  --  136  K 3.1* 3.5 3.1* 2.7* 2.8* 3.2*  CL 97* 98 97* 93*  --  92*  CO2 32 33* 32 33*  --  33*  GLUCOSE 90 73 102* 136*  --  108*  BUN 24* 30* 23* 21*  --  20  CREATININE 0.74 0.80 0.68 0.76  --  0.59  CALCIUM 7.9* 8.0* 7.8* 7.7*  --  8.0*  MG 2.0 2.1 1.9 2.2  --  1.8  PHOS 2.6 3.7 2.7 3.5  --  1.5*   GFR: Estimated Creatinine Clearance: 73.3 mL/min (by C-G formula based on SCr of 0.59 mg/dL). Liver Function Tests: Recent Labs  Lab  01/02/19 0240 01/03/19 0809 01/04/19 0910 01/05/19 0121 01/06/19 0550  AST 49* 58* 56* 59* 53*  ALT 34 33 30 30 29   ALKPHOS 185* 215* 210* 222* 203*  BILITOT 0.4 0.6 0.4 0.5 0.7  PROT 5.5* 5.4* 5.4* 5.4* 5.4*  ALBUMIN 2.0* 1.8* 1.9* 1.9* 2.0*   Recent Labs  Lab 01/03/19 0809 01/05/19 0121 01/06/19 0550  LIPASE 81* 74* 97*  AMYLASE 471*  --   --    No results for input(s): AMMONIA in the last 168 hours. Coagulation Profile: No results for input(s): INR, PROTIME in the last 168 hours. Cardiac Enzymes: No results for input(s): CKTOTAL, CKMB, CKMBINDEX, TROPONINI in the last 168 hours. BNP (last 3 results) No results for input(s): PROBNP in the last 8760 hours. HbA1C: No results for input(s): HGBA1C in the last 72 hours. CBG: Recent Labs  Lab 01/05/19 1643 01/05/19 2111 01/06/19 0557 01/06/19 0835 01/06/19 1115  GLUCAP 125* 235* 134* 134* 167*   Lipid Profile: No results for input(s):  CHOL, HDL, LDLCALC, TRIG, CHOLHDL, LDLDIRECT in the last 72 hours. Thyroid Function Tests: No results for input(s): TSH, T4TOTAL, FREET4, T3FREE, THYROIDAB in the last 72 hours. Anemia Panel: Recent Labs    01/05/19 0121 01/06/19 0550  FERRITIN 110 127   Urine analysis: No results found for: COLORURINE, APPEARANCEUR, LABSPEC, PHURINE, GLUCOSEU, HGBUR, BILIRUBINUR, KETONESUR, PROTEINUR, UROBILINOGEN, NITRITE, LEUKOCYTESUR Sepsis Labs: @LABRCNTIP (procalcitonin:4,lacticidven:4)  )No results found for this or any previous visit (from the past 240 hour(s)).    Studies: No results found.  Scheduled Meds: . vitamin C  500 mg Oral Daily  . aspirin  81 mg Oral Daily  . clopidogrel  75 mg Oral Daily  . dexamethasone  4 mg Oral Daily  . DULoxetine  30 mg Oral Daily  . enoxaparin (LOVENOX) injection  40 mg Subcutaneous Q24H  . famotidine  20 mg Oral Daily  . fentaNYL  1 patch Transdermal Q72H  . furosemide  40 mg Intravenous BID  . insulin aspart  0-9 Units Subcutaneous TID WC  .  [START ON 01/07/2019] insulin glargine  3 Units Subcutaneous Daily  . Ipratropium-Albuterol  1 puff Inhalation Q6H  . lisinopril  10 mg Oral Daily  . metoprolol succinate  50 mg Oral Daily  . pantoprazole  40 mg Oral BID  . polyethylene glycol  17 g Oral Daily  . potassium chloride  40 mEq Oral Daily  . senna-docusate  2 tablet Oral QHS  . sodium chloride flush  3 mL Intravenous Q12H  . sucralfate  1 g Oral TID AC & HS  . zinc sulfate  220 mg Oral Daily    Continuous Infusions: . sodium chloride    . dextrose 5 % and 0.45% NaCl 75 mL/hr at 01/06/19 0923     LOS: 7 days     Annita Brod, MD [facility]  To reach me or the doctor on call, go to: [email or web address] Password [facility]  01/06/2019, 12:47 PM

## 2019-01-07 LAB — COMPREHENSIVE METABOLIC PANEL
ALT: 35 U/L (ref 0–44)
AST: 81 U/L — ABNORMAL HIGH (ref 15–41)
Albumin: 2.1 g/dL — ABNORMAL LOW (ref 3.5–5.0)
Alkaline Phosphatase: 267 U/L — ABNORMAL HIGH (ref 38–126)
Anion gap: 14 (ref 5–15)
BUN: 18 mg/dL (ref 6–20)
CO2: 33 mmol/L — ABNORMAL HIGH (ref 22–32)
Calcium: 8 mg/dL — ABNORMAL LOW (ref 8.9–10.3)
Chloride: 90 mmol/L — ABNORMAL LOW (ref 98–111)
Creatinine, Ser: 0.7 mg/dL (ref 0.44–1.00)
GFR calc Af Amer: >60 mL/min (ref >60)
GFR calc non Af Amer: >60 mL/min (ref >60)
Glucose, Bld: 68 mg/dL — ABNORMAL LOW (ref 70–99)
Potassium: 2.7 mmol/L — CL (ref 3.5–5.1)
Sodium: 137 mmol/L (ref 135–145)
Total Bilirubin: 0.6 mg/dL (ref 0.3–1.2)
Total Protein: 5.5 g/dL — ABNORMAL LOW (ref 6.5–8.1)

## 2019-01-07 LAB — CBC WITH DIFFERENTIAL/PLATELET
Abs Immature Granulocytes: 0.89 10*3/uL — ABNORMAL HIGH (ref 0.00–0.07)
Basophils Absolute: 0.1 10*3/uL (ref 0.0–0.1)
Basophils Relative: 1 %
Eosinophils Absolute: 0 10*3/uL (ref 0.0–0.5)
Eosinophils Relative: 0 %
HCT: 36.4 % (ref 36.0–46.0)
Hemoglobin: 11.2 g/dL — ABNORMAL LOW (ref 12.0–15.0)
Immature Granulocytes: 7 %
Lymphocytes Relative: 8 %
Lymphs Abs: 1 10*3/uL (ref 0.7–4.0)
MCH: 25.7 pg — ABNORMAL LOW (ref 26.0–34.0)
MCHC: 30.8 g/dL (ref 30.0–36.0)
MCV: 83.7 fL (ref 80.0–100.0)
Monocytes Absolute: 0.7 10*3/uL (ref 0.1–1.0)
Monocytes Relative: 6 %
Neutro Abs: 9.7 10*3/uL — ABNORMAL HIGH (ref 1.7–7.7)
Neutrophils Relative %: 78 %
Platelets: 212 10*3/uL (ref 150–400)
RBC: 4.35 MIL/uL (ref 3.87–5.11)
RDW: 15.9 % — ABNORMAL HIGH (ref 11.5–15.5)
WBC: 12.4 10*3/uL — ABNORMAL HIGH (ref 4.0–10.5)
nRBC: 0.2 % (ref 0.0–0.2)

## 2019-01-07 LAB — PHOSPHORUS: Phosphorus: 2.3 mg/dL — ABNORMAL LOW (ref 2.5–4.6)

## 2019-01-07 LAB — GLUCOSE, CAPILLARY
Glucose-Capillary: 78 mg/dL (ref 70–99)
Glucose-Capillary: 141 mg/dL — ABNORMAL HIGH (ref 70–99)
Glucose-Capillary: 120 mg/dL — ABNORMAL HIGH (ref 70–99)
Glucose-Capillary: 134 mg/dL — ABNORMAL HIGH (ref 70–99)

## 2019-01-07 LAB — MAGNESIUM: Magnesium: 1.7 mg/dL (ref 1.7–2.4)

## 2019-01-07 LAB — D-DIMER, QUANTITATIVE: D-Dimer, Quant: 3 ug/mL-FEU — ABNORMAL HIGH (ref 0.00–0.50)

## 2019-01-07 LAB — C-REACTIVE PROTEIN: CRP: 0.8 mg/dL (ref <1.0)

## 2019-01-07 LAB — FERRITIN: Ferritin: 161 ng/mL (ref 11–307)

## 2019-01-07 LAB — LIPASE, BLOOD: Lipase: 63 U/L — ABNORMAL HIGH (ref 11–51)

## 2019-01-07 MED ORDER — DEXAMETHASONE 4 MG PO TABS
2.0000 mg | ORAL_TABLET | Freq: Every day | ORAL | Status: DC
  Administered 2019-01-07 – 2019-01-16 (×10): 2 mg via ORAL
  Filled 2019-01-07 (×6): qty 1
  Filled 2019-01-07: qty 0.5
  Filled 2019-01-07 (×4): qty 1

## 2019-01-07 MED ORDER — MAGNESIUM OXIDE 400 (241.3 MG) MG PO TABS
400.0000 mg | ORAL_TABLET | Freq: Once | ORAL | Status: AC
  Administered 2019-01-07: 400 mg via ORAL
  Filled 2019-01-07: qty 1

## 2019-01-07 MED ORDER — POTASSIUM CHLORIDE CRYS ER 20 MEQ PO TBCR
40.0000 meq | EXTENDED_RELEASE_TABLET | Freq: Once | ORAL | Status: AC
  Administered 2019-01-07: 40 meq via ORAL
  Filled 2019-01-07: qty 2

## 2019-01-07 MED ORDER — MORPHINE SULFATE ER 30 MG PO TBCR
30.0000 mg | EXTENDED_RELEASE_TABLET | Freq: Two times a day (BID) | ORAL | Status: DC
  Administered 2019-01-07 – 2019-01-10 (×7): 30 mg via ORAL
  Filled 2019-01-07 (×7): qty 1

## 2019-01-07 MED ORDER — ENSURE ENLIVE PO LIQD
237.0000 mL | Freq: Two times a day (BID) | ORAL | Status: DC
  Administered 2019-01-07 – 2019-01-17 (×9): 237 mL via ORAL

## 2019-01-07 NOTE — Plan of Care (Signed)
Lab called Critical potassium of 2.7 - Patient RN informed.

## 2019-01-07 NOTE — Progress Notes (Signed)
Initial Nutrition Assessment  DOCUMENTATION CODES:   Not applicable  INTERVENTION:   Please consider libralizing diet (current diet of Heart Healthy/CHO Modified is limited to 1590 kcal) her intake is 25% of meals (consuming ~ 400 kcal)   Ensure Enlive po BID, each supplement provides 350 kcal and 20 grams of protein  Pt receiving Magic cup BID with lunch and dinner, each supplement provides 290 kcal and 9 grams of protein, automatically on meal trays to optimize nutritional intake.    NUTRITION DIAGNOSIS:   Increased nutrient needs related to catabolic KXFGHWE([id number]; new metastatic cancer) as evidenced by estimated needs.  GOAL:   Patient will meet greater than or equal to 90% of their needs  MONITOR:   PO intake, Supplement acceptance, Labs  REASON FOR ASSESSMENT:   Consult Assessment of nutrition requirement/status  ASSESSMENT:   Pt with PMH of chronic back pain, remote PE, tobacco abuse, HTN, uncontrolled DM who was admitted with hypoxia due to COVID-19 PNA during admission pt dx with new CHF on IV lasix, acute pancreatitis with down trending labs, and new metastatic cancer of unknown primary (suspect pulmonary with liver mass and brain mets noted).   Pt O2 requirement has decreased to 2L [place].  Palliative care consult pending 1/3 diet advanced to Heart Healthy/CHO modified, pt consuming 25% of meals   Medications reviewed and include: vitamin c, decadron, pepcid, lasix, SSI TID with meals, 3 units lantus daily, miralax, KCl 40 mEq daily, senokot-s, carafate TID with meals and bedtime, zinc  IV: D5 1/2 NS @ 75 ml/hr Labs reviewed: K+ 2.7 (L) CBG's: [phone]    NUTRITION - FOCUSED PHYSICAL EXAM:  Deferred  Diet Order:   Diet Order            Diet heart healthy/carb modified Room service appropriate? Yes; Fluid consistency: Thin  Diet effective now              EDUCATION NEEDS:   No education needs have been identified at this time  Skin:  Skin  Assessment: Reviewed RN Assessment  Last BM:  1/5 small type 5  Height:   Ht Readings from Last 1 Encounters:  12/30/18 5' 1.97" (1.574 m)    Weight:   Wt Readings from Last 1 Encounters:  01/07/19 68.1 kg    Ideal Body Weight:  50 kg  BMI:  Body mass index is 27.49 kg/m.  Estimated Nutritional Needs:   Kcal:  2300-2500  Protein:  100-115 grams  Fluid:  >/= 2/day  Maylon Peppers RD, LDN, CNSC [phone] Pager [phone] After Hours Pager

## 2019-01-07 NOTE — Plan of Care (Signed)
  Problem: Education: Goal: Knowledge of risk factors and measures for prevention of condition will improve Outcome: Progressing   Problem: Coping: Goal: Psychosocial and spiritual needs will be supported Outcome: Progressing   Problem: Respiratory: Goal: Will maintain a patent airway Outcome: Progressing Goal: Complications related to the disease process, condition or treatment will be avoided or minimized Outcome: Progressing   

## 2019-01-07 NOTE — Progress Notes (Signed)
PROGRESS NOTE  Emily Thomas YFV:[id number] DOB: [birth year] DOA: 12/30/2018 PCP: Patient, No Pcp Per  HPI/Recap of past 44 hours: 59 year old female with past medical history of chronic back pain, remote PE no longer on anticoagulation, tobacco abuse and hypertension who was transferred from [place] for Covid pneumonia with secondary hypoxia.  Patient states that she has been fatigued with shortness of breath for at least a week since admission.  Patient has been on Decadron and remdesivir.  Patient's course also complicated by issue of acute pancreatitis.  Following admission, patient underwent an echocardiogram on 16/38 noting systolic/diastolic heart failure.  She has been treated with IV Lasix.  On 1/1, patient underwent an abdominal CT which noted evidence of a liver mass and evidence of a lung mass.  This was confirmed by chest CT highly suspicious for a right lower lobe metastatic lung cancer.  CT scan of the head suspicious for metastases in the left occipital lobe.   Interventional radiology followed up and they are unable to do her liver biopsy until she has been cleared from a Covid standpoint.  Patient able to be weaned off of oxygen as of 1/5.  She still complains of pain, somewhat in her midepigastric area, but mostly in her back.  Breathing is actually okay.  Assessment/Plan: Principal Problem:   Acute respiratory failure with hypoxemia (HCC) secondary to COVID-19 and acute diastolic/systolic CHF: Status post remdesivir, continue steroids and given low CRP and minimal oxygen requirements, will start to wean down steroids.  At this point, she has been able to be weaned off of oxygen.  Continue Lasix.  Echocardiogram done 12/31 note ejection fraction 35-40% with global hypokinesis and grade 1 diastolic dysfunction.  She is bonding well to Lasix, diuresing over 4 L.  There is a question as to her weight accuracy as she is 12 pounds heavier from the previous measurement 2 days  prior.  However, her oxygenation is less and BNP is also slightly lower.  Going to stop her Lasix for now given her bicarb trending upward and I do not want her to get hyperchloremic any further. Active Problems:   Essential hypertension: Blood pressure stable.    Diabetes mellitus type 2, uncontrolled, with complications ([place]): Trending Lantus downward now that we are decreasing her steroids  Epigastric pain/back pain: I suspect that a lot of this is bony metastases and perhaps some mild pancreatitis.  Waiting for palliative care for symptom management recommendations, in the meantime have started extended release MS Contin    Tobacco abuse: Counseled.  Nicotine patch.    Drug-seeking behavior: Has been on methadone oxycodone in the past.  Currently p.o. pain medications on hold.  On as needed Toradol and IV morphine plus fentanyl patch.Staff has observed that patient arrived with a new bottle of lorazepam and 20 tablets have been used within 24 hours.  This may all be irrelevant given her metastatic cancer.    Acute pancreatitis: Lipase trending downward.  Restarted p.o. 1/3 evening.  Tolerated.    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  For liver biopsy by interventional radiology, this will be done as an outpatient once patient is no longer felt to be contagious for Covid.  Palliative care has been consulted, they will see the patient.  Decreased p.o. intake, low albumin.  Nutrition to see.    Overweight (BMI 25.0-29.9): Patient meets criteria BMI greater than 25.    Hypokalemia: Replacing as needed, secondary to  Lasix.  Have added scheduled potassium daily   Code Status: Full code  Family Communication: Left message for daughter in law  Disposition Plan: Potentially home tomorrow or the next day.   Consultants:  Interventional radiology  Pulmonary  Palliative care pending  Procedures:   Echocardiogram done 12/75:  Diastolic dysfunction, ejection fraction 35-40%  Antimicrobials:  IV Remdisivir 12/28-1/1  IV Zithromax 12/28-1/1  IV Rocephin 12/28-1/1  DVT prophylaxis: Lovenox   Objective: Vitals:   01/07/19 0756 01/07/19 1600  BP: (!) 151/89 108/70  Pulse: 72 66  Resp: 18 16  Temp: 98.3 F (36.8 C) 98.1 F (36.7 C)  SpO2: 94% 92%    Intake/Output Summary (Last 24 hours) at 01/07/2019 1707 Last data filed at 01/07/2019 0400 Gross per 24 hour  Intake 1480 ml  Output --  Net 1480 ml   Filed Weights   01/04/19 0500 01/06/19 0500 01/07/19 0500  Weight: 73.3 kg 78.2 kg 68.1 kg   Body mass index is 27.49 kg/m.  Exam:   General: Fatigued, oriented x2, mild to moderate distress from pain  HEENT: Normocephalic and atraumatic, mucous membrane slightly dry  Neck: Supple, no JVD  Cardiovascular: Regular rate and rhythm, S1-S2  Respiratory: Decreased breath sounds bibasilar, only low to moderate inspiratory effort  Abdomen: Soft, nontender, nondistended, positive bowel sounds  Musculoskeletal: No clubbing or cyanosis, trace pitting edema bilaterally  Skin: No skin breaks, tears or lesions  Psychiatry: Appropriate, no evidence of acute psychoses  Neuro: No focal deficits   Data Reviewed: CBC: Recent Labs  Lab 01/03/19 0809 01/04/19 0910 01/05/19 0121 01/06/19 0550 01/07/19 0620  WBC 10.8* 8.7 9.6 11.8* 12.4*  NEUTROABS 8.8* 7.0 8.0* 9.7* 9.7*  HGB 10.4* 10.2* 10.8* 10.8* 11.2*  HCT 35.7* 34.6* 35.7* 35.0* 36.4  MCV 85.8 85.4 84.4 83.9 83.7  PLT 269 236 245 227 170   Basic Metabolic Panel: Recent Labs  Lab 01/03/19 0809 01/04/19 0910 01/05/19 0121 01/05/19 1418 01/06/19 0550 01/07/19 0620  NA 143 140 138  --  136 137  K 3.5 3.1* 2.7* 2.8* 3.2* 2.7*  CL 98 97* 93*  --  92* 90*  CO2 33* 32 33*  --  33* 33*  GLUCOSE 73 102* 136*  --  108* 68*  BUN 30* 23* 21*  --  20 18  CREATININE 0.80 0.68 0.76  --  0.59 0.70  CALCIUM 8.0* 7.8* 7.7*  --  8.0* 8.0*  MG  2.1 1.9 2.2  --  1.8 1.7  PHOS 3.7 2.7 3.5  --  1.5* 2.3*   GFR: Estimated Creatinine Clearance: 68.4 mL/min (by C-G formula based on SCr of 0.7 mg/dL). Liver Function Tests: Recent Labs  Lab 01/03/19 0809 01/04/19 0910 01/05/19 0121 01/06/19 0550 01/07/19 0620  AST 58* 56* 59* 53* 81*  ALT 33 30 30 29  35  ALKPHOS 215* 210* 222* 203* 267*  BILITOT 0.6 0.4 0.5 0.7 0.6  PROT 5.4* 5.4* 5.4* 5.4* 5.5*  ALBUMIN 1.8* 1.9* 1.9* 2.0* 2.1*   Recent Labs  Lab 01/03/19 0809 01/05/19 0121 01/06/19 0550 01/07/19 0620  LIPASE 81* 74* 97* 63*  AMYLASE 471*  --   --   --    No results for input(s): AMMONIA in the last 168 hours. Coagulation Profile: No results for input(s): INR, PROTIME in the last 168 hours. Cardiac Enzymes: No results for input(s): CKTOTAL, CKMB, CKMBINDEX, TROPONINI in the last 168 hours. BNP (last 3 results) No results for input(s): PROBNP in the last 8760  hours. HbA1C: No results for input(s): HGBA1C in the last 72 hours. CBG: Recent Labs  Lab 01/06/19 1115 01/06/19 1735 01/06/19 2214 01/07/19 0800 01/07/19 1147  GLUCAP 167* 153* 96 78 141*   Lipid Profile: No results for input(s): CHOL, HDL, LDLCALC, TRIG, CHOLHDL, LDLDIRECT in the last 72 hours. Thyroid Function Tests: No results for input(s): TSH, T4TOTAL, FREET4, T3FREE, THYROIDAB in the last 72 hours. Anemia Panel: Recent Labs    01/06/19 0550 01/07/19 0620  FERRITIN 127 161   Urine analysis: No results found for: COLORURINE, APPEARANCEUR, LABSPEC, PHURINE, GLUCOSEU, HGBUR, BILIRUBINUR, KETONESUR, PROTEINUR, UROBILINOGEN, NITRITE, LEUKOCYTESUR Sepsis Labs: @LABRCNTIP (procalcitonin:4,lacticidven:4)  )No results found for this or any previous visit (from the past 240 hour(s)).    Studies: No results found.  Scheduled Meds: . vitamin C  500 mg Oral Daily  . aspirin  81 mg Oral Daily  . clopidogrel  75 mg Oral Daily  . dexamethasone  2 mg Oral Daily  . DULoxetine  30 mg Oral Daily  .  enoxaparin (LOVENOX) injection  40 mg Subcutaneous Q24H  . famotidine  20 mg Oral Daily  . feeding supplement (ENSURE ENLIVE)  237 mL Oral BID BM  . fentaNYL  1 patch Transdermal Q72H  . furosemide  40 mg Intravenous BID  . insulin aspart  0-9 Units Subcutaneous TID WC  . insulin glargine  3 Units Subcutaneous Daily  . Ipratropium-Albuterol  1 puff Inhalation Q6H  . lisinopril  10 mg Oral Daily  . metoprolol succinate  50 mg Oral Daily  . morphine  30 mg Oral Q12H  . pantoprazole  40 mg Oral BID  . polyethylene glycol  17 g Oral Daily  . potassium chloride  40 mEq Oral Daily  . senna-docusate  2 tablet Oral QHS  . sodium chloride flush  3 mL Intravenous Q12H  . sucralfate  1 g Oral TID AC & HS  . zinc sulfate  220 mg Oral Daily    Continuous Infusions: . sodium chloride       LOS: 8 days     Annita Brod, MD [facility]  To reach me or the doctor on call, go to: [email or web address] Password [email or web address]  01/07/2019, 5:07 PM

## 2019-01-07 NOTE — Progress Notes (Addendum)
Spoke with patients family member, Vyla Pint, and gave update on patients status. Also spoke with daughter, Bettina Gavia who voice concerns about patients living arrangements upon discharge. Would like to follow up with case management. Family would also like a follow up on Palliative consult.

## 2019-01-07 NOTE — Progress Notes (Signed)
MD made aware of critical lab value. K+ is 2.7.

## 2019-01-07 NOTE — Progress Notes (Signed)
Physical Therapy Treatment Patient Details Name: Emily Thomas MRN: [id number] DOB: 1960/02/13 Today's Date: 01/07/2019    History of Present Illness 59 y.o. female with medical history significant of chronic back pain due to degenerative disc disease, DM-2, HTN, history of remote pulmonary embolism no longer on anticoagulation, CAD s/p PCI approximately 1 year back-chronic epigastric pain-being worked up in the outpatient setting (EGD/colonoscopy scheduled)-presenting as a transfer from [place] for evaluation of acute hypoxic respiratory failure in the setting of COVID-19 pneumonia.    PT Comments    The patient is quite lethargic, did ambulate to BR with min assist for balance. Patient had incontinence of BM. Assisted to wash up. Patient plans return home.Marland Kitchen No DME needs at present.    Follow Up Recommendations  Home health PT(has been denied)     Equipment Recommendations  None recommended by PT    Recommendations for Other Services       Precautions / Restrictions Precautions Precautions: Fall Precaution Comments: especially after taking pain meds, some BM incontinence and unawares, patient's SPO2 88% on Ra after ambualted.    Mobility  Bed Mobility Overal bed mobility: Needs Assistance             General bed mobility comments: in recliner  Transfers Overall transfer level: Needs assistance Equipment used: None   Sit to Stand: Supervision Stand pivot transfers: Supervision       General transfer comment: Cues for safety, used rail in BR.  Ambulation/Gait Ambulation/Gait assistance: Min guard Gait Distance (Feet): 20 Feet(x 2) Assistive device: 1 person hand held assist   Gait velocity: decreased   General Gait Details: cruising along wall, rail in BR, door frame and  steady assist, patient somnolent from medication most likely   Stairs             Wheelchair Mobility    Modified Rankin (Stroke Patients Only)       Balance Overall  balance assessment: Needs assistance Sitting-balance support: No upper extremity supported;Feet supported Sitting balance-Leahy Scale: Fair     Standing balance support: During functional activity;No upper extremity supported Standing balance-Leahy Scale: Poor Standing balance comment: possibly due to medication                            Cognition Arousal/Alertness: Lethargic;Suspect due to medications Behavior During Therapy: Flat affect Overall Cognitive Status: No family/caregiver present to determine baseline cognitive functioning                                 General Comments: per RN, pt. calls for assistance to go to BR, patient reports that wshe gets up alone. RN aware, recommend pt. not be up withput assistance.      Exercises      General Comments        Pertinent Vitals/Pain Pain Score: 6  Pain Location: abdomen and back Pain Descriptors / Indicators: Constant;Discomfort;Grimacing Pain Intervention(s): Monitored during session;Premedicated before session;Repositioned    Home Living                      Prior Function            PT Goals (current goals can now be found in the care plan section) Progress towards PT goals: Progressing toward goals    Frequency    Min 3X/week      PT Plan  Current plan remains appropriate    Co-evaluation              AM-PAC PT "6 Clicks" Mobility   Outcome Measure  Help needed turning from your back to your side while in a flat bed without using bedrails?: None Help needed moving from lying on your back to sitting on the side of a flat bed without using bedrails?: None Help needed moving to and from a bed to a chair (including a wheelchair)?: A Little Help needed standing up from a chair using your arms (e.g., wheelchair or bedside chair)?: A Little Help needed to walk in hospital room?: A Little Help needed climbing 3-5 steps with a railing? : A Lot 6 Click Score: 19     End of Session   Activity Tolerance: Patient limited by fatigue;Patient limited by lethargy;Patient limited by pain Patient left: in chair;with call bell/phone within reach;with nursing/sitter in room Nurse Communication: Mobility status PT Visit Diagnosis: Other abnormalities of gait and mobility (R26.89);History of falling (Z91.81)     Time: 8466-5993 PT Time Calculation (min) (ACUTE ONLY): 30 min  Charges:  $Gait Training: 8-22 mins $Self Care/Home Management: [place] Pager [phone] Office [phone]    Claretha Cooper 01/07/2019, 5:52 PM

## 2019-01-08 DIAGNOSIS — C799 Secondary malignant neoplasm of unspecified site: Secondary | ICD-10-CM

## 2019-01-08 DIAGNOSIS — U071 COVID-19: Principal | ICD-10-CM

## 2019-01-08 DIAGNOSIS — Z7189 Other specified counseling: Secondary | ICD-10-CM

## 2019-01-08 DIAGNOSIS — J1282 Pneumonia due to coronavirus disease 2019: Secondary | ICD-10-CM

## 2019-01-08 DIAGNOSIS — Z515 Encounter for palliative care: Secondary | ICD-10-CM

## 2019-01-08 DIAGNOSIS — G893 Neoplasm related pain (acute) (chronic): Secondary | ICD-10-CM

## 2019-01-08 LAB — COMPREHENSIVE METABOLIC PANEL
ALT: 35 U/L (ref 0–44)
AST: 85 U/L — ABNORMAL HIGH (ref 15–41)
Albumin: 2.2 g/dL — ABNORMAL LOW (ref 3.5–5.0)
Alkaline Phosphatase: 278 U/L — ABNORMAL HIGH (ref 38–126)
Anion gap: 18 — ABNORMAL HIGH (ref 5–15)
BUN: 23 mg/dL — ABNORMAL HIGH (ref 6–20)
CO2: 33 mmol/L — ABNORMAL HIGH (ref 22–32)
Calcium: 7.8 mg/dL — ABNORMAL LOW (ref 8.9–10.3)
Chloride: 88 mmol/L — ABNORMAL LOW (ref 98–111)
Creatinine, Ser: 0.79 mg/dL (ref 0.44–1.00)
GFR calc Af Amer: >60 mL/min (ref >60)
GFR calc non Af Amer: >60 mL/min (ref >60)
Glucose, Bld: 105 mg/dL — ABNORMAL HIGH (ref 70–99)
Potassium: 2.8 mmol/L — ABNORMAL LOW (ref 3.5–5.1)
Sodium: 139 mmol/L (ref 135–145)
Total Bilirubin: 1.2 mg/dL (ref 0.3–1.2)
Total Protein: 5.6 g/dL — ABNORMAL LOW (ref 6.5–8.1)

## 2019-01-08 LAB — CBC WITH DIFFERENTIAL/PLATELET
Abs Immature Granulocytes: 0.66 10*3/uL — ABNORMAL HIGH (ref 0.00–0.07)
Basophils Absolute: 0.1 10*3/uL (ref 0.0–0.1)
Basophils Relative: 1 %
Eosinophils Absolute: 0 10*3/uL (ref 0.0–0.5)
Eosinophils Relative: 0 %
HCT: 35.7 % — ABNORMAL LOW (ref 36.0–46.0)
Hemoglobin: 10.9 g/dL — ABNORMAL LOW (ref 12.0–15.0)
Immature Granulocytes: 6 %
Lymphocytes Relative: 8 %
Lymphs Abs: 0.9 10*3/uL (ref 0.7–4.0)
MCH: 25.4 pg — ABNORMAL LOW (ref 26.0–34.0)
MCHC: 30.5 g/dL (ref 30.0–36.0)
MCV: 83.2 fL (ref 80.0–100.0)
Monocytes Absolute: 0.6 10*3/uL (ref 0.1–1.0)
Monocytes Relative: 5 %
Neutro Abs: 9.2 10*3/uL — ABNORMAL HIGH (ref 1.7–7.7)
Neutrophils Relative %: 80 %
Platelets: 198 10*3/uL (ref 150–400)
RBC: 4.29 MIL/uL (ref 3.87–5.11)
RDW: 16.3 % — ABNORMAL HIGH (ref 11.5–15.5)
WBC: 11.4 10*3/uL — ABNORMAL HIGH (ref 4.0–10.5)
nRBC: 0.4 % — ABNORMAL HIGH (ref 0.0–0.2)

## 2019-01-08 LAB — GLUCOSE, CAPILLARY
Glucose-Capillary: 117 mg/dL — ABNORMAL HIGH (ref 70–99)
Glucose-Capillary: 329 mg/dL — ABNORMAL HIGH (ref 70–99)
Glucose-Capillary: 157 mg/dL — ABNORMAL HIGH (ref 70–99)
Glucose-Capillary: 152 mg/dL — ABNORMAL HIGH (ref 70–99)

## 2019-01-08 LAB — MAGNESIUM: Magnesium: 1.9 mg/dL (ref 1.7–2.4)

## 2019-01-08 LAB — C-REACTIVE PROTEIN: CRP: 1.4 mg/dL — ABNORMAL HIGH (ref <1.0)

## 2019-01-08 LAB — LIPASE, BLOOD: Lipase: 89 U/L — ABNORMAL HIGH (ref 11–51)

## 2019-01-08 LAB — BRAIN NATRIURETIC PEPTIDE: B Natriuretic Peptide: 176.3 pg/mL — ABNORMAL HIGH (ref 0.0–100.0)

## 2019-01-08 LAB — D-DIMER, QUANTITATIVE: D-Dimer, Quant: 1.89 ug/mL-FEU — ABNORMAL HIGH (ref 0.00–0.50)

## 2019-01-08 LAB — PHOSPHORUS: Phosphorus: 2.4 mg/dL — ABNORMAL LOW (ref 2.5–4.6)

## 2019-01-08 LAB — FERRITIN: Ferritin: 184 ng/mL (ref 11–307)

## 2019-01-08 MED ORDER — POTASSIUM CHLORIDE 10 MEQ/100ML IV SOLN
10.0000 meq | INTRAVENOUS | Status: AC
  Administered 2019-01-08 (×6): 10 meq via INTRAVENOUS
  Filled 2019-01-08 (×6): qty 100

## 2019-01-08 MED ORDER — MAGNESIUM SULFATE 2 GM/50ML IV SOLN
2.0000 g | Freq: Once | INTRAVENOUS | Status: AC
  Administered 2019-01-08: 2 g via INTRAVENOUS
  Filled 2019-01-08: qty 50

## 2019-01-08 MED ORDER — MORPHINE SULFATE 15 MG PO TABS
15.0000 mg | ORAL_TABLET | Freq: Four times a day (QID) | ORAL | Status: DC | PRN
  Administered 2019-01-11 – 2019-01-16 (×16): 15 mg via ORAL
  Filled 2019-01-08 (×16): qty 1

## 2019-01-08 MED ORDER — DEXTROSE-NACL 5-0.9 % IV SOLN: INTRAVENOUS | Status: DC

## 2019-01-08 NOTE — Progress Notes (Signed)
Interventional Radiology Progress Note  59 yo female admitted to [facility] on 12/30/2018, after transfer from [facility] ED after diagnosis on 12/29/2018.    Imaging reveals metastatic lung cancer, without tissue diagnosis.    Discussed image guided biopsy with Dr. Sherral Hammers this morning.    Technically doable.    Palliative consult apparently scheduled for today.    Assuming that patient would like to proceed with treatment after meeting today, would suggest a transfer to [facility] hospital for oncology opinion and tissue biopsy in parallel.    Call with questions  Signed,  Dulcy Fanny. Earleen Newport, DO

## 2019-01-08 NOTE — Progress Notes (Signed)
PROGRESS NOTE    Emily Thomas  PFX:[id number] DOB: [birth year] DOA: 12/30/2018 PCP: Patient, No Pcp Per   Brief Narrative:  Emily Thomas is a 59 y.o. WF PMHx tobacco abuse, chronic back pain due to degenerative disc disease, diabetes type 2 uncontrolled with complication, HTN,CAD s/p PCI approximately 1 year back-chronic, Hx remote pulmonary embolism no longer on anticoagulation, epigastric pain-being worked up in the outpatient setting (EGD/colonoscopy scheduled), Hx gastric ulcer, Hx hiatal hernia  Presenting as a transfer from [place] for evaluation of acute hypoxic respiratory failure in the setting of COVID-19 pneumonia.  Per patient, for the past 1 month or so-she has had worsening chronic back pain-and epigastric pain.  She apparently recently saw her primary care/GI MD-and was prepped for colonoscopy/endoscopy-however due to poor bowel preparation-endoscopy evaluation could not be completed.  She claims that since December 15-after she completed a bowel prep for colonoscopy-she started feeling fatigue with generalized myalgias.  She then started developing shortness of breath that has gradually progressed over the past few days.  She presented to [facility] she had severe exertional dyspnea and could hardly walk a few feet.  At [place] emergency room-patient was found to have pneumonia-her COVID-19 swab was positive-and she was requiring around 5 L to maintain O2 saturations above 90.  Subsequently case was discussed with the prior hospitalist at [facility] patient was accepted as a transfer  Patient denies any fever, nausea, vomiting or diarrhea.   Subjective: 1/6 A/O x4, states would like to have biopsy and speak with oncology on what her are her palliative options.  Understands given the extent of the spread of her cancer cure very very unlikely.     Assessment & Plan:   Principal Problem:   Acute respiratory failure with  hypoxemia (HCC) Active Problems:   Pneumonia due to COVID-19 virus   HTN (hypertension)   Acute combined systolic and diastolic congestive heart failure (HCC)   CAD (coronary artery disease)   Essential hypertension   Diabetes mellitus type 2, uncontrolled, with complications (HCC)   Chronic epigastric pain   Tobacco abuse   Drug-seeking behavior   Acute pancreatitis   Metastatic cancer ([place])   Overweight (BMI 25.0-29.9)   Hypokalemia  Covid pneumonia/acute respiratory failure with hypoxia COVID-19 Labs  Recent Labs    01/06/19 0550 01/07/19 0620 01/08/19 0140  DDIMER 2.22* 3.00* 1.89*  FERRITIN 127 161 184  CRP 0.6 0.8 1.4*    No results found for: SARSCOV2NAA -Decadron 6 mg daily -Remdesivir per pharmacy protocol (last dose 1/1) -Combivent -Titrate O2 to maintain SPO2> 88% -Flutter valve -Incentive spirometer -Prone patient 16 hours/day; if patient cannot stand that length of time prone to 3 hours every shift  Chronic systolic and diastolic CHF -Unknown based weight-Strict ins and outs -7.1 L -Daily weight Filed Weights   01/06/19 0500 01/07/19 0500 01/08/19 0451  Weight: 78.2 kg 68.1 kg 66.6 kg  -12/30 echocardiogram; shows systolic and diastolic CHF see results below -Lasix IV 40 mg BID -Lisinopril 10 mg daily -Toprol 50 mg daily  Hx CAD s/p PCI -ASA 81 mg daily -Plavix. 75 mg daily  HTN -See CHF  Diabetes type 2 uncontrolled with complication* -32/99 hemoglobin A1c= 7.2 -Sensitive SSI -12/30 Lantus 5 units daily  Epigastric pain (chronic) -Hx gastric ulcer and hiatal hernia.:  -Abdomen currently soft, nontender palpation -12/31 DC Maalox--> GI cocktail PRN -Carafate 1 gTID -1/1see metastatic lung cancer and acute pancreatitis  Chronic back pain -Has been  on methadone and oxycodone in the past. -Due to acute pancreatitis DC all  PO pain medication.   -1/1 fentanyl patch 75 mcg/h -Morphine 2 mg q4 PRN -Toradol 15 mg PRN  Metastatic  right lower lobe lung cancer -Patient has large mass in liver, lung, kidney i.e. multiple places.  See CT -1/1 discussed case with Dr. Lamonte Sakai, and after completion of Covid treatment believes may be easier to obtain liver biopsy. -Consult IR for liver biopsy to confirm primary source of metastatic cancer. -1/1 Palliative Care consult placed -1/2 consult to IR placed for liver biopsy; diagnosis/staging of newly discovered metastatic cancer -1/2 head CT pending staging purposes -1/2 chest CT pending staging purposes -PTH-RP pending -ADH pending -ACTH pending -1/6 discussed case with Dr. Damita Dunnings, IR at [facility] and they are willing to perform liver biopsy if we will transfer patient.  Acute pancreatitis -Elevated lipase and amylase -N.p.o. except meds -D5-0.9% saline 56ml/hr -Trend lipase  Benzodiazepine abuse? -Staff has observed that patient arrived with a new bottle of lorazepam and 20 tablets have been used within 24 hours. -Monitor closely for withdrawal  Drug-seeking behavior -Be very judicious in use of benzodiazepine and narcotics -1/1 patient with metastatic cancer and acute pancreatitis.  Control pain  Remote history of VTE (pulmonary embolism): -Start prophylactic Lovenox-we will resume aspirin/Plavix. Encourage ambulation with PT.  Anxiety -Ativan PRN -12/31 Cymbalta 30 mg daily  History of tobacco abuse: -Smokes 2 packs/day stopped approximately 3 weeks ago when she became ill.    Hypokalemia -Potassium goal> 4 -Potassium IV 60 mEq -K-Dur 40 mEq daily  Hypophosphatemia -WNL  Hypomagnesmia -Magnesium goal> 2 -Magnesium 2 g    DVT prophylaxis: Lovenox Code Status: Full Family Communication: 01/03/2019 Spoke with Margaretmary Bayley (Daughter), and Juliann Pulse explained plan of care answered all questions [phone] Disposition Plan: Discharge 1/1   Consultants:   Dr. Damita Dunnings, IR    Procedures/Significant Events:  12/31 echocardiogram;Left  Ventricle: EF= 35 to 40%. The left ventricle has severely decreased function. The left ventricle demonstrates global hypokinesis.  -Left ventricular diastolic parameters are consistent with Grade I diastolic dysfunction 1/1 CT abdomen pelvis W/WO contrast;-findings consistent with widespread metastatic disease, likely secondary to a central right lower lobe lung mass, presumably obscured by postobstructive consolidation.  -Findings include marked thoracic nodal adenopathy, bilateral liver metastasis, and equivocal but suspicious osseous lesions. Possible upper abdominal nodal and left adrenal metastasis. Consider multidisciplinary thoracic oncology consultation for eventual sampling. - Concurrent right greater than left ground-glass opacities, likely related to COVID-19 pneumonia. -Small right pleural effusion. -Low-density lesion or lesions within the peripancreatic space and lesser sac, lymphangioma versus sequelae of prior pancreatitis. -CAD 1/2 CT chest;-occlusion of the right bronchus intermedius and dense, near-total postobstructive consolidation of the right middle and right lower lobes. Underlying mass is not distinctly appreciated in the background of consolidation however findings are most consistent primary lung malignancy.  Numerous bulky mediastinal and right hilar lymph nodes, consistent with metastatic disease. -Extensive, heterogeneous and ground-glass airspace opacity throughout the lungs bilaterally, consistent with reported COVID-19 infection. -Small right pleural effusion. -Underlying centrilobular and paraseptal emphysema at the lung apices. Emphysema  -Subpleural bronchiolectasis of the mid lungs (series 4, image 21), which may reflect prominent appearance of emphysema or alternately some degree of underlying fibrotic interstitial lung disease, difficult to evaluate due to extensive airspace disease and malignant consolidation of the right lung. -Bulky hepatic masses, better  appreciated by previous dedicated CT of the abdomen and pelvis and consistent with abdominal metastatic disease. -  Subtle sclerotic lesions within the thoracic spine, concerning for osseous metastatic disease. -CAD  1/2 CT head W. Wo contrast;-changes in the posterior left occipital lobe suspicious for metastatic disease. MRI with contrast is recommended for further evaluation.    I have personally reviewed and interpreted all radiology studies and my findings are as above.  VENTILATOR SETTINGS: Room air 1/6 SPO2; 90%   Cultures 12/28 HIV screen negative     Antimicrobials: Anti-infectives (From admission, onward)   Start     Dose/Rate Stop   01/03/19 0800  remdesivir 100 mg in sodium chloride 0.9 % 100 mL IVPB     100 mg 200 mL/hr over 30 Minutes 01/03/19 0910   12/31/18 1000  remdesivir 100 mg in sodium chloride 0.9 % 100 mL IVPB  Status:  Discontinued     100 mg 200 mL/hr over 30 Minutes 12/30/18 1432   12/31/18 1000  remdesivir 100 mg in sodium chloride 0.9 % 100 mL IVPB     100 mg 200 mL/hr over 30 Minutes 01/02/19 1208   12/30/18 1500  azithromycin (ZITHROMAX) 500 mg in sodium chloride 0.9 % 250 mL IVPB     500 mg 250 mL/hr over 60 Minutes 01/01/19 1900   12/30/18 1415  cefTRIAXone (ROCEPHIN) 1 g in sodium chloride 0.9 % 100 mL IVPB     1 g 200 mL/hr over 30 Minutes 01/03/19 1358   12/30/18 1400  cefTRIAXone (ROCEPHIN) injection 1 g  Status:  Discontinued     1 g 12/30/18 1400   12/30/18 1200  remdesivir 200 mg in sodium chloride 0.9% 250 mL IVPB     200 mg 580 mL/hr over 30 Minutes 12/30/18 1306       Devices    LINES / TUBES:     Continuous Infusions: . sodium chloride       Objective: Vitals:   01/07/19 1939 01/08/19 0430 01/08/19 0451 01/08/19 0720  BP: (!) 124/59 134/68  (!) (P) 146/79  Pulse: 68 79  (P) 64  Resp: 18 16  (P) 16  Temp: 98.3 F (36.8 C) 98 F (36.7 C)  (P) 97.9 F (36.6 C)  TempSrc: Oral Oral  (P) Oral  SpO2: 94% 92%  (P)  95%  Weight:   66.6 kg   Height:       No intake or output data in the 24 hours ending 01/08/19 0818 Filed Weights   01/06/19 0500 01/07/19 0500 01/08/19 0451  Weight: 78.2 kg 68.1 kg 66.6 kg   Physical Exam:  General: A/O x4, positive acute respiratory distress Eyes: negative scleral hemorrhage, negative anisocoria, negative icterus ENT: Negative Runny nose, negative gingival bleeding, Neck:  Negative scars, masses, torticollis, lymphadenopathy, JVD Lungs: Clear to auscultation bilaterally without wheezes or crackles Cardiovascular: Regular rate and rhythm without murmur gallop or rub normal S1 and S2 Abdomen: negative abdominal pain, mildly distended, positive soft, bowel sounds, no rebound, no ascites, no appreciable mass Extremities: No significant cyanosis, clubbing, or edema bilateral lower extremities Skin: Negative rashes, lesions, ulcers Psychiatric: Positive depression, positive anxiety, negative fatigue, negative mania  Central nervous system:  Cranial nerves II through XII intact, tongue/uvula midline, all extremities muscle strength 5/5, sensation intact throughout, negative dysarthria, negative expressive aphasia, negative receptive aphasia.   .     Data Reviewed: Care during the described time interval was provided by me .  I have reviewed this patient's available data, including medical history, events of note, physical examination, and all test results as part of  my evaluation.   CBC: Recent Labs  Lab 01/04/19 0910 01/05/19 0121 01/06/19 0550 01/07/19 0620 01/08/19 0140  WBC 8.7 9.6 11.8* 12.4* 11.4*  NEUTROABS 7.0 8.0* 9.7* 9.7* 9.2*  HGB 10.2* 10.8* 10.8* 11.2* 10.9*  HCT 34.6* 35.7* 35.0* 36.4 35.7*  MCV 85.4 84.4 83.9 83.7 83.2  PLT 236 245 227 212 751   Basic Metabolic Panel: Recent Labs  Lab 01/04/19 0910 01/05/19 0121 01/05/19 1418 01/06/19 0550 01/07/19 0620 01/08/19 0140  NA 140 138  --  136 137 PENDING  K 3.1* 2.7* 2.8* 3.2* 2.7*  PENDING  CL 97* 93*  --  92* 90* PENDING  CO2 32 33*  --  33* 33* PENDING  GLUCOSE 102* 136*  --  108* 68* 105*  BUN 23* 21*  --  20 18 23*  CREATININE 0.68 0.76  --  0.59 0.70 0.79  CALCIUM 7.8* 7.7*  --  8.0* 8.0* PENDING  MG 1.9 2.2  --  1.8 1.7 1.9  PHOS 2.7 3.5  --  1.5* 2.3* 2.4*   GFR: Estimated Creatinine Clearance: 67.7 mL/min (by C-G formula based on SCr of 0.79 mg/dL). Liver Function Tests: Recent Labs  Lab 01/04/19 0910 01/05/19 0121 01/06/19 0550 01/07/19 0620 01/08/19 0140  AST 56* 59* 53* 81* 85*  ALT 30 30 29  35 35  ALKPHOS 210* 222* 203* 267* 278*  BILITOT 0.4 0.5 0.7 0.6 1.2  PROT 5.4* 5.4* 5.4* 5.5* 5.6*  ALBUMIN 1.9* 1.9* 2.0* 2.1* 2.2*   Recent Labs  Lab 01/03/19 0809 01/05/19 0121 01/06/19 0550 01/07/19 0620 01/08/19 0140  LIPASE 81* 74* 97* 63* 89*  AMYLASE 471*  --   --   --   --    No results for input(s): AMMONIA in the last 168 hours. Coagulation Profile: No results for input(s): INR, PROTIME in the last 168 hours. Cardiac Enzymes: No results for input(s): CKTOTAL, CKMB, CKMBINDEX, TROPONINI in the last 168 hours. BNP (last 3 results) No results for input(s): PROBNP in the last 8760 hours. HbA1C: No results for input(s): HGBA1C in the last 72 hours. CBG: Recent Labs  Lab 01/07/19 0800 01/07/19 1147 01/07/19 1657 01/07/19 2057 01/08/19 0751  GLUCAP 78 141* 120* 134* 117*   Lipid Profile: No results for input(s): CHOL, HDL, LDLCALC, TRIG, CHOLHDL, LDLDIRECT in the last 72 hours. Thyroid Function Tests: No results for input(s): TSH, T4TOTAL, FREET4, T3FREE, THYROIDAB in the last 72 hours. Anemia Panel: Recent Labs    01/07/19 0620 01/08/19 0140  FERRITIN 161 184   Urine analysis: No results found for: COLORURINE, APPEARANCEUR, LABSPEC, PHURINE, GLUCOSEU, HGBUR, BILIRUBINUR, KETONESUR, PROTEINUR, UROBILINOGEN, NITRITE, LEUKOCYTESUR Sepsis Labs: @LABRCNTIP (procalcitonin:4,lacticidven:4)  )No results found for this or any  previous visit (from the past 240 hour(s)).       Radiology Studies: No results found.      Scheduled Meds: . vitamin C  500 mg Oral Daily  . aspirin  81 mg Oral Daily  . clopidogrel  75 mg Oral Daily  . dexamethasone  2 mg Oral Daily  . DULoxetine  30 mg Oral Daily  . enoxaparin (LOVENOX) injection  40 mg Subcutaneous Q24H  . famotidine  20 mg Oral Daily  . feeding supplement (ENSURE ENLIVE)  237 mL Oral BID BM  . furosemide  40 mg Intravenous BID  . insulin aspart  0-9 Units Subcutaneous TID WC  . insulin glargine  3 Units Subcutaneous Daily  . Ipratropium-Albuterol  1 puff Inhalation Q6H  . lisinopril  10  mg Oral Daily  . metoprolol succinate  50 mg Oral Daily  . morphine  30 mg Oral Q12H  . pantoprazole  40 mg Oral BID  . polyethylene glycol  17 g Oral Daily  . potassium chloride  40 mEq Oral Daily  . senna-docusate  2 tablet Oral QHS  . sodium chloride flush  3 mL Intravenous Q12H  . sucralfate  1 g Oral TID AC & HS  . zinc sulfate  220 mg Oral Daily   Continuous Infusions: . sodium chloride       LOS: 9 days   The patient is critically ill with multiple organ systems failure and requires high complexity decision making for assessment and support, frequent evaluation and titration of therapies, application of advanced monitoring technologies and extensive interpretation of multiple databases. Critical Care Time devoted to patient care services described in this note  Time spent: 40 minutes     Dalvin Clipper, Geraldo Docker, MD [facility] Pager [phone]  If 7PM-7AM, please contact night-coverage [email or web address] Password [facility] 01/08/2019, 8:18 AM

## 2019-01-08 NOTE — Plan of Care (Signed)
Patient Care Plan  Problem: Education: Goal: Knowledge of risk factors and measures for prevention of condition will improve Outcome: Progressing   Problem: Coping: Goal: Psychosocial and spiritual needs will be supported Outcome: Progressing   Problem: Respiratory: Goal: Will maintain a patent airway Outcome: Progressing Goal: Complications related to the disease process, condition or treatment will be avoided or minimized Outcome: Progressing

## 2019-01-08 NOTE — Plan of Care (Signed)
  Problem: Education: Goal: Knowledge of risk factors and measures for prevention of condition will improve Outcome: Progressing   Problem: Coping: Goal: Psychosocial and spiritual needs will be supported Outcome: Progressing   Problem: Respiratory: Goal: Will maintain a patent airway Outcome: Progressing Goal: Complications related to the disease process, condition or treatment will be avoided or minimized Outcome: Progressing   

## 2019-01-08 NOTE — Progress Notes (Signed)
Inpatient Diabetes Program Recommendations  AACE/ADA: New Consensus Statement on Inpatient Glycemic Control (2015)  Target Ranges:  Prepandial:   less than 140 mg/dL      Peak postprandial:   less than 180 mg/dL (1-2 hours)      Critically ill patients:  140 - 180 mg/dL   Lab Results  Component Value Date   GLUCAP 157 (H) 01/08/2019   HGBA1C 7.2 (H) 12/30/2018    Review of Glycemic Control  Diabetes history: DM2 Outpatient Diabetes medications: metformin 500 mg QD Current orders for Inpatient glycemic control: Lantus 3 units QD, Novolog 0-9 units tidwc  HgbA1C - 7.2% Ensure Enlive (45 g CHO) bid  Inpatient Diabetes Program Recommendations:     Consider adding Novolog 3 units tidwc for CHO coverage of supplement.  Continue to follow.  Thank you. Lorenda Peck, RD, LDN, CDE Inpatient Diabetes Coordinator [phone]

## 2019-01-08 NOTE — Progress Notes (Signed)
Patient has requested that I call her sister, Georgeanne Nim, with updates on her status.

## 2019-01-08 NOTE — Consult Note (Addendum)
Consultation Note Date: 01/07/18  Patient Name: Emily Thomas  DOB: Dec 17, 1960  MRN: [id number]  Age / Sex: 59 y.o., female  PCP: Patient, No Pcp Per Referring Physician: Allie Bossier, MD  Reason for Consultation: Establishing goals of care  HPI/Patient Profile: 59 y.o. female  with past medical history of degenerative disc disease, chronic back pain, DM type 2, HTN, PE no longer on anticoagulation, CAD, combined CHF admitted on 12/30/2018 with shortness of breath. Patient transferred from [facility] for Covid pneumonia and acute respiratory failure with hypoxia. Completed remdesivir and steroids are being weaned. Patient's course complicated by acute pancreatitis. ECHO 17/40 revealed systolic/diastolic heart failure with EF 35-40%. Abdominal CT revealed liver and lung mass, suspicious for RLL metastatic lung cancer and sclerotic lesions. CT head suspicious for metastases in left occipital lobe. As of 01/07/19, patient weaned off of oxygen. Palliative medicine consultation for goals of care. If patient wishes to proceed with treatment, will need transfer to [facility] for biopsy and oncology recommendations.   Clinical Assessment and Goals of Care:  I have reviewed medical records and discussed with Dr. Hilma Favors, Dr. Sherral Hammers, and RN.   GOC discussed via telephone with patient since COVID 19 positive. Selen is awake, alert, oriented and able to participate in discussion. Patient gives this NP permission to also discuss medical information with her daughter-in-law, Lars Mage.    Introduced Palliative Medicine as specialized medical care for people living with serious illness. It focuses on providing relief from the symptoms and stress of a serious illness. The goal is to improve quality of life for both the patient and the family.  We discussed a brief life review of the patient. Patient lives alone in a trailer. Her son  Georgianne Gritz) lives on the property and also her husband, Herbie Baltimore (DIL reports they are still legally married but separated). Lars Mage is married to the patient's son, Ysidro Evert. Lars Mage reports Khylie is very stubborn but loves her family and has been caring for grandchildren.  Discussed events leading up to admission and course of hospitalization including diagnoses, interventions, plan of care. Reviewed CT results with patient and DIL. Frankly and compassionately explained likelihood of metastatic cancer, poor prognosis, and oncology interventions being palliative NOT curative in nature.   The difference between aggressive medical intervention and comfort care/hospice services was discussed. Introduced hospice philosophy. Patient does wish to pursue biopsy and at least discuss options with oncology. She does seem to understand severity of this new diagnosis.   I attempted to elicit values and goals of care important to the patient. Advanced directives, concepts specific to code status, artifical feeding and hydration were discussed. Patient is interested in completing AD packet. She wishes for sister Tonita Cong) and son Syndey Jaskolski) to be documented HCPOA's. Attempted to explore her thoughts and wishes. Peighton shares that she plans to discuss wishes with sister, Juliann Pulse. Shenicka states "I don't want to die right now" and then changes the subject from potassium burning her arm.    Discussed her pain management.  Patient was previously followed by pain management specialist in [facility]. She has not seen specialist in many years (since approximately 2014) and reports that she has been off Methadone for years. 'Weaned myself off.' Explained that her chronic back pain is likely worse from sclerotic lesions. MS Contin was started by attending yesterday. Patient feels this is already helping her pain.   Reassured patient of ongoing support from palliative medicine. Explained plan for transfer to  [facility] tomorrow for biopsy and oncology workup.     After discussion with patient, spoke with DIL Alisha for 45 minutes. Lars Mage provides a detailed description of the patient's somewhat poor living conditions in trailer prior to admission. Lars Mage is concerned about her returning home alone to the trailer. Lars Mage seems to have a good understanding of diagnoses, plan of care, and poor prognosis. Alisha respects Hasset's decision to pursue biopsy and discuss with oncology, but speaks of not wanting the patient to suffer. "I want her to have quality" and "we don't want her to be in pain."   Lars Mage speaks of concerns with challenging family dynamics and if Juliann Pulse is POA, she would likely opt for full code and all aggressive measures to keep Trinaty alive. Encouraged ongoing family discussions about Tamakia's GOC and EOL wishes, especially now since she is awake, alert, and competent to make medical decisions and express her wishes to family.   Answered many questions for [facility] regarding aggressive medical management versus comfort focused care and hospice philosophy/options. Explained if there are any oncology options, these would be palliative NOT curative in nature. Also discussed her underlying heart failure. Alisha understands.   Reassured of ongoing support from PMT this admission and recommended outpatient palliative services on discharge. PMT contact information given.    SUMMARY OF RECOMMENDATIONS    Continue FULL code/FULL scope treatment. Patient wishes to pursue biopsy and discuss options with oncology. Explained oncology options being palliative NOT curative in nature, with probable widespread metastatic cancer.   Spiritual care consult. Patient wishes to complete AD packet. Requests sister Tonita Cong) and son Carmina Walle) to be HCPOA's.   Symptom management--see below.  Likely will need TOC team involved with disposition plan. DIL concerned about patient returning home alone with  current living conditions.   Outpatient palliative referral. Patient needs ongoing [place] discussions pending biopsy and discussions with oncology.   Code Status/Advance Care Planning:  Full code  Symptom Management:   MS Contin 30mg  12 hour tablet  MSIR 15mg  PO q6h prn moderate pain  Morphine 2mg  IV q4h prn severe pain  Ativan 1mg  PO TID prn   Miralax daily  Senna 2 tab HS daily  Dulcolax daily prn  Palliative Prophylaxis:   Bowel Regimen, Delirium Protocol and Frequent Pain Assessment  Psycho-social/Spiritual:   Desire for further Chaplaincy support:yes  Additional Recommendations: Caregiving  Support/Resources, Compassionate Wean Education and Education on Hospice  Prognosis:   Likely poor prognosis with probable metastatic lung cancer--new diagnosis incidentally found on CT.  Discharge Planning: To Be Determined      Primary Diagnoses: Present on Admission: . Acute respiratory failure with hypoxemia ([place]) . Pneumonia due to COVID-19 virus . HTN (hypertension) . Acute combined systolic and diastolic congestive heart failure ([place]) . CAD (coronary artery disease) . Essential hypertension . Diabetes mellitus type 2, uncontrolled, with complications ([place]) . Chronic epigastric pain . Tobacco abuse . Acute pancreatitis . Metastatic cancer ([place]) . Overweight (BMI 25.0-29.9) . Hypokalemia   I have reviewed the medical record, interviewed the  patient and family, and examined the patient. The following aspects are pertinent.  No past medical history on file. Social History   Socioeconomic History  . Marital status: Married    Spouse name: Not on file  . Number of children: Not on file  . Years of education: Not on file  . Highest education level: Not on file  Occupational History  . Not on file  Tobacco Use  . Smoking status: Former Research scientist (life sciences)  . Smokeless tobacco: Never Used  Substance and Sexual Activity  . Alcohol use: Not on file  . Drug use: Not on  file  . Sexual activity: Not on file  Other Topics Concern  . Not on file  Social History Narrative  . Not on file   Social Determinants of Health   Financial Resource Strain:   . Difficulty of Paying Living Expenses: Not on file  Food Insecurity:   . Worried About Charity fundraiser in the Last Year: Not on file  . Ran Out of Food in the Last Year: Not on file  Transportation Needs:   . Lack of Transportation (Medical): Not on file  . Lack of Transportation (Non-Medical): Not on file  Physical Activity:   . Days of Exercise per Week: Not on file  . Minutes of Exercise per Session: Not on file  Stress:   . Feeling of Stress : Not on file  Social Connections:   . Frequency of Communication with Friends and Family: Not on file  . Frequency of Social Gatherings with Friends and Family: Not on file  . Attends Religious Services: Not on file  . Active Member of Clubs or Organizations: Not on file  . Attends Archivist Meetings: Not on file  . Marital Status: Not on file   No family history on file. Scheduled Meds: . vitamin C  500 mg Oral Daily  . aspirin  81 mg Oral Daily  . clopidogrel  75 mg Oral Daily  . dexamethasone  2 mg Oral Daily  . DULoxetine  30 mg Oral Daily  . enoxaparin (LOVENOX) injection  40 mg Subcutaneous Q24H  . famotidine  20 mg Oral Daily  . feeding supplement (ENSURE ENLIVE)  237 mL Oral BID BM  . furosemide  40 mg Intravenous BID  . insulin aspart  0-9 Units Subcutaneous TID WC  . insulin glargine  3 Units Subcutaneous Daily  . Ipratropium-Albuterol  1 puff Inhalation Q6H  . lisinopril  10 mg Oral Daily  . metoprolol succinate  50 mg Oral Daily  . morphine  30 mg Oral Q12H  . pantoprazole  40 mg Oral BID  . polyethylene glycol  17 g Oral Daily  . potassium chloride  40 mEq Oral Daily  . senna-docusate  2 tablet Oral QHS  . sodium chloride flush  3 mL Intravenous Q12H  . sucralfate  1 g Oral TID AC & HS  . zinc sulfate  220 mg Oral  Daily   Continuous Infusions: . sodium chloride    . dextrose 5 % and 0.9% NaCl    . potassium chloride 10 mEq (01/08/19 1627)   PRN Meds:.sodium chloride, acetaminophen, albuterol, alum & mag hydroxide-simeth **AND** lidocaine, antiseptic oral rinse, bisacodyl, chlorpheniramine-HYDROcodone, guaiFENesin-dextromethorphan, LORazepam, morphine injection, nitroGLYCERIN, ondansetron **OR** ondansetron (ZOFRAN) IV, sodium chloride, sodium chloride flush Medications Prior to Admission:  Prior to Admission medications   Medication Sig Start Date End Date Taking? Authorizing Provider  Aspirin-Salicylamide-Caffeine (BC HEADACHE POWDER PO) Take 1 Package  by mouth 3 (three) times daily as needed (headache or mild pain).   Yes [provider]  clopidogrel (PLAVIX) 75 MG tablet Take 75 mg by mouth every evening. 12/09/18  Yes [provider]  lisinopril (ZESTRIL) 10 MG tablet Take 10 mg by mouth daily. 10/21/18  Yes [provider]  LORazepam (ATIVAN) 1 MG tablet Take 1 mg by mouth 3 (three) times daily as needed for anxiety. 12/24/18  Yes [provider]  metFORMIN (GLUCOPHAGE) 500 MG tablet Take 500 mg by mouth daily with breakfast. 12/09/18  Yes [provider]  metoprolol succinate (TOPROL-XL) 50 MG 24 hr tablet Take 50 mg by mouth at bedtime. 12/20/18  Yes [provider]  NITROSTAT 0.4 MG SL tablet Place 0.4 mg under the tongue every 5 (five) minutes as needed for chest pain. 08/21/18  Yes [provider]  pantoprazole (PROTONIX) 40 MG tablet Take 40 mg by mouth 2 (two) times daily as needed for heartburn. 11/27/18  Yes [provider]  rosuvastatin (CRESTOR) 20 MG tablet Take 20 mg by mouth at bedtime. 11/11/18  Yes [provider]   Allergies  Allergen Reactions  . Tetracyclines & Related Rash    Many years ago   Review of Systems  Constitutional: Positive for activity change and fatigue.       Chronic back pain   Respiratory: Positive for shortness of breath.    Physical Exam  Vital Signs: BP 106/79 (BP Location: Left Arm)   Pulse 74   Temp 97.8 F (36.6 C) (Oral)   Resp 18   Ht 5' 1.97" (1.574 m)   Wt 66.6 kg   SpO2 90%   BMI 26.88 kg/m  Pain Scale: 0-10 POSS *See Group Information*: 2-Acceptable,Slightly drowsy, easily aroused Pain Score: 10-Worst pain ever   SpO2: SpO2: 90 % O2 Device:SpO2: 90 % O2 Flow Rate: .O2 Flow Rate (L/min): 2 L/min  IO: Intake/output summary:   Intake/Output Summary (Last 24 hours) at 01/08/2019 1636 Last data filed at 01/08/2019 1226 Gross per 24 hour  Intake 50 ml  Output -  Net 50 ml    LBM: Last BM Date: 01/08/19 Baseline Weight: Weight: 72 kg Most recent weight: Weight: 66.6 kg     Palliative Assessment/Data: PPS 50%   Flowsheet Rows     Most Recent Value  Intake Tab  Referral Department  Hospitalist  Unit at Time of Referral  Med/Surg Unit  Palliative Care Primary Diagnosis  Cancer  Palliative Care Type  New Palliative care  Reason for referral  Clarify Goals of Care  Date first seen by Palliative Care  01/08/19  Clinical Assessment  Palliative Performance Scale Score  50%  Psychosocial & Spiritual Assessment  Palliative Care Outcomes  Patient/Family meeting held?  Yes  Who was at the meeting?  patient, DIL  Palliative Care Outcomes  Clarified goals of care, Counseled regarding hospice, Provided end of life care assistance, Provided psychosocial or spiritual support, ACP counseling assistance, Linked to palliative care logitudinal support, Improved pain interventions, Improved non-pain symptom therapy      Time In/Out: 1020-1040, 4431-5400 Time Total: 110 min  Greater than 50%  of this time was spent counseling and coordinating care related to the above assessment and plan.  The above conversation was completed via telephone due to visitor restrictions during COVID-19 pandemic. Thorough chart review and discussion with  multidisciplinary team was completed as part of assessment. No physical examination was performed.   Signed by:  Ihor Dow,  DNP, FNP-C Palliative Medicine Team  Phone: [phone] Fax: [phone]   Please contact Palliative Medicine Team phone at [phone] for questions and concerns.  For individual provider: See Shea Evans

## 2019-01-08 NOTE — Progress Notes (Signed)
Pt A/O, no noted respiratory distress. Ambulated to the BR. slept well during the night. Continues to c/o chronic back pain. Administered prn pain regimen @ 0447.   Spoke with son, Kynlea Blackston he is expecting case management or social worker to find placement for pt d/t he is an OTR truckdriver. He notes no one in the family will be able to provide 24hr at home.   He expressed concerned of her living condition once she is release. He is requesting a case management or social worker contact Margaretmary Bayley [phone] r/t she is familiar with what is going on.

## 2019-01-08 NOTE — Progress Notes (Signed)
Ambulation Note  Saturation Pre: 90% on RA  Ambulation Distance: 40 ft  Saturation During Ambulation: 91% on 2L  Notes: Pt noticeably weak and lethargic. Used RW. Returned to bed with call bell and phone within reach on RA with O2 saturation at 93%. Will follow up with patient tomorrow.  Philis Kendall, MS, ACSM CEP 1:54 PM 01/08/2019

## 2019-01-08 NOTE — Progress Notes (Signed)
Palliative care consult received and chart reviewed in detail. Patient with probable metastatic lung cancer -new dx found incidentally on CT imaging during this admission. Palliative provider scheduled to see patient today 1/6 to discuss goals of care.  Lane Hacker, DO Palliative Medicine [phone]

## 2019-01-08 NOTE — Progress Notes (Signed)
PMT consult received and chart reviewed. Discussed GOC in detail with patient and with patient's permission, spoke with DIL Cassandria Santee). Discussed biopsy and oncology workup (options being palliative in nature, NOT curative) versus comfort focused care plan with initiation of hospice services. Patient wishes to pursue biopsy and speak with oncology about options. She is interested in completing AD packet with sister Juliann Pulse) and son Herbie Baltimore Dillion) as HCPOA's. Discussed symptom management and reassured of ongoing support from PMT with tough decisions in the future.   Updated DIL, Alisha on my conversation with [place].   Full palliative note to follow.   NO CHARGE  Ihor Dow, [place], FNP-C Palliative Medicine Team  Phone: [phone] Fax: [phone]

## 2019-01-08 NOTE — Progress Notes (Signed)
Spoke with patients sister, Sharyl Nimrod, and gave an update on patients status, POC, and transfer plan to [facility].

## 2019-01-09 DIAGNOSIS — Z7189 Other specified counseling: Secondary | ICD-10-CM

## 2019-01-09 DIAGNOSIS — G893 Neoplasm related pain (acute) (chronic): Secondary | ICD-10-CM

## 2019-01-09 DIAGNOSIS — Z515 Encounter for palliative care: Secondary | ICD-10-CM

## 2019-01-09 LAB — COMPREHENSIVE METABOLIC PANEL
ALT: 34 U/L (ref 0–44)
AST: 82 U/L — ABNORMAL HIGH (ref 15–41)
Albumin: 2 g/dL — ABNORMAL LOW (ref 3.5–5.0)
Alkaline Phosphatase: 272 U/L — ABNORMAL HIGH (ref 38–126)
Anion gap: 12 (ref 5–15)
BUN: 24 mg/dL — ABNORMAL HIGH (ref 6–20)
CO2: 34 mmol/L — ABNORMAL HIGH (ref 22–32)
Calcium: 7.9 mg/dL — ABNORMAL LOW (ref 8.9–10.3)
Chloride: 94 mmol/L — ABNORMAL LOW (ref 98–111)
Creatinine, Ser: 0.78 mg/dL (ref 0.44–1.00)
GFR calc Af Amer: >60 mL/min (ref >60)
GFR calc non Af Amer: >60 mL/min (ref >60)
Glucose, Bld: 118 mg/dL — ABNORMAL HIGH (ref 70–99)
Potassium: 3 mmol/L — ABNORMAL LOW (ref 3.5–5.1)
Sodium: 140 mmol/L (ref 135–145)
Total Bilirubin: 0.4 mg/dL (ref 0.3–1.2)
Total Protein: 5.5 g/dL — ABNORMAL LOW (ref 6.5–8.1)

## 2019-01-09 LAB — APTT: aPTT: 32 seconds (ref 24–36)

## 2019-01-09 LAB — LIPASE, BLOOD: Lipase: 74 U/L — ABNORMAL HIGH (ref 11–51)

## 2019-01-09 LAB — PROTIME-INR
INR: 1 (ref 0.8–1.2)
Prothrombin Time: 13.6 seconds (ref 11.4–15.2)

## 2019-01-09 LAB — PHOSPHORUS: Phosphorus: 1.9 mg/dL — ABNORMAL LOW (ref 2.5–4.6)

## 2019-01-09 LAB — GLUCOSE, CAPILLARY
Glucose-Capillary: 154 mg/dL — ABNORMAL HIGH (ref 70–99)
Glucose-Capillary: 203 mg/dL — ABNORMAL HIGH (ref 70–99)
Glucose-Capillary: 185 mg/dL — ABNORMAL HIGH (ref 70–99)

## 2019-01-09 LAB — MAGNESIUM: Magnesium: 2.2 mg/dL (ref 1.7–2.4)

## 2019-01-09 MED ORDER — POTASSIUM PHOSPHATES 15 MMOLE/5ML IV SOLN
30.0000 mmol | Freq: Once | INTRAVENOUS | Status: AC
  Administered 2019-01-09: 30 mmol via INTRAVENOUS
  Filled 2019-01-09: qty 10

## 2019-01-09 NOTE — Progress Notes (Signed)
Physical Therapy Treatment Patient Details Name: Emily Thomas MRN: [id number] DOB: 06/15/1960 Today's Date: 01/09/2019    History of Present Illness 59 y.o. female  with PMH including degenerative disc disease, chronic back pain, DM type 2, HTN, PE no longer on anticoagulation, CAD, combined CHF admitted on 12/30/2018 with shortness of breath. Patient transferred from [facility] for Covid pneumonia and acute respiratory failure with hypoxia. Patient's course complicated by acute pancreatitis. ECHO 50/53 revealed systolic/diastolic heart failure with EF 35-40%. Abdominal CT revealed liver and lung mass, suspicious for RLL metastatic lung cancer and sclerotic lesions. CT head suspicious for metastases in left occipital lobe.     PT Comments     The patient reports pain in abdomen today. Has ambulated  This AM and agin this PM. May require home O2. Continue progressive ambulation and monitor of SPO2.  Follow Up Recommendations  Home health PT     Equipment Recommendations    none   Recommendations for Other Services       Precautions / Restrictions Precautions Precautions: Fall Precaution Comments: especially after taking pain meds, some BM incontinence and unawares, patient's SPO2 89% on Ra after ambulated Restrictions Weight Bearing Restrictions: No    Mobility  Bed Mobility Overal bed mobility: Needs Assistance Bed Mobility: Supine to Sit;Sit to Supine     Supine to sit: Supervision;HOB elevated Sit to supine: Supervision;HOB elevated      Transfers Overall transfer level: Needs assistance Equipment used: None Transfers: Sit to/from Stand Sit to Stand: Supervision         General transfer comment: cues for safety  Ambulation/Gait Ambulation/Gait assistance: Min guard Gait Distance (Feet): 40 Feet Assistive device: 1 person hand held assist Gait Pattern/deviations: Step-through pattern Gait velocity: decreased   General Gait Details: patient relied less  on UE support.   Stairs             Wheelchair Mobility    Modified Rankin (Stroke Patients Only)       Balance Overall balance assessment: Needs assistance Sitting-balance support: No upper extremity supported;Feet supported Sitting balance-Leahy Scale: Good Sitting balance - Comments: limited by pain in abdomen   Standing balance support: During functional activity;No upper extremity supported Standing balance-Leahy Scale: Fair                              Cognition Arousal/Alertness: Awake/alert Behavior During Therapy: Flat affect Overall Cognitive Status: No family/caregiver present to determine baseline cognitive functioning                                        Exercises      General Comments General comments (skin integrity, edema, etc.): Pt on 3L O2 throughout session      Pertinent Vitals/Pain Pain Assessment: 0-10 Pain Score: 7  Pain Location: abdomen and back Pain Descriptors / Indicators: Constant;Discomfort;Grimacing Pain Intervention(s): Monitored during session;Patient requesting pain meds-RN notified    Home Living                      Prior Function            PT Goals (current goals can now be found in the care plan section) Acute Rehab PT Goals Patient Stated Goal: to relieve pain in abdomen Progress towards PT goals: Progressing toward goals    Frequency  Min 3X/week      PT Plan Current plan remains appropriate    Co-evaluation              AM-PAC PT "6 Clicks" Mobility   Outcome Measure  Help needed turning from your back to your side while in a flat bed without using bedrails?: None Help needed moving from lying on your back to sitting on the side of a flat bed without using bedrails?: None Help needed moving to and from a bed to a chair (including a wheelchair)?: A Little Help needed standing up from a chair using your arms (e.g., wheelchair or bedside chair)?: A  Little Help needed to walk in hospital room?: A Little Help needed climbing 3-5 steps with a railing? : A Lot 6 Click Score: 19    End of Session   Activity Tolerance: Patient limited by fatigue;Patient limited by lethargy;Patient limited by pain Patient left: in bed;with call bell/phone within reach;with nursing/sitter in room Nurse Communication: Mobility status PT Visit Diagnosis: Other abnormalities of gait and mobility (R26.89);History of falling (Z91.81)     Time: 1550-1605 PT Time Calculation (min) (ACUTE ONLY): 15 min  Charges:  $Gait Training: 8-22 mins                     [place] Pager [phone] Office [phone]    Claretha Cooper 01/09/2019, 5:40 PM

## 2019-01-09 NOTE — Plan of Care (Signed)
  Problem: Respiratory: Goal: Will maintain a patent airway Outcome: Progressing Goal: Complications related to the disease process, condition or treatment will be avoided or minimized Outcome: Progressing   

## 2019-01-09 NOTE — Progress Notes (Signed)
PROGRESS NOTE    Emily Thomas  YHC:[id number] DOB: 1960-07-15 DOA: 12/30/2018 PCP: Patient, No Pcp Per   Brief Narrative:  Emily Thomas is a 59 y.o. WF PMHx tobacco abuse, chronic back pain due to degenerative disc disease, diabetes type 2 uncontrolled with complication, HTN,CAD s/p PCI approximately 1 year back-chronic, Hx remote pulmonary embolism no longer on anticoagulation, epigastric pain-being worked up in the outpatient setting (EGD/colonoscopy scheduled), Hx gastric ulcer, Hx hiatal hernia  Presenting as a transfer from [place] for evaluation of acute hypoxic respiratory failure in the setting of COVID-19 pneumonia.  Per patient, for the past 1 month or so-she has had worsening chronic back pain-and epigastric pain.  She apparently recently saw her primary care/GI MD-and was prepped for colonoscopy/endoscopy-however due to poor bowel preparation-endoscopy evaluation could not be completed.  She claims that since December 15-after she completed a bowel prep for colonoscopy-she started feeling fatigue with generalized myalgias.  She then started developing shortness of breath that has gradually progressed over the past few days.  She presented to [facility] she had severe exertional dyspnea and could hardly walk a few feet.  At [place] emergency room-patient was found to have pneumonia-her COVID-19 swab was positive-and she was requiring around 5 L to maintain O2 saturations above 90.  Subsequently case was discussed with the prior hospitalist at [facility] patient was accepted as a transfer  Patient denies any fever, nausea, vomiting or diarrhea.   Subjective: 1/7 patient was transferred to [facility] on 1/6 for liver biopsy by IR      Assessment & Plan:   Principal Problem:   Acute respiratory failure with hypoxemia ([place]) Active Problems:   Pneumonia due to COVID-19 virus   HTN (hypertension)   Acute combined systolic  and diastolic congestive heart failure (HCC)   CAD (coronary artery disease)   Essential hypertension   Diabetes mellitus type 2, uncontrolled, with complications (HCC)   Chronic epigastric pain   Tobacco abuse   Drug-seeking behavior   Acute pancreatitis   Metastatic cancer ([place])   Overweight (BMI 25.0-29.9)   Hypokalemia   Palliative care by specialist   Goals of care, counseling/discussion   Cancer related pain  Covid pneumonia/acute respiratory failure with hypoxia COVID-19 Labs  Recent Labs    01/07/19 0620 01/08/19 0140  DDIMER 3.00* 1.89*  FERRITIN 161 184  CRP 0.8 1.4*    No results found for: SARSCOV2NAA -Decadron 6 mg daily -Remdesivir per pharmacy protocol (last dose 1/1) -Combivent -Titrate O2 to maintain SPO2> 88% -Flutter valve -Incentive spirometer -Prone patient 16 hours/day; if patient cannot stand that length of time prone to 3 hours every shift  Chronic systolic and diastolic CHF -Unknown based weight-Strict ins and outs -7.1 L -Daily weight Filed Weights   01/06/19 0500 01/07/19 0500 01/08/19 0451  Weight: 78.2 kg 68.1 kg 66.6 kg  -12/30 echocardiogram; shows systolic and diastolic CHF see results below -Lasix IV 40 mg BID -Lisinopril 10 mg daily -Toprol 50 mg daily  Hx CAD s/p PCI -ASA 81 mg daily -Plavix. 75 mg daily  HTN -See CHF  Diabetes type 2 uncontrolled with complication* -51/76 hemoglobin A1c= 7.2 -Sensitive SSI -12/30 Lantus 5 units daily  Epigastric pain (chronic) -Hx gastric ulcer and hiatal hernia.:  -Abdomen currently soft, nontender palpation -12/31 DC Maalox--> GI cocktail PRN -Carafate 1 gTID -1/1see metastatic lung cancer and acute pancreatitis  Chronic back pain -Has been on methadone and oxycodone in the past. -Due  to acute pancreatitis DC all  PO pain medication.   -1/1 fentanyl patch 75 mcg/h -Morphine 2 mg q4 PRN -Toradol 15 mg PRN  Metastatic right lower lobe lung cancer -Patient has large mass  in liver, lung, kidney i.e. multiple places.  See CT -1/1 discussed case with Dr. Lamonte Sakai, and after completion of Covid treatment believes may be easier to obtain liver biopsy. -Consult IR for liver biopsy to confirm primary source of metastatic cancer. -1/1 Palliative Care consult placed -1/2 consult to IR placed for liver biopsy; diagnosis/staging of newly discovered metastatic cancer -1/2 head CT pending staging purposes -1/2 chest CT pending staging purposes -PTH-RP pending -ADH pending -ACTH pending -1/6 discussed case with Dr. Damita Dunnings, IR at [facility] and they are willing to perform liver biopsy if we will transfer patient.  Acute pancreatitis -Elevated lipase and amylase -N.p.o. except meds -D5-0.9% saline 28ml/hr -Trend lipase  Benzodiazepine abuse? -Staff has observed that patient arrived with a new bottle of lorazepam and 20 tablets have been used within 24 hours. -Monitor closely for withdrawal  Drug-seeking behavior -Be very judicious in use of benzodiazepine and narcotics -1/1 patient with metastatic cancer and acute pancreatitis.  Control pain  Remote history of VTE (pulmonary embolism): -Start prophylactic Lovenox-we will resume aspirin/Plavix. Encourage ambulation with PT.  Anxiety -Ativan PRN -12/31 Cymbalta 30 mg daily  History of tobacco abuse: -Smokes 2 packs/day stopped approximately 3 weeks ago when she became ill.    Hypokalemia -Potassium goal> 4 -Potassium phosphate 30 mmol  Hypophosphatemia -See hypokalemia  Hypomagnesmia -Magnesium goal> 2 -Magnesium 2 g    DVT prophylaxis: Lovenox Code Status: Full Family Communication: 01/03/2019 Spoke with Margaretmary Bayley (Daughter), and Juliann Pulse explained plan of care answered all questions [phone] Disposition Plan: Discharge 1/1   Consultants:   Dr. Damita Dunnings, IR    Procedures/Significant Events:  12/31 echocardiogram;Left Ventricle: EF= 35 to 40%. The left ventricle has severely  decreased function. The left ventricle demonstrates global hypokinesis.  -Left ventricular diastolic parameters are consistent with Grade I diastolic dysfunction 1/1 CT abdomen pelvis W/WO contrast;-findings consistent with widespread metastatic disease, likely secondary to a central right lower lobe lung mass, presumably obscured by postobstructive consolidation.  -Findings include marked thoracic nodal adenopathy, bilateral liver metastasis, and equivocal but suspicious osseous lesions. Possible upper abdominal nodal and left adrenal metastasis. Consider multidisciplinary thoracic oncology consultation for eventual sampling. - Concurrent right greater than left ground-glass opacities, likely related to COVID-19 pneumonia. -Small right pleural effusion. -Low-density lesion or lesions within the peripancreatic space and lesser sac, lymphangioma versus sequelae of prior pancreatitis. -CAD 1/2 CT chest;-occlusion of the right bronchus intermedius and dense, near-total postobstructive consolidation of the right middle and right lower lobes. Underlying mass is not distinctly appreciated in the background of consolidation however findings are most consistent primary lung malignancy.  Numerous bulky mediastinal and right hilar lymph nodes, consistent with metastatic disease. -Extensive, heterogeneous and ground-glass airspace opacity throughout the lungs bilaterally, consistent with reported COVID-19 infection. -Small right pleural effusion. -Underlying centrilobular and paraseptal emphysema at the lung apices. Emphysema  -Subpleural bronchiolectasis of the mid lungs (series 4, image 21), which may reflect prominent appearance of emphysema or alternately some degree of underlying fibrotic interstitial lung disease, difficult to evaluate due to extensive airspace disease and malignant consolidation of the right lung. -Bulky hepatic masses, better appreciated by previous dedicated CT of the abdomen and  pelvis and consistent with abdominal metastatic disease. -Subtle sclerotic lesions within the thoracic spine, concerning for osseous metastatic  disease. -CAD  1/2 CT head W. Wo contrast;-changes in the posterior left occipital lobe suspicious for metastatic disease. MRI with contrast is recommended for further evaluation.    I have personally reviewed and interpreted all radiology studies and my findings are as above.  VENTILATOR SETTINGS: Nasal cannula 1/7 Flow; 3 L/min SPO2; 93%   Cultures 12/28 HIV screen negative     Antimicrobials: Anti-infectives (From admission, onward)   Start     Dose/Rate Stop   01/03/19 0800  remdesivir 100 mg in sodium chloride 0.9 % 100 mL IVPB     100 mg 200 mL/hr over 30 Minutes 01/03/19 0910   12/31/18 1000  remdesivir 100 mg in sodium chloride 0.9 % 100 mL IVPB  Status:  Discontinued     100 mg 200 mL/hr over 30 Minutes 12/30/18 1432   12/31/18 1000  remdesivir 100 mg in sodium chloride 0.9 % 100 mL IVPB     100 mg 200 mL/hr over 30 Minutes 01/02/19 1208   12/30/18 1500  azithromycin (ZITHROMAX) 500 mg in sodium chloride 0.9 % 250 mL IVPB     500 mg 250 mL/hr over 60 Minutes 01/01/19 1900   12/30/18 1415  cefTRIAXone (ROCEPHIN) 1 g in sodium chloride 0.9 % 100 mL IVPB     1 g 200 mL/hr over 30 Minutes 01/03/19 1358   12/30/18 1400  cefTRIAXone (ROCEPHIN) injection 1 g  Status:  Discontinued     1 g 12/30/18 1400   12/30/18 1200  remdesivir 200 mg in sodium chloride 0.9% 250 mL IVPB     200 mg 580 mL/hr over 30 Minutes 12/30/18 1306       Devices    LINES / TUBES:     Continuous Infusions: . sodium chloride    . dextrose 5 % and 0.9% NaCl 50 mL/hr at 01/08/19 1700     Objective: Vitals:   01/08/19 1953 01/08/19 2300 01/09/19 0324 01/09/19 0735  BP: 125/69  131/71 (!) 144/83  Pulse: 68  65   Resp: 18  16 16   Temp: 97.7 F (36.5 C)  97.6 F (36.4 C) 97.6 F (36.4 C)  TempSrc: Oral  Oral Oral  SpO2: 91% 90% 92% 93%   Weight:      Height:        Intake/Output Summary (Last 24 hours) at 01/09/2019 1947 Last data filed at 01/08/2019 1953 Gross per 24 hour  Intake 708.38 ml  Output 0 ml  Net 708.38 ml   Filed Weights   01/06/19 0500 01/07/19 0500 01/08/19 0451  Weight: 78.2 kg 68.1 kg 66.6 kg   Physical Exam:  General: A/O x4, positive acute respiratory distress Eyes: negative scleral hemorrhage, negative anisocoria, negative icterus ENT: Negative Runny nose, negative gingival bleeding, Neck:  Negative scars, masses, torticollis, lymphadenopathy, JVD Lungs: Clear to auscultation bilaterally without wheezes or crackles Cardiovascular: Regular rate and rhythm without murmur gallop or rub normal S1 and S2 Abdomen: negative abdominal pain, mildly distended, positive soft, bowel sounds, no rebound, no ascites, no appreciable mass Extremities: No significant cyanosis, clubbing, or edema bilateral lower extremities Skin: Negative rashes, lesions, ulcers Psychiatric: Positive depression, positive anxiety, negative fatigue, negative mania  Central nervous system:  Cranial nerves II through XII intact, tongue/uvula midline, all extremities muscle strength 5/5, sensation intact throughout, negative dysarthria, negative expressive aphasia, negative receptive aphasia.   .     Data Reviewed: Care during the described time interval was provided by me .  I have reviewed this  patient's available data, including medical history, events of note, physical examination, and all test results as part of my evaluation.   CBC: Recent Labs  Lab 01/04/19 0910 01/05/19 0121 01/06/19 0550 01/07/19 0620 01/08/19 0140  WBC 8.7 9.6 11.8* 12.4* 11.4*  NEUTROABS 7.0 8.0* 9.7* 9.7* 9.2*  HGB 10.2* 10.8* 10.8* 11.2* 10.9*  HCT 34.6* 35.7* 35.0* 36.4 35.7*  MCV 85.4 84.4 83.9 83.7 83.2  PLT 236 245 227 212 720   Basic Metabolic Panel: Recent Labs  Lab 01/05/19 0121 01/05/19 1418 01/06/19 0550 01/07/19 0620  01/08/19 0140 01/09/19 0320 01/09/19 0500  NA 138  --  136 137 139  --  140  K 2.7* 2.8* 3.2* 2.7* 2.8*  --  3.0*  CL 93*  --  92* 90* 88*  --  94*  CO2 33*  --  33* 33* 33*  --  34*  GLUCOSE 136*  --  108* 68* 105*  --  118*  BUN 21*  --  20 18 23*  --  24*  CREATININE 0.76  --  0.59 0.70 0.79  --  0.78  CALCIUM 7.7*  --  8.0* 8.0* 7.8*  --  7.9*  MG 2.2  --  1.8 1.7 1.9 2.2  --   PHOS 3.5  --  1.5* 2.3* 2.4* 1.9*  --    GFR: Estimated Creatinine Clearance: 67.7 mL/min (by C-G formula based on SCr of 0.78 mg/dL). Liver Function Tests: Recent Labs  Lab 01/05/19 0121 01/06/19 0550 01/07/19 0620 01/08/19 0140 01/09/19 0500  AST 59* 53* 81* 85* 82*  ALT 30 29 35 35 34  ALKPHOS 222* 203* 267* 278* 272*  BILITOT 0.5 0.7 0.6 1.2 0.4  PROT 5.4* 5.4* 5.5* 5.6* 5.5*  ALBUMIN 1.9* 2.0* 2.1* 2.2* 2.0*   Recent Labs  Lab 01/03/19 0809 01/05/19 0121 01/06/19 0550 01/07/19 0620 01/08/19 0140 01/09/19 0320  LIPASE 81* 74* 97* 63* 89* 74*  AMYLASE 471*  --   --   --   --   --    No results for input(s): AMMONIA in the last 168 hours. Coagulation Profile: Recent Labs  Lab 01/09/19 0500  INR 1.0   Cardiac Enzymes: No results for input(s): CKTOTAL, CKMB, CKMBINDEX, TROPONINI in the last 168 hours. BNP (last 3 results) No results for input(s): PROBNP in the last 8760 hours. HbA1C: No results for input(s): HGBA1C in the last 72 hours. CBG: Recent Labs  Lab 01/08/19 1607 01/08/19 2142 01/09/19 0756 01/09/19 1304 01/09/19 1606  GLUCAP 157* 152* 154* 203* 185*   Lipid Profile: No results for input(s): CHOL, HDL, LDLCALC, TRIG, CHOLHDL, LDLDIRECT in the last 72 hours. Thyroid Function Tests: No results for input(s): TSH, T4TOTAL, FREET4, T3FREE, THYROIDAB in the last 72 hours. Anemia Panel: Recent Labs    01/07/19 0620 01/08/19 0140  FERRITIN 161 184   Urine analysis: No results found for: COLORURINE, APPEARANCEUR, LABSPEC, PHURINE, GLUCOSEU, HGBUR, BILIRUBINUR,  KETONESUR, PROTEINUR, UROBILINOGEN, NITRITE, LEUKOCYTESUR Sepsis Labs: @LABRCNTIP (procalcitonin:4,lacticidven:4)  )No results found for this or any previous visit (from the past 240 hour(s)).       Radiology Studies: No results found.      Scheduled Meds: . vitamin C  500 mg Oral Daily  . aspirin  81 mg Oral Daily  . clopidogrel  75 mg Oral Daily  . dexamethasone  2 mg Oral Daily  . DULoxetine  30 mg Oral Daily  . enoxaparin (LOVENOX) injection  40 mg Subcutaneous Q24H  . famotidine  20 mg Oral Daily  . feeding supplement (ENSURE ENLIVE)  237 mL Oral BID BM  . furosemide  40 mg Intravenous BID  . insulin aspart  0-9 Units Subcutaneous TID WC  . insulin glargine  3 Units Subcutaneous Daily  . Ipratropium-Albuterol  1 puff Inhalation Q6H  . lisinopril  10 mg Oral Daily  . metoprolol succinate  50 mg Oral Daily  . morphine  30 mg Oral Q12H  . pantoprazole  40 mg Oral BID  . polyethylene glycol  17 g Oral Daily  . potassium chloride  40 mEq Oral Daily  . senna-docusate  2 tablet Oral QHS  . sodium chloride flush  3 mL Intravenous Q12H  . sucralfate  1 g Oral TID AC & HS  . zinc sulfate  220 mg Oral Daily   Continuous Infusions: . sodium chloride    . dextrose 5 % and 0.9% NaCl 50 mL/hr at 01/08/19 1700     LOS: 10 days   The patient is critically ill with multiple organ systems failure and requires high complexity decision making for assessment and support, frequent evaluation and titration of therapies, application of advanced monitoring technologies and extensive interpretation of multiple databases. Critical Care Time devoted to patient care services described in this note  Time spent: 40 minutes     Evian Derringer, Geraldo Docker, MD [facility] Pager [phone]  If 7PM-7AM, please contact night-coverage [email or web address] Password [facility] 01/09/2019, 7:47 PM

## 2019-01-09 NOTE — Progress Notes (Signed)
PMT provider chart review. Received update from RN. Patient's pain has been managed with MS Contin and one dose of IV Morphine this morning. She is resting this afternoon. Worked with therapy. Attempted to call patient's room x2. No answer. PMT provider will check in with patient and family tomorrow, 1/8.  NO CHARGE  Ihor Dow, Isabella, FNP-C Palliative Medicine Team  Phone: [phone] Fax: [phone]

## 2019-01-09 NOTE — Progress Notes (Signed)
Occupational Therapy Treatment Patient Details Name: Emily Thomas MRN: [id number] DOB: January 17, 1960 Today's Date: 01/09/2019    History of present illness 59 y.o. female  with PMH including degenerative disc disease, chronic back pain, DM type 2, HTN, PE no longer on anticoagulation, CAD, combined CHF admitted on 12/30/2018 with shortness of breath. Patient transferred from [facility] for Covid pneumonia and acute respiratory failure with hypoxia. Patient's course complicated by acute pancreatitis. ECHO 09/73 revealed systolic/diastolic heart failure with EF 35-40%. Abdominal CT revealed liver and lung mass, suspicious for RLL metastatic lung cancer and sclerotic lesions. CT head suspicious for metastases in left occipital lobe.    OT comments  Pt progressing towards OT goals this session, Ambulation into bathroom for toileting and peri care at min guard, and then able to participate in sink level grooming. Reports recent ambulation with mobility tech. Current POC remains appropriate. Continue to see acutely as much as possible as it is unlikely that she will get HHOT through insurance.    Follow Up Recommendations  Supervision - Intermittent;Home health OT    Equipment Recommendations  3 in 1 bedside commode    Recommendations for Other Services      Precautions / Restrictions Precautions Precautions: Fall Restrictions Weight Bearing Restrictions: No       Mobility Bed Mobility Overal bed mobility: Needs Assistance Bed Mobility: Supine to Sit;Sit to Supine     Supine to sit: Supervision;HOB elevated Sit to supine: Supervision;HOB elevated      Transfers Overall transfer level: Needs assistance Equipment used: None Transfers: Sit to/from Stand Sit to Stand: Supervision         General transfer comment: Cues for safety, used rail in BR.    Balance Overall balance assessment: Needs assistance Sitting-balance support: No upper extremity supported;Feet  supported Sitting balance-Leahy Scale: Fair Sitting balance - Comments: limited by pain in abdomen   Standing balance support: During functional activity;No upper extremity supported Standing balance-Leahy Scale: Fair                             ADL either performed or assessed with clinical judgement   ADL Overall ADL's : Needs assistance/impaired     Grooming: Min guard;Standing;Wash/dry hands Grooming Details (indicate cue type and reason): sink level                 Toilet Transfer: Regular Toilet;Ambulation;Grab bars;Min guard   Toileting- Clothing Manipulation and Hygiene: Set up;Supervision/safety;Sit to/from stand       Functional mobility during ADLs: Min guard General ADL Comments: Pt able to ambulate to/from bathroom with min guard and without use of an assistive device. Noted 0 instances of loss of balance     Vision       Perception     Praxis      Cognition Arousal/Alertness: Awake/alert Behavior During Therapy: Flat affect(sad) Overall Cognitive Status: No family/caregiver present to determine baseline cognitive functioning                                          Exercises     Shoulder Instructions       General Comments Pt on 3L O2 throughout session    Pertinent Vitals/ Pain       Pain Assessment: 0-10 Pain Score: 6  Pain Location: abdomen and back Pain Descriptors / Indicators:  Constant;Discomfort;Grimacing Pain Intervention(s): Monitored during session;Repositioned  Home Living                                          Prior Functioning/Environment              Frequency  Min 3X/week        Progress Toward Goals  OT Goals(current goals can now be found in the care plan section)  Progress towards OT goals: Progressing toward goals  Acute Rehab OT Goals Patient Stated Goal: to relieve pain in abdomen Time For Goal Achievement: 01/15/19 Potential to Achieve Goals: Good   Plan Discharge plan remains appropriate    Co-evaluation                 AM-PAC OT "6 Clicks" Daily Activity     Outcome Measure   Help from another person eating meals?: None Help from another person taking care of personal grooming?: A Little Help from another person toileting, which includes using toliet, bedpan, or urinal?: A Little Help from another person bathing (including washing, rinsing, drying)?: A Little Help from another person to put on and taking off regular upper body clothing?: A Little Help from another person to put on and taking off regular lower body clothing?: A Little 6 Click Score: 19    End of Session Equipment Utilized During Treatment: Oxygen(3L)  OT Visit Diagnosis: Unsteadiness on feet (R26.81);Muscle weakness (generalized) (M62.81);Pain Pain - part of body: (abdomen)   Activity Tolerance Patient tolerated treatment well   Patient Left in bed;with call bell/phone within reach   Nurse Communication Mobility status(urination and BM)        Time: 1155-2080 OT Time Calculation (min): 23 min  Charges: OT General Charges $OT Visit: 1 Visit OT Treatments $Self Care/Home Management : 23-37 mins  [place] Pager: [phone] Office: [phone]   Mickel Baas 01/09/2019, 3:51 PM

## 2019-01-09 NOTE — Progress Notes (Signed)
Ambulation Note  Saturation Pre: 94% on 3L  Ambulation Distance: 80 ft  Saturation During Ambulation: 91% on 3L  Notes: Pt used RW. Pt has much more energy than yesterday and tolerated much better.   Philis Kendall, MS, ACSM CEP 10:11 AM 01/09/2019

## 2019-01-09 NOTE — Progress Notes (Addendum)
Chaplain referred via [place] re:  [place] - Patient requests sister Tonita Cong) to be HCPOA. Secondary is son Herbie [place] Dillion Colton).  Unfortunately, we are currently not able to notarize HCPOA at [facility] due to document's requirements for 2 witnesses that are not pt's family or pt's healthcare providers.    From RN Calwell's notes, it appears pt is planning transfer to [facility].  We may be able to facilitate notarization at [facility], if we are able to obtain witnesses who are willing to observe via ipad connection.    This chaplain will follow up about this need following transfer.       Jerene Pitch, MDiv, [facility] Clinical Chaplain [facility]

## 2019-01-10 LAB — COMPREHENSIVE METABOLIC PANEL
ALT: 41 U/L (ref 0–44)
AST: 78 U/L — ABNORMAL HIGH (ref 15–41)
Albumin: 2.1 g/dL — ABNORMAL LOW (ref 3.5–5.0)
Alkaline Phosphatase: 266 U/L — ABNORMAL HIGH (ref 38–126)
Anion gap: 14 (ref 5–15)
BUN: 22 mg/dL — ABNORMAL HIGH (ref 6–20)
CO2: 33 mmol/L — ABNORMAL HIGH (ref 22–32)
Calcium: 7.9 mg/dL — ABNORMAL LOW (ref 8.9–10.3)
Chloride: 94 mmol/L — ABNORMAL LOW (ref 98–111)
Creatinine, Ser: 0.7 mg/dL (ref 0.44–1.00)
GFR calc Af Amer: >60 mL/min (ref >60)
GFR calc non Af Amer: >60 mL/min (ref >60)
Glucose, Bld: 164 mg/dL — ABNORMAL HIGH (ref 70–99)
Potassium: 3.1 mmol/L — ABNORMAL LOW (ref 3.5–5.1)
Sodium: 141 mmol/L (ref 135–145)
Total Bilirubin: 0.8 mg/dL (ref 0.3–1.2)
Total Protein: 5.7 g/dL — ABNORMAL LOW (ref 6.5–8.1)

## 2019-01-10 LAB — CBC WITH DIFFERENTIAL/PLATELET
Abs Immature Granulocytes: 0.77 10*3/uL — ABNORMAL HIGH (ref 0.00–0.07)
Basophils Absolute: 0.1 10*3/uL (ref 0.0–0.1)
Basophils Relative: 1 %
Eosinophils Absolute: 0.7 10*3/uL — ABNORMAL HIGH (ref 0.0–0.5)
Eosinophils Relative: 6 %
HCT: 33.8 % — ABNORMAL LOW (ref 36.0–46.0)
Hemoglobin: 10 g/dL — ABNORMAL LOW (ref 12.0–15.0)
Immature Granulocytes: 7 %
Lymphocytes Relative: 9 %
Lymphs Abs: 1.1 10*3/uL (ref 0.7–4.0)
MCH: 25.2 pg — ABNORMAL LOW (ref 26.0–34.0)
MCHC: 29.6 g/dL — ABNORMAL LOW (ref 30.0–36.0)
MCV: 85.1 fL (ref 80.0–100.0)
Monocytes Absolute: 0.6 10*3/uL (ref 0.1–1.0)
Monocytes Relative: 5 %
Neutro Abs: 8.1 10*3/uL — ABNORMAL HIGH (ref 1.7–7.7)
Neutrophils Relative %: 72 %
Platelets: 158 10*3/uL (ref 150–400)
RBC: 3.97 MIL/uL (ref 3.87–5.11)
RDW: 16.6 % — ABNORMAL HIGH (ref 11.5–15.5)
WBC: 11.4 10*3/uL — ABNORMAL HIGH (ref 4.0–10.5)
nRBC: 0.7 % — ABNORMAL HIGH (ref 0.0–0.2)

## 2019-01-10 LAB — GLUCOSE, CAPILLARY
Glucose-Capillary: 157 mg/dL — ABNORMAL HIGH (ref 70–99)
Glucose-Capillary: 168 mg/dL — ABNORMAL HIGH (ref 70–99)
Glucose-Capillary: 206 mg/dL — ABNORMAL HIGH (ref 70–99)
Glucose-Capillary: 169 mg/dL — ABNORMAL HIGH (ref 70–99)
Glucose-Capillary: 179 mg/dL — ABNORMAL HIGH (ref 70–99)

## 2019-01-10 LAB — MAGNESIUM: Magnesium: 1.8 mg/dL (ref 1.7–2.4)

## 2019-01-10 LAB — PHOSPHORUS: Phosphorus: 2.4 mg/dL — ABNORMAL LOW (ref 2.5–4.6)

## 2019-01-10 MED ORDER — FENTANYL 100 MCG/HR TD PT72
1.0000 | MEDICATED_PATCH | TRANSDERMAL | Status: DC
  Administered 2019-01-10 – 2019-01-16 (×3): 1 via TRANSDERMAL
  Filled 2019-01-10 (×3): qty 1

## 2019-01-10 MED ORDER — INSULIN ASPART 100 UNIT/ML ~~LOC~~ SOLN
0.0000 [IU] | SUBCUTANEOUS | Status: DC
  Administered 2019-01-11: 8 [IU] via SUBCUTANEOUS
  Administered 2019-01-11 (×2): 2 [IU] via SUBCUTANEOUS
  Administered 2019-01-12: 13:00:00 5 [IU] via SUBCUTANEOUS
  Administered 2019-01-12: 3 [IU] via SUBCUTANEOUS
  Administered 2019-01-12: 2 [IU] via SUBCUTANEOUS
  Administered 2019-01-13: 17:00:00 8 [IU] via SUBCUTANEOUS
  Administered 2019-01-13: 5 [IU] via SUBCUTANEOUS
  Administered 2019-01-13: 3 [IU] via SUBCUTANEOUS
  Administered 2019-01-14: 5 [IU] via SUBCUTANEOUS
  Administered 2019-01-14: 3 [IU] via SUBCUTANEOUS
  Administered 2019-01-15: 21:00:00 5 [IU] via SUBCUTANEOUS
  Administered 2019-01-15 (×2): 3 [IU] via SUBCUTANEOUS
  Administered 2019-01-16: 8 [IU] via SUBCUTANEOUS
  Administered 2019-01-17 (×2): 2 [IU] via SUBCUTANEOUS
  Administered 2019-01-17: 5 [IU] via SUBCUTANEOUS
  Administered 2019-01-18: 3 [IU] via SUBCUTANEOUS
  Administered 2019-01-18: 2 [IU] via SUBCUTANEOUS
  Administered 2019-01-18: 3 [IU] via SUBCUTANEOUS

## 2019-01-10 MED ORDER — INSULIN GLARGINE 100 UNIT/ML ~~LOC~~ SOLN
5.0000 [IU] | Freq: Every day | SUBCUTANEOUS | Status: DC
  Administered 2019-01-11 – 2019-01-18 (×8): 5 [IU] via SUBCUTANEOUS
  Filled 2019-01-10 (×8): qty 0.05

## 2019-01-10 NOTE — Progress Notes (Addendum)
PROGRESS NOTE    Emily Thomas  FBP:[id number] DOB: 03/25/1960 DOA: 12/30/2018 PCP: Patient, No Pcp Per   Brief Narrative:  Emily Thomas is a 59 y.o. WF PMHx tobacco abuse, chronic back pain due to degenerative disc disease, diabetes type 2 uncontrolled with complication, HTN,CAD s/p PCI approximately 1 year back-chronic, Hx remote pulmonary embolism no longer on anticoagulation, epigastric pain-being worked up in the outpatient setting (EGD/colonoscopy scheduled), Hx gastric ulcer, Hx hiatal hernia  Presenting as a transfer from [place] for evaluation of acute hypoxic respiratory failure in the setting of COVID-19 pneumonia.  Per patient, for the past 1 month or so-she has had worsening chronic back pain-and epigastric pain.  She apparently recently saw her primary care/GI MD-and was prepped for colonoscopy/endoscopy-however due to poor bowel preparation-endoscopy evaluation could not be completed.  She claims that since December 15-after she completed a bowel prep for colonoscopy-she started feeling fatigue with generalized myalgias.  She then started developing shortness of breath that has gradually progressed over the past few days.  She presented to [facility] she had severe exertional dyspnea and could hardly walk a few feet.  At [place] emergency room-patient was found to have pneumonia-her COVID-19 swab was positive-and she was requiring around 5 L to maintain O2 saturations above 90.  Subsequently case was discussed with the prior hospitalist at [facility] patient was accepted as a transfer  Patient denies any fever, nausea, vomiting or diarrhea.   Subjective: 1/8 patient was transferred to [facility] on 1/6 for liver biopsy by IR.  As of today patient finally has a bed.      Assessment & Plan:   Principal Problem:   Acute respiratory failure with hypoxemia (HCC) Active Problems:   Pneumonia due to COVID-19 virus   HTN  (hypertension)   Acute combined systolic and diastolic congestive heart failure (HCC)   CAD (coronary artery disease)   Essential hypertension   Diabetes mellitus type 2, uncontrolled, with complications (HCC)   Chronic epigastric pain   Tobacco abuse   Drug-seeking behavior   Acute pancreatitis   Metastatic cancer ([place])   Overweight (BMI 25.0-29.9)   Hypokalemia   Palliative care by specialist   Goals of care, counseling/discussion   Cancer related pain  Covid pneumonia/acute respiratory failure with hypoxia COVID-19 Labs  Recent Labs    01/08/19 0140  DDIMER 1.89*  FERRITIN 184  CRP 1.4*    No results found for: SARSCOV2NAA -Decadron 6 mg daily -Remdesivir per pharmacy protocol (last dose 1/1) -Combivent -Titrate O2 to maintain SPO2> 88% -Flutter valve -Incentive spirometer -Prone patient 16 hours/day; if patient cannot stand that length of time prone to 3 hours every shift  Chronic systolic and diastolic CHF -Unknown based weight-Strict ins and outs -1.5 L.  Most likely inaccurate patient previously -7 L -Daily weight Filed Weights   01/08/19 0451 01/09/19 2000 01/10/19 0500  Weight: 66.6 kg 60 kg 60 kg  -12/30 echocardiogram; shows systolic and diastolic CHF see results below -Lasix IV 40 mg BID -Lisinopril 10 mg daily -Toprol 50 mg daily  Hx CAD s/p PCI -ASA 81 mg daily -Plavix. 75 mg daily  HTN -See CHF  Diabetes type 2 uncontrolled with complication* -82/42 hemoglobin A1c= 7.2 -1/8 Lantus 5 units daily -1/8 moderate SSI  Epigastric pain (chronic) -Hx gastric ulcer and hiatal hernia.:  -Abdomen currently soft, nontender palpation -12/31 DC Maalox--> GI cocktail PRN -Carafate 1 gTID -1/1see metastatic lung cancer and acute pancreatitis  Chronic back pain -Has been on methadone and oxycodone in the past. -Due to acute pancreatitis DC all  PO pain medication.   -1/1 fentanyl patch 75 mcg/h -Morphine 2 mg q4 PRN -Toradol 15 mg  PRN  Metastatic right lower lobe lung cancer -Patient has large mass in liver, lung, kidney i.e. multiple places.  See CT -1/1 discussed case with Dr. Lamonte Sakai, and after completion of Covid treatment believes may be easier to obtain liver biopsy. -Consult IR for liver biopsy to confirm primary source of metastatic cancer. -1/1 Palliative Care consult placed -1/2 consult to IR placed for liver biopsy; diagnosis/staging of newly discovered metastatic cancer -1/2 head CT pending staging purposes -1/2 chest CT pending staging purposes -PTH-RP pending -ADH pending -ACTH pending -1/6 discussed case with Dr. Damita Dunnings, IR at [facility] and they are willing to perform liver biopsy if we will transfer patient. -1/8 patient just received bed to [facility] long upon arrival contact IR and let them know she has finally arrived.  Acute pancreatitis -Elevated lipase and amylase -N.p.o. except meds -D5-0.9% saline 77ml/hr -Trend lipase  Benzodiazepine abuse? -Staff has observed that patient arrived with a new bottle of lorazepam and 20 tablets have been used within 24 hours. -Monitor closely for withdrawal  Drug-seeking behavior -Be very judicious in use of benzodiazepine and narcotics -1/1 patient with metastatic cancer and acute pancreatitis.  Control pain  Remote history of VTE (pulmonary embolism): -Start prophylactic Lovenox-we will resume aspirin/Plavix. Encourage ambulation with PT.  Anxiety -Ativan 1 mg TID -12/31 Cymbalta 30 mg daily  Pain control -1/8 DC p.o. morphine given extent of metastatic disease in abdomen cannot be sure of absorption as most likely disease extends into intestinal wall. -1/8 fentanyl patch 100 mcg/h.;  Titrate to effect -1/8 morphine 2 mg q 4 hr PRN   History of tobacco abuse: -Smokes 2 packs/day stopped approximately 3 weeks ago when she became ill.    Hypokalemia -Potassium goal> 4 -Potassium phosphate 30 mmol  Hypophosphatemia -See  hypokalemia  Hypomagnesmia -Magnesium goal> 2     DVT prophylaxis: Lovenox Code Status: Full Family Communication: 01/10/2019 Spoke with Margaretmary Bayley (Daughter), and Juliann Pulse explained plan of care answered all questions [phone] Disposition Plan: Discharge 1/1   Consultants:   Dr. Damita Dunnings, IR    Procedures/Significant Events:  12/31 echocardiogram;Left Ventricle: EF= 35 to 40%. The left ventricle has severely decreased function. The left ventricle demonstrates global hypokinesis.  -Left ventricular diastolic parameters are consistent with Grade I diastolic dysfunction 1/1 CT abdomen pelvis W/WO contrast;-findings consistent with widespread metastatic disease, likely secondary to a central right lower lobe lung mass, presumably obscured by postobstructive consolidation.  -Findings include marked thoracic nodal adenopathy, bilateral liver metastasis, and equivocal but suspicious osseous lesions. Possible upper abdominal nodal and left adrenal metastasis. Consider multidisciplinary thoracic oncology consultation for eventual sampling. - Concurrent right greater than left ground-glass opacities, likely related to COVID-19 pneumonia. -Small right pleural effusion. -Low-density lesion or lesions within the peripancreatic space and lesser sac, lymphangioma versus sequelae of prior pancreatitis. -CAD 1/2 CT chest;-occlusion of the right bronchus intermedius and dense, near-total postobstructive consolidation of the right middle and right lower lobes. Underlying mass is not distinctly appreciated in the background of consolidation however findings are most consistent primary lung malignancy.  Numerous bulky mediastinal and right hilar lymph nodes, consistent with metastatic disease. -Extensive, heterogeneous and ground-glass airspace opacity throughout the lungs bilaterally, consistent with reported COVID-19 infection. -Small right pleural effusion. -Underlying centrilobular and paraseptal  emphysema at  the lung apices. Emphysema  -Subpleural bronchiolectasis of the mid lungs (series 4, image 21), which may reflect prominent appearance of emphysema or alternately some degree of underlying fibrotic interstitial lung disease, difficult to evaluate due to extensive airspace disease and malignant consolidation of the right lung. -Bulky hepatic masses, better appreciated by previous dedicated CT of the abdomen and pelvis and consistent with abdominal metastatic disease. -Subtle sclerotic lesions within the thoracic spine, concerning for osseous metastatic disease. -CAD  1/2 CT head W. Wo contrast;-changes in the posterior left occipital lobe suspicious for metastatic disease. MRI with contrast is recommended for further evaluation.    I have personally reviewed and interpreted all radiology studies and my findings are as above.  VENTILATOR SETTINGS: Room air 1/8 SPO2; 90%   Cultures 12/28 HIV screen negative     Antimicrobials: Anti-infectives (From admission, onward)   Start     Dose/Rate Stop   01/03/19 0800  remdesivir 100 mg in sodium chloride 0.9 % 100 mL IVPB     100 mg 200 mL/hr over 30 Minutes 01/03/19 0910   12/31/18 1000  remdesivir 100 mg in sodium chloride 0.9 % 100 mL IVPB  Status:  Discontinued     100 mg 200 mL/hr over 30 Minutes 12/30/18 1432   12/31/18 1000  remdesivir 100 mg in sodium chloride 0.9 % 100 mL IVPB     100 mg 200 mL/hr over 30 Minutes 01/02/19 1208   12/30/18 1500  azithromycin (ZITHROMAX) 500 mg in sodium chloride 0.9 % 250 mL IVPB     500 mg 250 mL/hr over 60 Minutes 01/01/19 1900   12/30/18 1415  cefTRIAXone (ROCEPHIN) 1 g in sodium chloride 0.9 % 100 mL IVPB     1 g 200 mL/hr over 30 Minutes 01/03/19 1358   12/30/18 1400  cefTRIAXone (ROCEPHIN) injection 1 g  Status:  Discontinued     1 g 12/30/18 1400   12/30/18 1200  remdesivir 200 mg in sodium chloride 0.9% 250 mL IVPB     200 mg 580 mL/hr over 30 Minutes 12/30/18 1306        Devices    LINES / TUBES:     Continuous Infusions: . sodium chloride    . dextrose 5 % and 0.9% NaCl 50 mL/hr at 01/10/19 1232     Objective: Vitals:   01/10/19 0000 01/10/19 0400 01/10/19 0500 01/10/19 0801  BP:    (!) 149/80  Pulse:    72  Resp: 20 18  16   Temp:    98.1 F (36.7 C)  TempSrc:    Oral  SpO2:    90%  Weight:   60 kg   Height:        Intake/Output Summary (Last 24 hours) at 01/10/2019 1537 Last data filed at 01/10/2019 1430 Gross per 24 hour  Intake 2900.33 ml  Output 750 ml  Net 2150.33 ml   Filed Weights   01/08/19 0451 01/09/19 2000 01/10/19 0500  Weight: 66.6 kg 60 kg 60 kg   Physical Exam:  General: A/O x4, no acute respiratory distress Eyes: negative scleral hemorrhage, negative anisocoria, negative icterus ENT: Negative Runny nose, negative gingival bleeding, Neck:  Negative scars, masses, torticollis, lymphadenopathy, JVD Lungs: Clear to auscultation bilaterally without wheezes or crackles Cardiovascular: Regular rate and rhythm without murmur gallop or rub normal S1 and S2 Abdomen: negative abdominal pain, nondistended, positive soft, bowel sounds, no rebound, no ascites, no appreciable mass Extremities: No significant cyanosis, clubbing, or edema bilateral lower  extremities Skin: Negative rashes, lesions, ulcers Psychiatric: Positive depression,Positive anxiety, negative fatigue, negative mania  Central nervous system:  Cranial nerves II through XII intact, tongue/uvula midline, all extremities muscle strength 5/5, sensation intact throughout, negative dysarthria, negative expressive aphasia, negative receptive aphasia.   .     Data Reviewed: Care during the described time interval was provided by me .  I have reviewed this patient's available data, including medical history, events of note, physical examination, and all test results as part of my evaluation.   CBC: Recent Labs  Lab 01/05/19 0121 01/06/19 0550 01/07/19 0620  01/08/19 0140 01/10/19 0031  WBC 9.6 11.8* 12.4* 11.4* 11.4*  NEUTROABS 8.0* 9.7* 9.7* 9.2* 8.1*  HGB 10.8* 10.8* 11.2* 10.9* 10.0*  HCT 35.7* 35.0* 36.4 35.7* 33.8*  MCV 84.4 83.9 83.7 83.2 85.1  PLT 245 227 212 198 191   Basic Metabolic Panel: Recent Labs  Lab 01/06/19 0550 01/07/19 0620 01/08/19 0140 01/09/19 0320 01/09/19 0500 01/10/19 0031  NA 136 137 139  --  140 141  K 3.2* 2.7* 2.8*  --  3.0* 3.1*  CL 92* 90* 88*  --  94* 94*  CO2 33* 33* 33*  --  34* 33*  GLUCOSE 108* 68* 105*  --  118* 164*  BUN 20 18 23*  --  24* 22*  CREATININE 0.59 0.70 0.79  --  0.78 0.70  CALCIUM 8.0* 8.0* 7.8*  --  7.9* 7.9*  MG 1.8 1.7 1.9 2.2  --  1.8  PHOS 1.5* 2.3* 2.4* 1.9*  --  2.4*   GFR: Estimated Creatinine Clearance: 64.5 mL/min (by C-G formula based on SCr of 0.7 mg/dL). Liver Function Tests: Recent Labs  Lab 01/06/19 0550 01/07/19 0620 01/08/19 0140 01/09/19 0500 01/10/19 0031  AST 53* 81* 85* 82* 78*  ALT 29 35 35 34 41  ALKPHOS 203* 267* 278* 272* 266*  BILITOT 0.7 0.6 1.2 0.4 0.8  PROT 5.4* 5.5* 5.6* 5.5* 5.7*  ALBUMIN 2.0* 2.1* 2.2* 2.0* 2.1*   Recent Labs  Lab 01/05/19 0121 01/06/19 0550 01/07/19 0620 01/08/19 0140 01/09/19 0320  LIPASE 74* 97* 63* 89* 74*   No results for input(s): AMMONIA in the last 168 hours. Coagulation Profile: Recent Labs  Lab 01/09/19 0500  INR 1.0   Cardiac Enzymes: No results for input(s): CKTOTAL, CKMB, CKMBINDEX, TROPONINI in the last 168 hours. BNP (last 3 results) No results for input(s): PROBNP in the last 8760 hours. HbA1C: No results for input(s): HGBA1C in the last 72 hours. CBG: Recent Labs  Lab 01/09/19 1304 01/09/19 1606 01/09/19 2301 01/10/19 0759 01/10/19 1136  GLUCAP 203* 185* 157* 168* 206*   Lipid Profile: No results for input(s): CHOL, HDL, LDLCALC, TRIG, CHOLHDL, LDLDIRECT in the last 72 hours. Thyroid Function Tests: No results for input(s): TSH, T4TOTAL, FREET4, T3FREE, THYROIDAB in the  last 72 hours. Anemia Panel: Recent Labs    01/08/19 0140  FERRITIN 184   Urine analysis: No results found for: COLORURINE, APPEARANCEUR, LABSPEC, PHURINE, GLUCOSEU, HGBUR, BILIRUBINUR, KETONESUR, PROTEINUR, UROBILINOGEN, NITRITE, LEUKOCYTESUR Sepsis Labs: @LABRCNTIP (procalcitonin:4,lacticidven:4)  )No results found for this or any previous visit (from the past 240 hour(s)).       Radiology Studies: No results found.      Scheduled Meds: . vitamin C  500 mg Oral Daily  . aspirin  81 mg Oral Daily  . clopidogrel  75 mg Oral Daily  . dexamethasone  2 mg Oral Daily  . DULoxetine  30 mg Oral Daily  .  enoxaparin (LOVENOX) injection  40 mg Subcutaneous Q24H  . famotidine  20 mg Oral Daily  . feeding supplement (ENSURE ENLIVE)  237 mL Oral BID BM  . furosemide  40 mg Intravenous BID  . insulin aspart  0-9 Units Subcutaneous TID WC  . insulin glargine  3 Units Subcutaneous Daily  . Ipratropium-Albuterol  1 puff Inhalation Q6H  . lisinopril  10 mg Oral Daily  . metoprolol succinate  50 mg Oral Daily  . morphine  30 mg Oral Q12H  . pantoprazole  40 mg Oral BID  . polyethylene glycol  17 g Oral Daily  . potassium chloride  40 mEq Oral Daily  . senna-docusate  2 tablet Oral QHS  . sodium chloride flush  3 mL Intravenous Q12H  . sucralfate  1 g Oral TID AC & HS  . zinc sulfate  220 mg Oral Daily   Continuous Infusions: . sodium chloride    . dextrose 5 % and 0.9% NaCl 50 mL/hr at 01/10/19 1232     LOS: 11 days   The patient is critically ill with multiple organ systems failure and requires high complexity decision making for assessment and support, frequent evaluation and titration of therapies, application of advanced monitoring technologies and extensive interpretation of multiple databases. Critical Care Time devoted to patient care services described in this note  Time spent: 40 minutes     Takeyah Wieman, Geraldo Docker, MD [facility] Pager [phone]  If 7PM-7AM,  please contact night-coverage [email or web address] Password [email or web address] 01/10/2019, 3:37 PM

## 2019-01-10 NOTE — Plan of Care (Signed)
  Problem: Respiratory: Goal: Will maintain a patent airway Outcome: Progressing Goal: Complications related to the disease process, condition or treatment will be avoided or minimized Outcome: Progressing   

## 2019-01-10 NOTE — Plan of Care (Signed)
Rested well.  No pain or shortness of breath.  Good output to foley.  Problem: Education: Goal: Knowledge of risk factors and measures for prevention of condition will improve Outcome: Progressing   Problem: Coping: Goal: Psychosocial and spiritual needs will be supported Outcome: Progressing   Problem: Respiratory: Goal: Will maintain a patent airway Outcome: Progressing Goal: Complications related to the disease process, condition or treatment will be avoided or minimized Outcome: Progressing

## 2019-01-11 DIAGNOSIS — C3491 Malignant neoplasm of unspecified part of right bronchus or lung: Secondary | ICD-10-CM

## 2019-01-11 DIAGNOSIS — E118 Type 2 diabetes mellitus with unspecified complications: Secondary | ICD-10-CM

## 2019-01-11 DIAGNOSIS — Z720 Tobacco use: Secondary | ICD-10-CM

## 2019-01-11 DIAGNOSIS — E1165 Type 2 diabetes mellitus with hyperglycemia: Secondary | ICD-10-CM

## 2019-01-11 LAB — GLUCOSE, CAPILLARY
Glucose-Capillary: 129 mg/dL — ABNORMAL HIGH (ref 70–99)
Glucose-Capillary: 118 mg/dL — ABNORMAL HIGH (ref 70–99)
Glucose-Capillary: 276 mg/dL — ABNORMAL HIGH (ref 70–99)
Glucose-Capillary: 169 mg/dL — ABNORMAL HIGH (ref 70–99)
Glucose-Capillary: 132 mg/dL — ABNORMAL HIGH (ref 70–99)

## 2019-01-11 MED ORDER — SPIRONOLACTONE 12.5 MG HALF TABLET
12.5000 mg | ORAL_TABLET | Freq: Every day | ORAL | Status: DC
  Administered 2019-01-11: 12.5 mg via ORAL
  Filled 2019-01-11 (×2): qty 1

## 2019-01-11 MED ORDER — CLONAZEPAM 1 MG PO TABS
1.0000 mg | ORAL_TABLET | Freq: Two times a day (BID) | ORAL | Status: DC
  Administered 2019-01-11 – 2019-01-18 (×15): 1 mg via ORAL
  Filled 2019-01-11 (×15): qty 1

## 2019-01-11 MED ORDER — HEPARIN SODIUM (PORCINE) 5000 UNIT/ML IJ SOLN
5000.0000 [IU] | Freq: Three times a day (TID) | INTRAMUSCULAR | Status: DC
  Administered 2019-01-11 – 2019-01-12 (×3): 5000 [IU] via SUBCUTANEOUS
  Filled 2019-01-11 (×3): qty 1

## 2019-01-11 MED ORDER — BUSPIRONE HCL 5 MG PO TABS
5.0000 mg | ORAL_TABLET | Freq: Three times a day (TID) | ORAL | Status: DC
  Administered 2019-01-11 – 2019-01-18 (×22): 5 mg via ORAL
  Filled 2019-01-11 (×22): qty 1

## 2019-01-11 NOTE — Progress Notes (Signed)
PROGRESS NOTE  Emily Thomas WTU:[id number] DOB: 24-May-1960   PCP: Patient, No Pcp Per  Patient is from: Home.  DOA: 12/30/2018 LOS: 12  Brief Narrative / Interim history: 59 year old female with history of combined CHF, CAD, DM-2, tobacco use disorder, DDD/chronic back pain,  remote PE not on anticoagulation, gastric ulcer/GERD and hiatal hernia presented to [facility] with fatigue, myalgia and shortness of breath since he had bowel prep for EGD/colonoscopy mid December.  Diagnosed with acute respiratory failure with hypoxia due to COVID-19 pneumonia and transfer to [facility] for further care.  Upon further evaluation, found to have RLL lung cancer/mass concerning for metastasis to liver, intrathoracic lymph nodes, adrenal glands, thoracic spine and brain.  Case discussed with IR, Dr. Earleen Newport who was suggested transfer to [facility] for oncology input and possible liver biopsy if patient wishes to pursue treatment.  Patient completed 5 days of remdesivir course on 01/03/2019.   Subjective: No major events overnight of this morning.  Continues to complain generalized body pain and abdominal pain although she denies chest pain.  Reports improvement in her breathing.  She wishes to pursue further work-up and treatment for lung cancer.  She also wished to remain full code.   Objective: Vitals:   01/10/19 2000 01/11/19 0000 01/11/19 0251 01/11/19 1000  BP:   137/77 114/71  Pulse:   80 88  Resp: 20 20 18 18   Temp:   98 F (36.7 C) 98.1 F (36.7 C)  TempSrc:   Oral Oral  SpO2:   92% 93%  Weight:      Height:        Intake/Output Summary (Last 24 hours) at 01/11/2019 1319 Last data filed at 01/11/2019 0834 Gross per 24 hour  Intake 1770 ml  Output 1000 ml  Net 770 ml   Filed Weights   01/08/19 0451 01/09/19 2000 01/10/19 0500  Weight: 66.6 kg 60 kg 60 kg    Examination:  GENERAL: No apparent distress.  Nontoxic. HEENT: MMM.  Vision and hearing grossly intact.  NECK: Supple.  No apparent  JVD.  RESP:  No IWOB.  Fair aeration with rhonchi bilaterally. CVS:  RRR. Heart sounds normal.  ABD/GI/GU: Bowel sounds present. Soft.  Mild diffuse tenderness.  No rebound or guarding. MSK/EXT:  Moves extremities. No apparent deformity. No edema.  SKIN: no apparent skin lesion or wound NEURO: Awake, alert and oriented appropriately.  No apparent focal neuro deficit. PSYCH: Calm. Normal affect.   Procedures:  None  Assessment & Plan: Right lower lobe lung cancer/mass concerning for metastasis to liver, intrathoracic lymph nodes, adrenal glands, thoracic spine and brain.  -Per previous attending, pulmonary, Dr. Lamonte Sakai suggested liver biopsy.  IR, Dr. Earleen Newport suggested transfer to [facility] for this and oncology input. -Discussed with IR, Dr. Herschel Senegal for US-guided biopsy 1/11.  Lovenox to subcu heparin until biopsy. -Discussed with oncology, Dr. Hayden Rasmussen will see patient possibly on Monday after biopsy. -MRI brain with or without contrast-to evaluate soft tissue lesion noted on CT head.  Acute respiratory failure with hypoxia due to COVID-19 pneumonia: Improving.  95% on 2 L by [place]. -Completed 5 days of remdesivir on 01/03/2019 -On Decadron 2 mg daily-partly for potential brain mets. -Wean oxygen as able -Supportive care with inhalers, mucolytic's/antitussive and incentive spirometry. -OOB/PT/OT  Chronic combined CHF: Echo on 12/31 with LVEF of 35-40%, G1-DD and global hypokinesis but no other major finding.  Appears euvolemic on exam.  INR incomplete but had 1 L UOP overnight.  Weight downtrending.  No lab draws this morning. -Discontinue IV fluid -Continue IV Lasix 40 mg twice daily and K-Dur 40 daily. -GDMT-lisinopril and metoprolol.  Will add Aldactone.  -Monitor fluid status, renal function and electrolytes.  Mildly elevated lipase: No evidence of pancreatitis on imaging. -On p.o. and IV morphine and fentanyl patch for pain. -Recheck lipase in the morning  Uncontrolled DM-2 with  hyperglycemia: A1c 7.2% on 12/28. Recent Labs    01/11/19 0341 01/11/19 0733 01/11/19 1132  GLUCAP 129* 118* 276*  -Discontinue D5/half-normal saline. -Continue Lantus 5 units daily, SSI-moderate -Added Tradjenta and resume home Crestor. -CBG monitoring  COPD exacerbation: Likely due to COVID-19 infection. -Steroid and breathing treatments as above -Wean oxygen as able -Encourage smoking cessation.  History of CAD s/p PCI: Stable no anginal symptoms. -Lisinopril and metoprolol as above -Continue aspirin.  Hold Plavix pending liver biopsy. -Continue statin.  Essential hypertension: Normotensive. -Cardiac meds as above  DDD/chronic back pain -P.o./IV morphine, fentanyl patch, Lidoderm patch -Bowel regimen.  Remote history of PE: No longer on anticoagulation.  She is high risk for VTE given malignancy -Lovenox to subcu heparin for VTE prophylaxis pending biopsy.  Anxiety:  -Scheduled Klonopin 1 mg twice daily instead of as needed Ativan -Added BuSpar 5 mg 3 times daily. -Continue home Cymbalta.  Tobacco use disorder -Encourage cessation -Offer nicotine patch.  Hypokalemia/hypomagnesemia/hypophosphatemia -Replenish and recheck as appropriate -Waiting on labs from today.  History of PUD/GERD: -Continue Protonix and Carafate.  Goal of care discussion: Patient with significant cardiopulmonary comorbidities as above.  Now with right lung mass concerning for metastatic lung cancer to multiple organs.  Patient wishes to pursue biopsy and aggressive treatment.  She wishes to remain full code.  Palliative care involved. -Follow palliative recommendations     Nutrition Problem: Increased nutrient needs Etiology: catabolic WJXBJYN(WGNFA-21; new metastatic cancer)  Signs/Symptoms: estimated needs  Interventions: Ensure Enlive (each supplement provides 350kcal and 20 grams of protein), Magic cup, Hormel Shake   DVT prophylaxis: Subcu heparin Code Status: Full  code-confirmed with patient's. Family Communication: Updated patient's son, Herbie Baltimore and daughter-in-law, Elmo Putt over the phone. Disposition Plan: Remains inpatient Consultants: Pulm (off), IR, oncology   Microbiology summarized: -[id number] positive at [facility].  Sch Meds:  Scheduled Meds: . vitamin C  500 mg Oral Daily  . aspirin  81 mg Oral Daily  . clopidogrel  75 mg Oral Daily  . dexamethasone  2 mg Oral Daily  . DULoxetine  30 mg Oral Daily  . enoxaparin (LOVENOX) injection  40 mg Subcutaneous Q24H  . famotidine  20 mg Oral Daily  . feeding supplement (ENSURE ENLIVE)  237 mL Oral BID BM  . fentaNYL  1 patch Transdermal Q72H  . furosemide  40 mg Intravenous BID  . insulin aspart  0-15 Units Subcutaneous Q4H  . insulin glargine  5 Units Subcutaneous Daily  . Ipratropium-Albuterol  1 puff Inhalation Q6H  . lisinopril  10 mg Oral Daily  . metoprolol succinate  50 mg Oral Daily  . pantoprazole  40 mg Oral BID  . polyethylene glycol  17 g Oral Daily  . potassium chloride  40 mEq Oral Daily  . senna-docusate  2 tablet Oral QHS  . sodium chloride flush  3 mL Intravenous Q12H  . sucralfate  1 g Oral TID AC & HS  . zinc sulfate  220 mg Oral Daily   Continuous Infusions: . sodium chloride    . dextrose 5 % and 0.9% NaCl 50 mL/hr at 01/11/19 0426   PRN  Meds:.sodium chloride, acetaminophen, albuterol, alum & mag hydroxide-simeth **AND** lidocaine, antiseptic oral rinse, bisacodyl, chlorpheniramine-HYDROcodone, guaiFENesin-dextromethorphan, LORazepam, morphine, morphine injection, nitroGLYCERIN, ondansetron **OR** ondansetron (ZOFRAN) IV, sodium chloride, sodium chloride flush  Antimicrobials: Anti-infectives (From admission, onward)   Start     Dose/Rate Route Frequency Ordered Stop   01/03/19 0800  remdesivir 100 mg in sodium chloride 0.9 % 100 mL IVPB     100 mg 200 mL/hr over 30 Minutes Intravenous  Once 01/02/19 1737 01/03/19 0910   12/31/18 1000  remdesivir 100 mg in sodium  chloride 0.9 % 100 mL IVPB  Status:  Discontinued     100 mg 200 mL/hr over 30 Minutes Intravenous Daily 12/30/18 1049 12/30/18 1432   12/31/18 1000  remdesivir 100 mg in sodium chloride 0.9 % 100 mL IVPB     100 mg 200 mL/hr over 30 Minutes Intravenous Daily 12/30/18 1432 01/02/19 1208   12/30/18 1500  azithromycin (ZITHROMAX) 500 mg in sodium chloride 0.9 % 250 mL IVPB     500 mg 250 mL/hr over 60 Minutes Intravenous Every 24 hours 12/30/18 1351 01/01/19 1900   12/30/18 1415  cefTRIAXone (ROCEPHIN) 1 g in sodium chloride 0.9 % 100 mL IVPB     1 g 200 mL/hr over 30 Minutes Intravenous Every 24 hours 12/30/18 1401 01/03/19 1358   12/30/18 1400  cefTRIAXone (ROCEPHIN) injection 1 g  Status:  Discontinued     1 g Intramuscular Every 24 hours 12/30/18 1351 12/30/18 1400   12/30/18 1200  remdesivir 200 mg in sodium chloride 0.9% 250 mL IVPB     200 mg 580 mL/hr over 30 Minutes Intravenous Once 12/30/18 1049 12/30/18 1306       I have personally reviewed the following labs and images: CBC: Recent Labs  Lab 01/05/19 0121 01/06/19 0550 01/07/19 0620 01/08/19 0140 01/10/19 0031  WBC 9.6 11.8* 12.4* 11.4* 11.4*  NEUTROABS 8.0* 9.7* 9.7* 9.2* 8.1*  HGB 10.8* 10.8* 11.2* 10.9* 10.0*  HCT 35.7* 35.0* 36.4 35.7* 33.8*  MCV 84.4 83.9 83.7 83.2 85.1  PLT 245 227 212 198 158   BMP &GFR Recent Labs  Lab 01/06/19 0550 01/07/19 0620 01/08/19 0140 01/09/19 0320 01/09/19 0500 01/10/19 0031  NA 136 137 139  --  140 141  K 3.2* 2.7* 2.8*  --  3.0* 3.1*  CL 92* 90* 88*  --  94* 94*  CO2 33* 33* 33*  --  34* 33*  GLUCOSE 108* 68* 105*  --  118* 164*  BUN 20 18 23*  --  24* 22*  CREATININE 0.59 0.70 0.79  --  0.78 0.70  CALCIUM 8.0* 8.0* 7.8*  --  7.9* 7.9*  MG 1.8 1.7 1.9 2.2  --  1.8  PHOS 1.5* 2.3* 2.4* 1.9*  --  2.4*   Estimated Creatinine Clearance: 64.5 mL/min (by C-G formula based on SCr of 0.7 mg/dL). Liver & Pancreas: Recent Labs  Lab 01/06/19 0550 01/07/19 0620  01/08/19 0140 01/09/19 0500 01/10/19 0031  AST 53* 81* 85* 82* 78*  ALT 29 35 35 34 41  ALKPHOS 203* 267* 278* 272* 266*  BILITOT 0.7 0.6 1.2 0.4 0.8  PROT 5.4* 5.5* 5.6* 5.5* 5.7*  ALBUMIN 2.0* 2.1* 2.2* 2.0* 2.1*   Recent Labs  Lab 01/05/19 0121 01/06/19 0550 01/07/19 0620 01/08/19 0140 01/09/19 0320  LIPASE 74* 97* 63* 89* 74*   No results for input(s): AMMONIA in the last 168 hours. Diabetic: No results for input(s): HGBA1C in the last 72 hours.  Recent Labs  Lab 01/10/19 1559 01/10/19 2120 01/11/19 0341 01/11/19 0733 01/11/19 1132  GLUCAP 169* 179* 129* 118* 276*   Cardiac Enzymes: No results for input(s): CKTOTAL, CKMB, CKMBINDEX, TROPONINI in the last 168 hours. No results for input(s): PROBNP in the last 8760 hours. Coagulation Profile: Recent Labs  Lab 01/09/19 0500  INR 1.0   Thyroid Function Tests: No results for input(s): TSH, T4TOTAL, FREET4, T3FREE, THYROIDAB in the last 72 hours. Lipid Profile: No results for input(s): CHOL, HDL, LDLCALC, TRIG, CHOLHDL, LDLDIRECT in the last 72 hours. Anemia Panel: No results for input(s): VITAMINB12, FOLATE, FERRITIN, TIBC, IRON, RETICCTPCT in the last 72 hours. Urine analysis: No results found for: COLORURINE, APPEARANCEUR, LABSPEC, PHURINE, GLUCOSEU, HGBUR, BILIRUBINUR, KETONESUR, PROTEINUR, UROBILINOGEN, NITRITE, LEUKOCYTESUR Sepsis Labs: Invalid input(s): PROCALCITONIN, Kimberly  Microbiology: No results found for this or any previous visit (from the past 240 hour(s)).  Radiology Studies: No results found.   45 minutes with more than 50% spent in reviewing records, counseling patient/family and coordinating care.   Juanitta Earnhardt T. Porum  If 7PM-7AM, please contact night-coverage [email or web address] Password [email or web address] 01/11/2019, 1:19 PM

## 2019-01-11 NOTE — Progress Notes (Signed)
Physical Therapy Treatment Patient Details Name: Emily Thomas MRN: [id number] DOB: 1960/08/05 Today's Date: 01/11/2019    History of Present Illness 59 y.o. female  with PMH including degenerative disc disease, chronic back pain, DM type 2, HTN, PE no longer on anticoagulation, CAD, combined CHF admitted on 12/30/2018 with shortness of breath. Patient transferred from [facility] for Covid pneumonia and acute respiratory failure with hypoxia. Patient's course complicated by acute pancreatitis. ECHO 76/28 revealed systolic/diastolic heart failure with EF 35-40%. Abdominal CT revealed liver and lung mass, suspicious for RLL metastatic lung cancer and sclerotic lesions. CT head suspicious for metastases in left occipital lobe.     PT Comments    Pt continues cooperative but with SOB with min exertion.  Pt ambulated increased distance in halls with standing rest breaks and maintained SaO2 above 93% on 3L.  Follow Up Recommendations  Home health PT     Equipment Recommendations  Other (comment)(RW TBD)    Recommendations for Other Services OT consult     Precautions / Restrictions Precautions Precautions: Fall Precaution Comments: especially after taking pain meds, some BM incontinence and unawares, patient's SPO2 89% on Ra after ambulated Restrictions Weight Bearing Restrictions: No    Mobility  Bed Mobility Overal bed mobility: Needs Assistance Bed Mobility: Supine to Sit     Supine to sit: Supervision;HOB elevated        Transfers Overall transfer level: Needs assistance Equipment used: Rolling walker (2 wheeled) Transfers: Sit to/from Stand Sit to Stand: Min guard         General transfer comment: steady assist  Ambulation/Gait Ambulation/Gait assistance: Min guard Gait Distance (Feet): 55 Feet Assistive device: Rolling walker (2 wheeled) Gait Pattern/deviations: Step-through pattern     General Gait Details: Pt with increased general instability  requiring use of RW to stabilize for ambulation.  Several standing rest breaks required 2* SOB but pt maintained SaO2 above 93% on 3L`   Stairs             Wheelchair Mobility    Modified Rankin (Stroke Patients Only)       Balance Overall balance assessment: Needs assistance Sitting-balance support: No upper extremity supported;Feet supported Sitting balance-Leahy Scale: Good     Standing balance support: No upper extremity supported Standing balance-Leahy Scale: Fair                              Cognition Arousal/Alertness: Awake/alert Behavior During Therapy: Flat affect Overall Cognitive Status: No family/caregiver present to determine baseline cognitive functioning                                        Exercises      General Comments        Pertinent Vitals/Pain Pain Assessment: 0-10 Pain Score: 5  Pain Location: abdomen and back Pain Descriptors / Indicators: Constant;Discomfort;Grimacing Pain Intervention(s): Limited activity within patient's tolerance;Monitored during session    Home Living                      Prior Function            PT Goals (current goals can now be found in the care plan section) Acute Rehab PT Goals Patient Stated Goal: get my biopsy today PT Goal Formulation: With patient Time For Goal Achievement: 01/14/19 Potential to Achieve  Goals: Fair Progress towards PT goals: Progressing toward goals    Frequency    Min 3X/week      PT Plan Current plan remains appropriate    Co-evaluation              AM-PAC PT "6 Clicks" Mobility   Outcome Measure  Help needed turning from your back to your side while in a flat bed without using bedrails?: None Help needed moving from lying on your back to sitting on the side of a flat bed without using bedrails?: None Help needed moving to and from a bed to a chair (including a wheelchair)?: A Little Help needed standing up from a  chair using your arms (e.g., wheelchair or bedside chair)?: A Little Help needed to walk in hospital room?: A Little Help needed climbing 3-5 steps with a railing? : A Lot 6 Click Score: 19    End of Session Equipment Utilized During Treatment: Oxygen;Gait belt Activity Tolerance: Patient limited by fatigue;Other (comment) Patient left: in chair;with call bell/phone within reach;with chair alarm set Nurse Communication: Mobility status PT Visit Diagnosis: Other abnormalities of gait and mobility (R26.89);History of falling (Z91.81)     Time: 8338-2505 PT Time Calculation (min) (ACUTE ONLY): 18 min  Charges:  $Gait Training: 8-22 mins                     [place] Pager [phone] Office [phone]    Fardeen Steinberger 01/11/2019, 2:04 PM

## 2019-01-11 NOTE — Progress Notes (Signed)
Patient transferred to [facility], room 1535 per stretcher per PTAR for oncology services and biopsy to liver.  Patient awake and alert and in no distress on departure.  Report given to nurse earlier in shift.  Nurse receiving patient made aware of patients departure.

## 2019-01-11 NOTE — Progress Notes (Signed)
Pt received from [facility] and transferred to bed from stretcher. Report was received prior on DAY shift, no updates given at time of notification.

## 2019-01-12 LAB — COMPREHENSIVE METABOLIC PANEL
ALT: 51 U/L — ABNORMAL HIGH (ref 0–44)
AST: 85 U/L — ABNORMAL HIGH (ref 15–41)
Albumin: 2 g/dL — ABNORMAL LOW (ref 3.5–5.0)
Alkaline Phosphatase: 282 U/L — ABNORMAL HIGH (ref 38–126)
Anion gap: 11 (ref 5–15)
BUN: 24 mg/dL — ABNORMAL HIGH (ref 6–20)
CO2: 36 mmol/L — ABNORMAL HIGH (ref 22–32)
Calcium: 8 mg/dL — ABNORMAL LOW (ref 8.9–10.3)
Chloride: 93 mmol/L — ABNORMAL LOW (ref 98–111)
Creatinine, Ser: 0.71 mg/dL (ref 0.44–1.00)
GFR calc Af Amer: >60 mL/min (ref >60)
GFR calc non Af Amer: >60 mL/min (ref >60)
Glucose, Bld: 100 mg/dL — ABNORMAL HIGH (ref 70–99)
Potassium: 2.6 mmol/L — CL (ref 3.5–5.1)
Sodium: 140 mmol/L (ref 135–145)
Total Bilirubin: 0.7 mg/dL (ref 0.3–1.2)
Total Protein: 5.2 g/dL — ABNORMAL LOW (ref 6.5–8.1)

## 2019-01-12 LAB — DIFFERENTIAL
Abs Immature Granulocytes: 0.72 10*3/uL — ABNORMAL HIGH (ref 0.00–0.07)
Basophils Absolute: 0 10*3/uL (ref 0.0–0.1)
Basophils Relative: 0 %
Eosinophils Absolute: 0 10*3/uL (ref 0.0–0.5)
Eosinophils Relative: 0 %
Immature Granulocytes: 7 %
Lymphocytes Relative: 9 %
Lymphs Abs: 0.9 10*3/uL (ref 0.7–4.0)
Monocytes Absolute: 0.5 10*3/uL (ref 0.1–1.0)
Monocytes Relative: 5 %
Neutro Abs: 8.1 10*3/uL — ABNORMAL HIGH (ref 1.7–7.7)
Neutrophils Relative %: 79 %

## 2019-01-12 LAB — CBC
HCT: 32.5 % — ABNORMAL LOW (ref 36.0–46.0)
Hemoglobin: 9.7 g/dL — ABNORMAL LOW (ref 12.0–15.0)
MCH: 25.8 pg — ABNORMAL LOW (ref 26.0–34.0)
MCHC: 29.8 g/dL — ABNORMAL LOW (ref 30.0–36.0)
MCV: 86.4 fL (ref 80.0–100.0)
Platelets: 126 10*3/uL — ABNORMAL LOW (ref 150–400)
RBC: 3.76 MIL/uL — ABNORMAL LOW (ref 3.87–5.11)
RDW: 16.4 % — ABNORMAL HIGH (ref 11.5–15.5)
WBC: 10.3 10*3/uL (ref 4.0–10.5)
nRBC: 0.5 % — ABNORMAL HIGH (ref 0.0–0.2)

## 2019-01-12 LAB — MAGNESIUM: Magnesium: 1.8 mg/dL (ref 1.7–2.4)

## 2019-01-12 LAB — GLUCOSE, CAPILLARY
Glucose-Capillary: 121 mg/dL — ABNORMAL HIGH (ref 70–99)
Glucose-Capillary: 161 mg/dL — ABNORMAL HIGH (ref 70–99)
Glucose-Capillary: 215 mg/dL — ABNORMAL HIGH (ref 70–99)
Glucose-Capillary: 85 mg/dL (ref 70–99)
Glucose-Capillary: 89 mg/dL (ref 70–99)
Glucose-Capillary: 92 mg/dL (ref 70–99)
Glucose-Capillary: 98 mg/dL (ref 70–99)

## 2019-01-12 LAB — PHOSPHORUS: Phosphorus: 3.1 mg/dL (ref 2.5–4.6)

## 2019-01-12 LAB — LIPASE, BLOOD: Lipase: 104 U/L — ABNORMAL HIGH (ref 11–51)

## 2019-01-12 MED ORDER — ENOXAPARIN SODIUM 40 MG/0.4ML ~~LOC~~ SOLN
40.0000 mg | Freq: Once | SUBCUTANEOUS | Status: AC
  Administered 2019-01-12: 40 mg via SUBCUTANEOUS
  Filled 2019-01-12: qty 0.4

## 2019-01-12 MED ORDER — POTASSIUM CHLORIDE CRYS ER 20 MEQ PO TBCR
40.0000 meq | EXTENDED_RELEASE_TABLET | ORAL | Status: AC
  Administered 2019-01-12 (×3): 40 meq via ORAL
  Filled 2019-01-12 (×3): qty 2

## 2019-01-12 MED ORDER — SPIRONOLACTONE 25 MG PO TABS
25.0000 mg | ORAL_TABLET | Freq: Every day | ORAL | Status: DC
  Administered 2019-01-12 – 2019-01-16 (×5): 25 mg via ORAL
  Filled 2019-01-12 (×5): qty 1

## 2019-01-12 MED ORDER — TORSEMIDE 20 MG PO TABS
40.0000 mg | ORAL_TABLET | Freq: Every day | ORAL | Status: DC
  Administered 2019-01-13 – 2019-01-16 (×4): 40 mg via ORAL
  Filled 2019-01-12 (×5): qty 2

## 2019-01-12 MED ORDER — POTASSIUM CHLORIDE 20 MEQ PO PACK
40.0000 meq | PACK | Freq: Once | ORAL | Status: AC
  Administered 2019-01-12: 07:00:00 40 meq via ORAL
  Filled 2019-01-12: qty 2

## 2019-01-12 MED ORDER — HYDROMORPHONE HCL 1 MG/ML IJ SOLN
1.0000 mg | INTRAMUSCULAR | Status: DC | PRN
  Administered 2019-01-12 – 2019-01-13 (×6): 1 mg via INTRAVENOUS
  Filled 2019-01-12 (×6): qty 1

## 2019-01-12 NOTE — Progress Notes (Signed)
PROGRESS NOTE  Emily Thomas ACZ:[id number] DOB: [birth year]   PCP: Patient, No Pcp Per  Patient is from: Home.  DOA: 12/30/2018 LOS: 29  Brief Narrative / Interim history: 59 year old female with history of combined CHF, CAD, DM-2, tobacco use disorder, DDD/chronic back pain,  remote PE not on anticoagulation, gastric ulcer/GERD and hiatal hernia presented to [place] with fatigue, myalgia and shortness of breath since he had bowel prep for EGD/colonoscopy mid December.  Diagnosed with acute respiratory failure with hypoxia due to COVID-19 pneumonia and transfer to [facility] for further care.  Upon further evaluation, found to have RLL lung cancer/mass concerning for metastasis to liver, intrathoracic lymph nodes, adrenal glands, thoracic spine and brain.  Case discussed with IR, Dr. Earleen Newport who was suggested transfer to [facility] for oncology input and possible liver biopsy if patient wishes to pursue treatment.  Patient completed 5 days of remdesivir course on 01/03/2019.   Subjective: No major events overnight of this morning.  Continues to endorse severe abdominal pain and back pain.  Abdominal pain is diffuse.  Worse with diet.  Denies numbness or tingling in her legs.  Denies bowel or bladder issue.  Denies dyspnea.  Objective: Vitals:   01/11/19 1423 01/11/19 2000 01/12/19 0415 01/12/19 1357  BP: 118/85 117/72 126/81 126/82  Pulse: 86 79 70 76  Resp: 20 (!) 22 20 12   Temp: (!) 97.4 F (36.3 C) 97.9 F (36.6 C) 97.9 F (36.6 C) 97.8 F (36.6 C)  TempSrc: Oral Oral Oral Oral  SpO2: 95% 92% 96% 97%  Weight:      Height:        Intake/Output Summary (Last 24 hours) at 01/12/2019 1429 Last data filed at 01/11/2019 2200 Gross per 24 hour  Intake 220 ml  Output -  Net 220 ml   Filed Weights   01/08/19 0451 01/09/19 2000 01/10/19 0500  Weight: 66.6 kg 60 kg 60 kg    Examination:  GENERAL: No apparent distress.  Nontoxic. HEENT: MMM.  Vision and hearing grossly intact.  NECK:  Supple.  No apparent JVD.  RESP: 89% on RA.  No IWOB.  Fair aeration bilaterally. CVS:  RRR. Heart sounds normal.  ABD/GI/GU: Bowel sounds present. Soft.  Diffuse tenderness.  No rebound or guarding. MSK/EXT:  Moves extremities. No apparent deformity. No edema.  SKIN: no apparent skin lesion or wound NEURO: Awake, alert and oriented appropriately.  No apparent focal neuro deficit. PSYCH: Calm. Normal affect.  Procedures:  None  Assessment & Plan: Right lower lobe lung cancer/mass concerning for metastasis to liver, intrathoracic lymph nodes, adrenal glands, thoracic spine and brain.  -Pulm, Dr. Lamonte Sakai suggested liver biopsy.  IR, Dr. Earleen Newport suggested transfer to [facility] for this and oncology input. -Discussed with IR. US-guided biopsy after Plavix washout, last dose 1/9. -Discussed with oncology, Dr. Hayden Rasmussen will see patient the week of 1/11 -MRI brain with or without contrast-to evaluate soft tissue lesion noted on CT head.  Acute respiratory failure with hypoxia due to COVID-19 pneumonia: Improving.  89% on RA. -Completed 5 days of remdesivir on 01/03/2019 -On Decadron 2 mg daily-partly for potential brain mets. -Supportive care with inhalers, mucolytic's/antitussive and incentive spirometry. -OOB/PT/OT  Chronic combined CHF: Echo on 12/31 with LVEF of 35-40%, G1-DD and global hypokinesis but no other major finding.  Appears euvolemic on exam.  I&O incomplete.  Weight downtrending.  Renal function stable. -Discontinue IV Lasix.  Has already received 1 dose this morning.  Switch to torsemide 40 mg daily 1/11. -  GDMT-lisinopril and metoprolol.  Added Aldactone. -Monitor fluid status and renal function -Monitor and replenish electrolytes aggressively.  Mildly elevated lipase: No evidence of pancreatitis on imaging.  Still with significant nonlocalized abdominal pain -Pain control -Downgrade diet to full liquid -Monitor lipase. -Repeat CT abdomen and pelvis if no improvement.  Acute  on chronic pain/DDD in the setting of cancer with possible mets thoracic spine -P.o. morphine 15 mg every 6 hours, fentanyl patch and Lidoderm patch -Change IV morphine to IV Dilaudid -Bowel regimen.  Uncontrolled DM-2 with hyperglycemia: A1c 7.2% on 12/28. Recent Labs    01/12/19 0412 01/12/19 0829 01/12/19 1207  GLUCAP 92 98 215*  -Continue Lantus 5 units daily, SSI-moderate and Tradjenta -Continue Crestor -CBG monitoring  COPD exacerbation: Likely due to COVID-19 infection.  Stable now -Steroid and breathing treatments as above -Encourage smoking cessation.  History of CAD s/p PCI: Stable no anginal symptoms. -Lisinopril and metoprolol as above -Continue aspirin.  Hold Plavix pending liver biopsy. -Continue statin.  Essential hypertension: Normotensive. -Cardiac meds as above  Remote history of PE: No longer on anticoagulation.  She is high risk for VTE given malignancy -Lovenox to subcu heparin for VTE prophylaxis pending biopsy.  Anxiety/insomnia:  -Scheduled Klonopin 1 mg twice daily instead of as needed Ativan -Added BuSpar 5 mg 3 times daily. -Continue home Cymbalta.  Tobacco use disorder -Encourage cessation -Offer nicotine patch.  Hypokalemia/hypomagnesemia/hypophosphatemia -Replenish and recheck as appropriate  History of PUD/GERD: -Continue Protonix and Carafate.  Goal of care discussion: Patient with significant cardiopulmonary comorbidities as above.  Now with right lung mass concerning for metastatic lung cancer to multiple organs.  Patient wishes to pursue biopsy and aggressive treatment.  She wishes to remain full code.  Palliative care involved. -Follow palliative recommendations     Nutrition Problem: Increased nutrient needs Etiology: catabolic JASNKNL([id number]; new metastatic cancer)  Signs/Symptoms: estimated needs  Interventions: Ensure Enlive (each supplement provides 350kcal and 20 grams of protein), Magic cup, Hormel Shake   DVT  prophylaxis: Subcu heparin Code Status: Full code-confirmed with patient's. Family Communication: Updated patient's son, Herbie Baltimore and daughter-in-law, Elmo Putt over the phone on 1/9. Disposition Plan: Remains inpatient Consultants: Pulm (off), IR, oncology   Microbiology summarized: -[id number] positive at [facility].  Sch Meds:  Scheduled Meds: . vitamin C  500 mg Oral Daily  . aspirin  81 mg Oral Daily  . busPIRone  5 mg Oral TID  . clonazePAM  1 mg Oral BID  . dexamethasone  2 mg Oral Daily  . DULoxetine  30 mg Oral Daily  . famotidine  20 mg Oral Daily  . feeding supplement (ENSURE ENLIVE)  237 mL Oral BID BM  . fentaNYL  1 patch Transdermal Q72H  . furosemide  40 mg Intravenous BID  . heparin injection (subcutaneous)  5,000 Units Subcutaneous Q8H  . insulin aspart  0-15 Units Subcutaneous Q4H  . insulin glargine  5 Units Subcutaneous Daily  . Ipratropium-Albuterol  1 puff Inhalation Q6H  . lisinopril  10 mg Oral Daily  . metoprolol succinate  50 mg Oral Daily  . pantoprazole  40 mg Oral BID  . polyethylene glycol  17 g Oral Daily  . potassium chloride  40 mEq Oral Q4H  . senna-docusate  2 tablet Oral QHS  . sodium chloride flush  3 mL Intravenous Q12H  . spironolactone  25 mg Oral Daily  . sucralfate  1 g Oral TID AC & HS  . zinc sulfate  220 mg Oral Daily   Continuous  Infusions: . sodium chloride     PRN Meds:.sodium chloride, acetaminophen, albuterol, alum & mag hydroxide-simeth **AND** lidocaine, antiseptic oral rinse, bisacodyl, chlorpheniramine-HYDROcodone, guaiFENesin-dextromethorphan, HYDROmorphone (DILAUDID) injection, morphine, nitroGLYCERIN, ondansetron **OR** ondansetron (ZOFRAN) IV, sodium chloride, sodium chloride flush  Antimicrobials: Anti-infectives (From admission, onward)   Start     Dose/Rate Route Frequency Ordered Stop   01/03/19 0800  remdesivir 100 mg in sodium chloride 0.9 % 100 mL IVPB     100 mg 200 mL/hr over 30 Minutes Intravenous  Once 01/02/19  1737 01/03/19 0910   12/31/18 1000  remdesivir 100 mg in sodium chloride 0.9 % 100 mL IVPB  Status:  Discontinued     100 mg 200 mL/hr over 30 Minutes Intravenous Daily 12/30/18 1049 12/30/18 1432   12/31/18 1000  remdesivir 100 mg in sodium chloride 0.9 % 100 mL IVPB     100 mg 200 mL/hr over 30 Minutes Intravenous Daily 12/30/18 1432 01/02/19 1208   12/30/18 1500  azithromycin (ZITHROMAX) 500 mg in sodium chloride 0.9 % 250 mL IVPB     500 mg 250 mL/hr over 60 Minutes Intravenous Every 24 hours 12/30/18 1351 01/01/19 1900   12/30/18 1415  cefTRIAXone (ROCEPHIN) 1 g in sodium chloride 0.9 % 100 mL IVPB     1 g 200 mL/hr over 30 Minutes Intravenous Every 24 hours 12/30/18 1401 01/03/19 1358   12/30/18 1400  cefTRIAXone (ROCEPHIN) injection 1 g  Status:  Discontinued     1 g Intramuscular Every 24 hours 12/30/18 1351 12/30/18 1400   12/30/18 1200  remdesivir 200 mg in sodium chloride 0.9% 250 mL IVPB     200 mg 580 mL/hr over 30 Minutes Intravenous Once 12/30/18 1049 12/30/18 1306       I have personally reviewed the following labs and images: CBC: Recent Labs  Lab 01/06/19 0550 01/07/19 0620 01/08/19 0140 01/10/19 0031 01/12/19 0515  WBC 11.8* 12.4* 11.4* 11.4* 10.3  NEUTROABS 9.7* 9.7* 9.2* 8.1* 8.1*  HGB 10.8* 11.2* 10.9* 10.0* 9.7*  HCT 35.0* 36.4 35.7* 33.8* 32.5*  MCV 83.9 83.7 83.2 85.1 86.4  PLT 227 212 198 158 126*   BMP &GFR Recent Labs  Lab 01/07/19 0620 01/08/19 0140 01/09/19 0320 01/09/19 0500 01/10/19 0031 01/12/19 0515  NA 137 139  --  140 141 140  K 2.7* 2.8*  --  3.0* 3.1* 2.6*  CL 90* 88*  --  94* 94* 93*  CO2 33* 33*  --  34* 33* 36*  GLUCOSE 68* 105*  --  118* 164* 100*  BUN 18 23*  --  24* 22* 24*  CREATININE 0.70 0.79  --  0.78 0.70 0.71  CALCIUM 8.0* 7.8*  --  7.9* 7.9* 8.0*  MG 1.7 1.9 2.2  --  1.8 1.8  PHOS 2.3* 2.4* 1.9*  --  2.4* 3.1   Estimated Creatinine Clearance: 64.5 mL/min (by C-G formula based on SCr of 0.71 mg/dL). Liver &  Pancreas: Recent Labs  Lab 01/07/19 0620 01/08/19 0140 01/09/19 0500 01/10/19 0031 01/12/19 0515  AST 81* 85* 82* 78* 85*  ALT 35 35 34 41 51*  ALKPHOS 267* 278* 272* 266* 282*  BILITOT 0.6 1.2 0.4 0.8 0.7  PROT 5.5* 5.6* 5.5* 5.7* 5.2*  ALBUMIN 2.1* 2.2* 2.0* 2.1* 2.0*   Recent Labs  Lab 01/06/19 0550 01/07/19 0620 01/08/19 0140 01/09/19 0320 01/12/19 0204  LIPASE 97* 63* 89* 74* 104*   No results for input(s): AMMONIA in the last 168 hours. Diabetic: No  results for input(s): HGBA1C in the last 72 hours. Recent Labs  Lab 01/11/19 2001 01/12/19 0000 01/12/19 0412 01/12/19 0829 01/12/19 1207  GLUCAP 132* 89 92 98 215*   Cardiac Enzymes: No results for input(s): CKTOTAL, CKMB, CKMBINDEX, TROPONINI in the last 168 hours. No results for input(s): PROBNP in the last 8760 hours. Coagulation Profile: Recent Labs  Lab 01/09/19 0500  INR 1.0   Thyroid Function Tests: No results for input(s): TSH, T4TOTAL, FREET4, T3FREE, THYROIDAB in the last 72 hours. Lipid Profile: No results for input(s): CHOL, HDL, LDLCALC, TRIG, CHOLHDL, LDLDIRECT in the last 72 hours. Anemia Panel: No results for input(s): VITAMINB12, FOLATE, FERRITIN, TIBC, IRON, RETICCTPCT in the last 72 hours. Urine analysis: No results found for: COLORURINE, APPEARANCEUR, LABSPEC, PHURINE, GLUCOSEU, HGBUR, BILIRUBINUR, KETONESUR, PROTEINUR, UROBILINOGEN, NITRITE, LEUKOCYTESUR Sepsis Labs: Invalid input(s): PROCALCITONIN, [place]  Microbiology: No results found for this or any previous visit (from the past 240 hour(s)).  Radiology Studies: No results found.   T. [place]  If 7PM-7AM, please contact night-coverage [email or web address] Password [facility] 01/12/2019, 2:29 PM

## 2019-01-13 ENCOUNTER — Inpatient Hospital Stay (HOSPITAL_COMMUNITY): Payer: Medicaid Other

## 2019-01-13 LAB — COMPREHENSIVE METABOLIC PANEL
ALT: 72 U/L — ABNORMAL HIGH (ref 0–44)
AST: 124 U/L — ABNORMAL HIGH (ref 15–41)
Albumin: 2.1 g/dL — ABNORMAL LOW (ref 3.5–5.0)
Alkaline Phosphatase: 319 U/L — ABNORMAL HIGH (ref 38–126)
Anion gap: 9 (ref 5–15)
BUN: 27 mg/dL — ABNORMAL HIGH (ref 6–20)
CO2: 34 mmol/L — ABNORMAL HIGH (ref 22–32)
Calcium: 8.5 mg/dL — ABNORMAL LOW (ref 8.9–10.3)
Chloride: 99 mmol/L (ref 98–111)
Creatinine, Ser: 0.75 mg/dL (ref 0.44–1.00)
GFR calc Af Amer: >60 mL/min (ref >60)
GFR calc non Af Amer: >60 mL/min (ref >60)
Glucose, Bld: 105 mg/dL — ABNORMAL HIGH (ref 70–99)
Potassium: 4.8 mmol/L (ref 3.5–5.1)
Sodium: 142 mmol/L (ref 135–145)
Total Bilirubin: 0.6 mg/dL (ref 0.3–1.2)
Total Protein: 5.4 g/dL — ABNORMAL LOW (ref 6.5–8.1)

## 2019-01-13 LAB — CBC WITH DIFFERENTIAL/PLATELET
Abs Immature Granulocytes: 0.92 10*3/uL — ABNORMAL HIGH (ref 0.00–0.07)
Basophils Absolute: 0.1 10*3/uL (ref 0.0–0.1)
Basophils Relative: 1 %
Eosinophils Absolute: 0 10*3/uL (ref 0.0–0.5)
Eosinophils Relative: 0 %
HCT: 32.7 % — ABNORMAL LOW (ref 36.0–46.0)
Hemoglobin: 9.6 g/dL — ABNORMAL LOW (ref 12.0–15.0)
Immature Granulocytes: 8 %
Lymphocytes Relative: 9 %
Lymphs Abs: 1 10*3/uL (ref 0.7–4.0)
MCH: 25.5 pg — ABNORMAL LOW (ref 26.0–34.0)
MCHC: 29.4 g/dL — ABNORMAL LOW (ref 30.0–36.0)
MCV: 87 fL (ref 80.0–100.0)
Monocytes Absolute: 0.7 10*3/uL (ref 0.1–1.0)
Monocytes Relative: 6 %
Neutro Abs: 8.5 10*3/uL — ABNORMAL HIGH (ref 1.7–7.7)
Neutrophils Relative %: 76 %
Platelets: 136 10*3/uL — ABNORMAL LOW (ref 150–400)
RBC: 3.76 MIL/uL — ABNORMAL LOW (ref 3.87–5.11)
RDW: 16.8 % — ABNORMAL HIGH (ref 11.5–15.5)
WBC: 11.2 10*3/uL — ABNORMAL HIGH (ref 4.0–10.5)
nRBC: 0.6 % — ABNORMAL HIGH (ref 0.0–0.2)

## 2019-01-13 LAB — GLUCOSE, CAPILLARY
Glucose-Capillary: 107 mg/dL — ABNORMAL HIGH (ref 70–99)
Glucose-Capillary: 111 mg/dL — ABNORMAL HIGH (ref 70–99)
Glucose-Capillary: 132 mg/dL — ABNORMAL HIGH (ref 70–99)
Glucose-Capillary: 178 mg/dL — ABNORMAL HIGH (ref 70–99)
Glucose-Capillary: 242 mg/dL — ABNORMAL HIGH (ref 70–99)
Glucose-Capillary: 255 mg/dL — ABNORMAL HIGH (ref 70–99)
Glucose-Capillary: 57 mg/dL — ABNORMAL LOW (ref 70–99)
Glucose-Capillary: 78 mg/dL (ref 70–99)

## 2019-01-13 LAB — PHOSPHORUS: Phosphorus: 1.8 mg/dL — ABNORMAL LOW (ref 2.5–4.6)

## 2019-01-13 LAB — PTH-RELATED PEPTIDE: PTH-related peptide: <2 pmol/L

## 2019-01-13 LAB — MAGNESIUM: Magnesium: 2.2 mg/dL (ref 1.7–2.4)

## 2019-01-13 LAB — LIPASE, BLOOD: Lipase: 81 U/L — ABNORMAL HIGH (ref 11–51)

## 2019-01-13 MED ORDER — IPRATROPIUM-ALBUTEROL 20-100 MCG/ACT IN AERS
1.0000 | INHALATION_SPRAY | Freq: Three times a day (TID) | RESPIRATORY_TRACT | Status: DC
  Administered 2019-01-14 – 2019-01-18 (×13): 1 via RESPIRATORY_TRACT
  Filled 2019-01-13: qty 4

## 2019-01-13 MED ORDER — SODIUM PHOSPHATES 45 MMOLE/15ML IV SOLN
30.0000 mmol | Freq: Once | INTRAVENOUS | Status: AC
  Administered 2019-01-13: 30 mmol via INTRAVENOUS
  Filled 2019-01-13: qty 10

## 2019-01-13 MED ORDER — LORAZEPAM 2 MG/ML IJ SOLN
1.0000 mg | Freq: Once | INTRAMUSCULAR | Status: AC | PRN
  Administered 2019-01-13: 1 mg via INTRAVENOUS
  Filled 2019-01-13: qty 1

## 2019-01-13 MED ORDER — GADOBUTROL 1 MMOL/ML IV SOLN
7.5000 mL | Freq: Once | INTRAVENOUS | Status: AC | PRN
  Administered 2019-01-13: 7.5 mL via INTRAVENOUS

## 2019-01-13 MED ORDER — HYDROMORPHONE HCL 1 MG/ML IJ SOLN
1.5000 mg | INTRAMUSCULAR | Status: DC | PRN
  Administered 2019-01-13 – 2019-01-14 (×3): 1.5 mg via INTRAVENOUS
  Filled 2019-01-13 (×3): qty 1.5

## 2019-01-13 NOTE — Progress Notes (Signed)
PROGRESS NOTE  Emily Thomas ATF:[id number] DOB: 1960-02-24   PCP: Patient, No Pcp Per  Patient is from: Home.  DOA: 12/30/2018 LOS: 43  Brief Narrative / Interim history: 59 year old female with history of combined CHF, CAD, DM-2, tobacco use disorder, DDD/chronic back pain,  remote PE not on anticoagulation, gastric ulcer/GERD and hiatal hernia presented to [facility] with fatigue, myalgia and shortness of breath since he had bowel prep for EGD/colonoscopy mid December.  Diagnosed with acute respiratory failure with hypoxia due to COVID-19 pneumonia and transfer to [facility] for further care.  Upon further evaluation, found to have RLL lung cancer/mass concerning for metastasis to liver, intrathoracic lymph nodes, adrenal glands, skull, cervical and thoracic spines. Case discussed with IR, Dr. Earleen Newport who was suggested transfer to [facility] for oncology input and possible liver biopsy if patient wishes to pursue treatment.  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   Patient completed 5 days of remdesivir course on 01/03/2019.   Subjective: No major events overnight of this morning.  Reports that she had a good night.  Pain improved.  Still triggered by meals.  Breathing better.  Still with intermittent cough.  Denies nausea or vomiting.   Objective: Vitals:   01/12/19 0415 01/12/19 1357 01/13/19 0503 01/13/19 1338  BP: 126/81 126/82 139/87 92/65  Pulse: 70 76 73 72  Resp: 20 12 19 18   Temp: 97.9 F (36.6 C) 97.8 F (36.6 C) 97.6 F (36.4 C) 98 F (36.7 C)  TempSrc: Oral Oral Oral Oral  SpO2: 96% 97% 96% 95%  Weight:      Height:        Intake/Output Summary (Last 24 hours) at 01/13/2019 1414 Last data filed at 01/13/2019 1045 Gross per 24 hour  Intake 520.98 ml  Output 250 ml  Net 270.98 ml   Filed Weights   01/08/19  0451 01/09/19 2000 01/10/19 0500  Weight: 66.6 kg 60 kg 60 kg    Examination:  GENERAL: No apparent distress.  Nontoxic. HEENT: MMM.  Vision and hearing grossly intact.  NECK: Supple.  No apparent JVD.  RESP: 96% on 2 L.  No IWOB.  Diminished aeration on the right.  Rhonchi on the left. CVS:  RRR. Heart sounds normal.  ABD/GI/GU: Bowel sounds present. Soft.  No significant tenderness. MSK/EXT:  Moves extremities. No apparent deformity. No edema.  SKIN: no apparent skin lesion or wound NEURO: Awake, alert and oriented appropriately.  No apparent focal neuro deficit. PSYCH: Calm. Normal affect.  Procedures:  None  Assessment & Plan: Right lower lobe lung cancer/mass concerning for metastasis to liver, intrathoracic lymph nodes, adrenal glands, cervical and thoracic spines, and skull -Plan for US-guided liver biopsy this week after Plavix washout.  Last dose of Plavix 1/9. -Discussed with oncology, Dr. Hayden Rasmussen will see patient the week of 1/11  Acute respiratory failure with hypoxia due to COVID-19 pneumonia: Improving.  -Completed 5 days of remdesivir on 01/03/2019 -On Decadron 2 mg daily-partly for potential brain mets. -Supportive care with inhalers, mucolytic's/antitussive and incentive spirometry. -OOB/PT/OT -Wean oxygen to room air.  Chronic combined CHF: Echo on 12/31 with LVEF of 35-40%, G1-DD and global hypokinesis but no other major finding.  Appears euvolemic on exam.  I&O incomplete.  Weight downtrending.  Renal function stable. -Switch to torsemide 40 mg daily -GDMT-lisinopril and metoprolol.  Added Aldactone. -Monitor fluid  status and renal function -Monitor and replenish electrolytes aggressively.  Mildly elevated lipase/abdominal pain: No evidence of pancreatitis on imaging.  Improved with IV Dilaudid. -Pain control -Monitor lipase.  Acute on chronic pain/DDD in the setting of cancer with possible mets thoracic spine -P.o. morphine 15 mg every 6 hours,  fentanyl patch and Lidoderm patch -Change IV morphine to IV Dilaudid -Bowel regimen.  Uncontrolled DM-2 with hyperglycemia: A1c 7.2% on 12/28. Recent Labs    01/13/19 0313 01/13/19 0740 01/13/19 1153  GLUCAP 107* 111* 178*  -Continue Lantus 5 units daily, SSI-moderate and Tradjenta -Continue Crestor -CBG monitoring  Transaminitis/elevated alkaline phosphatase: Slightly up trended.  Could be due to COVID-19 or malignancy. -Continue trending -Check CK and acute hepatitis panel. -RUQ [place] if no improvement  COPD exacerbation: Likely due to COVID-19 infection.  Stable now -Steroid and breathing treatments as above -Encourage smoking cessation. -Wean oxygen as able.  History of CAD s/p PCI: Stable no anginal symptoms. -Lisinopril and metoprolol as above -Continue aspirin.  Plavix on hold for planned procedure. -Continue statin.  Essential hypertension: Normotensive. -Cardiac meds as above  Remote history of PE: No longer on anticoagulation.  She is high risk for VTE given malignancy -Lovenox to subcu heparin for VTE prophylaxis pending biopsy.  Anxiety/insomnia:  -Scheduled Klonopin 1 mg twice daily instead of as needed Ativan -Added BuSpar 5 mg 3 times daily. -Continue home Cymbalta.  Tobacco use disorder -Encourage cessation -Offer nicotine patch.  Hypokalemia/hypomagnesemia/hypophosphatemia: Hypokalemia and hypomagnesemia resolved. -Sodium phosphate for hypophosphatemia.  History of PUD/GERD: -Continue Protonix and Carafate.  Goal of care discussion: Patient with significant cardiopulmonary comorbidities as above.  Now with right lung mass concerning for metastatic lung cancer to multiple organs.  Patient wishes to pursue biopsy and aggressive treatment.  She wishes to remain full code.  Palliative care involved. -Follow palliative recommendations     Nutrition Problem: Increased nutrient needs Etiology: catabolic OZHYQMV(HQION-62; new metastatic  cancer)  Signs/Symptoms: estimated needs  Interventions: Ensure Enlive (each supplement provides 350kcal and 20 grams of protein), Magic cup, Hormel Shake   DVT prophylaxis: Subcu heparin Code Status: Full code-confirmed with patient's. Family Communication: Updated patient's daughter-in-law, Elmo Putt over the phone. Disposition Plan: Remains inpatient Consultants: Pulm (off), IR, oncology   Microbiology summarized: -[id number] positive at [facility].  Sch Meds:  Scheduled Meds: . vitamin C  500 mg Oral Daily  . aspirin  81 mg Oral Daily  . busPIRone  5 mg Oral TID  . clonazePAM  1 mg Oral BID  . dexamethasone  2 mg Oral Daily  . DULoxetine  30 mg Oral Daily  . famotidine  20 mg Oral Daily  . feeding supplement (ENSURE ENLIVE)  237 mL Oral BID BM  . fentaNYL  1 patch Transdermal Q72H  . insulin aspart  0-15 Units Subcutaneous Q4H  . insulin glargine  5 Units Subcutaneous Daily  . Ipratropium-Albuterol  1 puff Inhalation Q6H  . lisinopril  10 mg Oral Daily  . metoprolol succinate  50 mg Oral Daily  . pantoprazole  40 mg Oral BID  . polyethylene glycol  17 g Oral Daily  . senna-docusate  2 tablet Oral QHS  . sodium chloride flush  3 mL Intravenous Q12H  . spironolactone  25 mg Oral Daily  . sucralfate  1 g Oral TID AC & HS  . torsemide  40 mg Oral Daily  . zinc sulfate  220 mg Oral Daily   Continuous Infusions: . sodium chloride    . sodium phosphate  Dextrose 5% IVPB Stopped (01/13/19 1045)   PRN Meds:.sodium chloride, acetaminophen, albuterol, alum & mag hydroxide-simeth **AND** lidocaine, antiseptic oral rinse, bisacodyl, chlorpheniramine-HYDROcodone, guaiFENesin-dextromethorphan, HYDROmorphone (DILAUDID) injection, morphine, nitroGLYCERIN, ondansetron **OR** ondansetron (ZOFRAN) IV, sodium chloride, sodium chloride flush  Antimicrobials: Anti-infectives (From admission, onward)   Start     Dose/Rate Route Frequency Ordered Stop   01/03/19 0800  remdesivir 100 mg in  sodium chloride 0.9 % 100 mL IVPB     100 mg 200 mL/hr over 30 Minutes Intravenous  Once 01/02/19 1737 01/03/19 0910   12/31/18 1000  remdesivir 100 mg in sodium chloride 0.9 % 100 mL IVPB  Status:  Discontinued     100 mg 200 mL/hr over 30 Minutes Intravenous Daily 12/30/18 1049 12/30/18 1432   12/31/18 1000  remdesivir 100 mg in sodium chloride 0.9 % 100 mL IVPB     100 mg 200 mL/hr over 30 Minutes Intravenous Daily 12/30/18 1432 01/02/19 1208   12/30/18 1500  azithromycin (ZITHROMAX) 500 mg in sodium chloride 0.9 % 250 mL IVPB     500 mg 250 mL/hr over 60 Minutes Intravenous Every 24 hours 12/30/18 1351 01/01/19 1900   12/30/18 1415  cefTRIAXone (ROCEPHIN) 1 g in sodium chloride 0.9 % 100 mL IVPB     1 g 200 mL/hr over 30 Minutes Intravenous Every 24 hours 12/30/18 1401 01/03/19 1358   12/30/18 1400  cefTRIAXone (ROCEPHIN) injection 1 g  Status:  Discontinued     1 g Intramuscular Every 24 hours 12/30/18 1351 12/30/18 1400   12/30/18 1200  remdesivir 200 mg in sodium chloride 0.9% 250 mL IVPB     200 mg 580 mL/hr over 30 Minutes Intravenous Once 12/30/18 1049 12/30/18 1306       I have personally reviewed the following labs and images: CBC: Recent Labs  Lab 01/07/19 0620 01/08/19 0140 01/10/19 0031 01/12/19 0515 01/13/19 0237  WBC 12.4* 11.4* 11.4* 10.3 11.2*  NEUTROABS 9.7* 9.2* 8.1* 8.1* 8.5*  HGB 11.2* 10.9* 10.0* 9.7* 9.6*  HCT 36.4 35.7* 33.8* 32.5* 32.7*  MCV 83.7 83.2 85.1 86.4 87.0  PLT 212 198 158 126* 136*   BMP &GFR Recent Labs  Lab 01/08/19 0140 01/09/19 0320 01/09/19 0500 01/10/19 0031 01/12/19 0515 01/13/19 0237  NA 139  --  140 141 140 142  K 2.8*  --  3.0* 3.1* 2.6* 4.8  CL 88*  --  94* 94* 93* 99  CO2 33*  --  34* 33* 36* 34*  GLUCOSE 105*  --  118* 164* 100* 105*  BUN 23*  --  24* 22* 24* 27*  CREATININE 0.79  --  0.78 0.70 0.71 0.75  CALCIUM 7.8*  --  7.9* 7.9* 8.0* 8.5*  MG 1.9 2.2  --  1.8 1.8 2.2  PHOS 2.4* 1.9*  --  2.4* 3.1 1.8*    Estimated Creatinine Clearance: 64.5 mL/min (by C-G formula based on SCr of 0.75 mg/dL). Liver & Pancreas: Recent Labs  Lab 01/08/19 0140 01/09/19 0500 01/10/19 0031 01/12/19 0515 01/13/19 0237  AST 85* 82* 78* 85* 124*  ALT 35 34 41 51* 72*  ALKPHOS 278* 272* 266* 282* 319*  BILITOT 1.2 0.4 0.8 0.7 0.6  PROT 5.6* 5.5* 5.7* 5.2* 5.4*  ALBUMIN 2.2* 2.0* 2.1* 2.0* 2.1*   Recent Labs  Lab 01/07/19 0620 01/08/19 0140 01/09/19 0320 01/12/19 0204 01/13/19 0237  LIPASE 63* 89* 74* 104* 81*   No results for input(s): AMMONIA in the last 168 hours. Diabetic: No  results for input(s): HGBA1C in the last 72 hours. Recent Labs  Lab 01/12/19 1944 01/12/19 2336 01/13/19 0313 01/13/19 0740 01/13/19 1153  GLUCAP 161* 85 107* 111* 178*   Cardiac Enzymes: No results for input(s): CKTOTAL, CKMB, CKMBINDEX, TROPONINI in the last 168 hours. No results for input(s): PROBNP in the last 8760 hours. Coagulation Profile: Recent Labs  Lab 01/09/19 0500  INR 1.0   Thyroid Function Tests: No results for input(s): TSH, T4TOTAL, FREET4, T3FREE, THYROIDAB in the last 72 hours. Lipid Profile: No results for input(s): CHOL, HDL, LDLCALC, TRIG, CHOLHDL, LDLDIRECT in the last 72 hours. Anemia Panel: No results for input(s): VITAMINB12, FOLATE, FERRITIN, TIBC, IRON, RETICCTPCT in the last 72 hours. Urine analysis: No results found for: COLORURINE, APPEARANCEUR, LABSPEC, PHURINE, GLUCOSEU, HGBUR, BILIRUBINUR, KETONESUR, PROTEINUR, UROBILINOGEN, NITRITE, LEUKOCYTESUR Sepsis Labs: Invalid input(s): PROCALCITONIN, [place]  Microbiology: No results found for this or any previous visit (from the past 240 hour(s)).  Radiology Studies: MR BRAIN W WO CONTRAST  Result Date: 01/13/2019 CLINICAL DATA:  Brain mass or lesion. Right lower lobe lung cancer with metastatic disease to the liver, retroperitoneal nodes, and left adrenal gland. EXAM: MRI HEAD WITHOUT AND WITH CONTRAST TECHNIQUE:  Multiplanar, multiecho pulse sequences of the brain and surrounding structures were obtained without and with intravenous contrast. CONTRAST:  7.2mL GADAVIST GADOBUTROL 1 MMOL/ML IV SOLN COMPARISON:  CT head without contrast 01/04/2019. MR head without and with contrast 05/24/2004 FINDINGS: Brain: No acute infarct, hemorrhage, or mass lesion is present. Mild atrophy and moderate diffuse white matter disease is markedly advanced for age. No enhancing parenchymal lesions are present. No significant extraaxial fluid collection is present. The ventricles are of normal size. The brainstem and cerebellum are within normal limits. A remote lacunar infarct is present at the inferior right cerebellum. Vascular: Flow is present in the major intracranial arteries. Skull and upper cervical spine: There is diffuse heterogeneous marrow appearance the calvarium with scattered infiltrative enhancement suggesting widespread metastatic disease to the skull and upper cervical spine. No pathologic trick fracture is present. Left frontal craniotomy is noted. Sinuses/Orbits: The paranasal sinuses and mastoid air cells are clear. The globes and orbits are within normal limits. IMPRESSION: 1. No evidence for metastatic disease to the brain or meninges. 2. Diffuse heterogeneous marrow appearance suggesting widespread metastatic disease to the skull and upper cervical spine. 3. 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 4. Mild atrophy and moderate diffuse white matter disease is markedly advanced for age. This likely reflects the sequela of chronic microvascular ischemia. Electronically Signed   By: San Morelle M.D.   On: 01/13/2019 12:22    Emily Thomas [place]  If 7PM-7AM, please contact night-coverage [email or web address] Password [facility] 01/13/2019, 2:14 PM

## 2019-01-13 NOTE — Progress Notes (Signed)
Updated patients daughter on patients status.

## 2019-01-13 NOTE — Progress Notes (Signed)
Patient complaining of abdominal pain 8/10, IV Dilaudid q4h was given at 1945 and MSIR 15 mg q6h was given at 1817, patient has no other medication for pain. On Call Opy[facility] was paged.

## 2019-01-13 NOTE — Progress Notes (Signed)
On call was paged for IV Ativan because patient is scheduled for brain MRI later today and she is claustrophobic.

## 2019-01-13 NOTE — TOC Progression Note (Signed)
Transition of Care [facility]) - Progression Note    Patient Details  Name: Emily Thomas MRN: [id number] Date of Birth: 1960-02-06  [facility] CM/SW Contact  Ross Ludwig, [place] Phone Number: 01/13/2019, 5:42 PM  Clinical Narrative:    CSW continuing to follow patient's progress throughout discharge planning.        Expected Discharge Plan and Services                                                 Social Determinants of Health (SDOH) Interventions    Readmission Risk Interventions No flowsheet data found.

## 2019-01-13 NOTE — Progress Notes (Addendum)
Hypoglycemic Event  CBG: 57  Treatment: 4 oz of juice  Symptoms: Confused  Follow-up CBG: Time:2348 CBG Result:132  Possible Reasons for Event: 5 units of Novolog given at 1939 for BG 242   Comments/MD notified:    [facility]

## 2019-01-14 DIAGNOSIS — C7989 Secondary malignant neoplasm of other specified sites: Secondary | ICD-10-CM

## 2019-01-14 LAB — COMPREHENSIVE METABOLIC PANEL
ALT: 82 U/L — ABNORMAL HIGH (ref 0–44)
AST: 125 U/L — ABNORMAL HIGH (ref 15–41)
Albumin: 2.2 g/dL — ABNORMAL LOW (ref 3.5–5.0)
Alkaline Phosphatase: 332 U/L — ABNORMAL HIGH (ref 38–126)
Anion gap: 11 (ref 5–15)
BUN: 27 mg/dL — ABNORMAL HIGH (ref 6–20)
CO2: 36 mmol/L — ABNORMAL HIGH (ref 22–32)
Calcium: 8.2 mg/dL — ABNORMAL LOW (ref 8.9–10.3)
Chloride: 95 mmol/L — ABNORMAL LOW (ref 98–111)
Creatinine, Ser: 0.86 mg/dL (ref 0.44–1.00)
GFR calc Af Amer: >60 mL/min (ref >60)
GFR calc non Af Amer: >60 mL/min (ref >60)
Glucose, Bld: 97 mg/dL (ref 70–99)
Potassium: 3.7 mmol/L (ref 3.5–5.1)
Sodium: 142 mmol/L (ref 135–145)
Total Bilirubin: 0.4 mg/dL (ref 0.3–1.2)
Total Protein: 5.5 g/dL — ABNORMAL LOW (ref 6.5–8.1)

## 2019-01-14 LAB — HEPATITIS PANEL, ACUTE
HCV Ab: NONREACTIVE
Hep A IgM: NONREACTIVE
Hep B C IgM: NONREACTIVE
Hepatitis B Surface Ag: NONREACTIVE

## 2019-01-14 LAB — GLUCOSE, CAPILLARY
Glucose-Capillary: 116 mg/dL — ABNORMAL HIGH (ref 70–99)
Glucose-Capillary: 187 mg/dL — ABNORMAL HIGH (ref 70–99)
Glucose-Capillary: 232 mg/dL — ABNORMAL HIGH (ref 70–99)
Glucose-Capillary: 87 mg/dL (ref 70–99)
Glucose-Capillary: 90 mg/dL (ref 70–99)
Glucose-Capillary: 96 mg/dL (ref 70–99)

## 2019-01-14 LAB — CBC WITH DIFFERENTIAL/PLATELET
Abs Immature Granulocytes: 1.15 10*3/uL — ABNORMAL HIGH (ref 0.00–0.07)
Basophils Absolute: 0.1 10*3/uL (ref 0.0–0.1)
Basophils Relative: 1 %
Eosinophils Absolute: 0.1 10*3/uL (ref 0.0–0.5)
Eosinophils Relative: 0 %
HCT: 32.6 % — ABNORMAL LOW (ref 36.0–46.0)
Hemoglobin: 9.6 g/dL — ABNORMAL LOW (ref 12.0–15.0)
Immature Granulocytes: 10 %
Lymphocytes Relative: 10 %
Lymphs Abs: 1.2 10*3/uL (ref 0.7–4.0)
MCH: 25.8 pg — ABNORMAL LOW (ref 26.0–34.0)
MCHC: 29.4 g/dL — ABNORMAL LOW (ref 30.0–36.0)
MCV: 87.6 fL (ref 80.0–100.0)
Monocytes Absolute: 0.7 10*3/uL (ref 0.1–1.0)
Monocytes Relative: 6 %
Neutro Abs: 8.2 10*3/uL — ABNORMAL HIGH (ref 1.7–7.7)
Neutrophils Relative %: 73 %
Platelets: 130 10*3/uL — ABNORMAL LOW (ref 150–400)
RBC: 3.72 MIL/uL — ABNORMAL LOW (ref 3.87–5.11)
RDW: 16.8 % — ABNORMAL HIGH (ref 11.5–15.5)
WBC: 11.4 10*3/uL — ABNORMAL HIGH (ref 4.0–10.5)
nRBC: 0.9 % — ABNORMAL HIGH (ref 0.0–0.2)

## 2019-01-14 LAB — MAGNESIUM: Magnesium: 2 mg/dL (ref 1.7–2.4)

## 2019-01-14 LAB — PHOSPHORUS: Phosphorus: 3.4 mg/dL (ref 2.5–4.6)

## 2019-01-14 LAB — CK: Total CK: 203 U/L (ref 38–234)

## 2019-01-14 MED ORDER — HYDROMORPHONE HCL 1 MG/ML IJ SOLN
1.0000 mg | INTRAMUSCULAR | Status: DC | PRN
  Administered 2019-01-15: 1 mg via INTRAVENOUS
  Filled 2019-01-14: qty 1

## 2019-01-14 NOTE — Progress Notes (Signed)
Physical Therapy Treatment Patient Details Name: SAMADHI MAHURIN MRN: [id number] DOB: September 18, 1960 Today's Date: 01/14/2019    History of Present Illness 59 y.o. female  with PMH including degenerative disc disease, chronic back pain, DM type 2, HTN, PE no longer on anticoagulation, CAD, combined CHF admitted on 12/30/2018 with shortness of breath. Patient transferred from [facility] for Covid pneumonia and acute respiratory failure with hypoxia. Patient's course complicated by acute pancreatitis. ECHO 85/88 revealed systolic/diastolic heart failure with EF 35-40%. Abdominal CT revealed liver and lung mass, suspicious for RLL metastatic lung cancer and sclerotic lesions. CT head suspicious for metastases in left occipital lobe.     PT Comments    Pt initially declined participation at this time. She eventually agreed to ambulate to/from bathroom. Pt is unsteady and will need to use a RW for safe ambulation. O2 87% on RA during session. Replaced [place] O2 at end of session.     Follow Up Recommendations  Home health PT;Supervision/Assistance - 24 hour     Equipment Recommendations  Rolling walker with 5" wheels    Recommendations for Other Services OT consult     Precautions / Restrictions Precautions Precautions: Fall Restrictions Weight Bearing Restrictions: No    Mobility  Bed Mobility Overal bed mobility: Needs Assistance Bed Mobility: Supine to Sit;Sit to Supine     Supine to sit: Supervision;HOB elevated Sit to supine: Supervision;HOB elevated      Transfers Overall transfer level: Needs assistance Equipment used: None Transfers: Sit to/from Stand Sit to Stand: Min assist         General transfer comment: Assist to steady. Fall risk.  Ambulation/Gait Ambulation/Gait assistance: Min assist Gait Distance (Feet): 15 Feet(x2) Assistive device: None Gait Pattern/deviations: Step-through pattern;Decreased stride length;Drifts right/left     General Gait  Details: Pt agreeable to walking to bathroom. She is very unsteady without use of a RW-she is agreeable to using one. O2 87% on RA with short walk to and from bathroom.   Stairs             Wheelchair Mobility    Modified Rankin (Stroke Patients Only)       Balance Overall balance assessment: Needs assistance         Standing balance support: No upper extremity supported Standing balance-Leahy Scale: Poor                              Cognition Arousal/Alertness: Awake/alert Behavior During Therapy: Flat affect Overall Cognitive Status: No family/caregiver present to determine baseline cognitive functioning                                        Exercises      General Comments        Pertinent Vitals/Pain Pain Assessment: Faces Faces Pain Scale: Hurts little more Pain Location: shoulder Pain Descriptors / Indicators: Discomfort;Sore Pain Intervention(s): Monitored during session;Repositioned    Home Living                      Prior Function            PT Goals (current goals can now be found in the care plan section) Progress towards PT goals: Progressing toward goals    Frequency    Min 3X/week      PT Plan Current plan remains  appropriate    Co-evaluation              AM-PAC PT "6 Clicks" Mobility   Outcome Measure  Help needed turning from your back to your side while in a flat bed without using bedrails?: None Help needed moving from lying on your back to sitting on the side of a flat bed without using bedrails?: None Help needed moving to and from a bed to a chair (including a wheelchair)?: A Little Help needed standing up from a chair using your arms (e.g., wheelchair or bedside chair)?: A Little Help needed to walk in hospital room?: A Little Help needed climbing 3-5 steps with a railing? : A Little 6 Click Score: 20    End of Session   Activity Tolerance: Patient limited by  fatigue Patient left: in bed;with call bell/phone within reach;with bed alarm set   PT Visit Diagnosis: History of falling (Z91.81);Unsteadiness on feet (R26.81);Muscle weakness (generalized) (M62.81)     Time: 2158-7276 PT Time Calculation (min) (ACUTE ONLY): 24 min  Charges:  $Gait Training: 8-22 mins $Therapeutic Activity: 8-22 mins                         Doreatha Massed, PT Acute Rehabilitation

## 2019-01-14 NOTE — Progress Notes (Signed)
OT Cancellation Note  Patient Details Name: Emily Thomas MRN: [id number] DOB: 14-Jun-1960   Cancelled Treatment:    Reason Eval/Treat Not Completed: Pain limiting ability to participate  [place] 01/14/2019, 9:37 AM  Karsten Ro, OTR/L Acute Rehabilitation Services 01/14/2019

## 2019-01-14 NOTE — Progress Notes (Signed)
PROGRESS NOTE  Emily Thomas:[id number] DOB: 16-Jul-1960   PCP: Patient, No Pcp Per  Patient is from: Home.  DOA: 12/30/2018 LOS: 18  Brief Narrative / Interim history: 59 year old female with history of combined CHF, CAD, DM-2, tobacco use disorder, DDD/chronic back pain,  remote PE not on anticoagulation, gastric ulcer/GERD and hiatal hernia presented to [place] with fatigue, myalgia and shortness of breath since he had bowel prep for EGD/colonoscopy mid December.  Diagnosed with acute respiratory failure with hypoxia due to COVID-19 pneumonia and transfer to [facility] for further care.  Upon further evaluation, found to have RLL lung cancer/mass concerning for metastasis to liver, intrathoracic lymph nodes, adrenal glands, skull, cervical and thoracic spines. Case discussed with IR, Dr. Earleen Newport who was suggested transfer to [facility] for oncology input and possible liver biopsy if patient wishes to pursue treatment.  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   Patient completed 5 days of remdesivir course on 01/03/2019.   Subjective: Reportedly had severe pain overnight.  Overnight attending increase Dilaudid to 1.5 mg every 4 hours.  This morning, she reports some left-sided shoulder pain.  Abdominal pain improved.  Denies chest pain, dyspnea, nausea, vomiting or UTI symptoms.  Objective: Vitals:   01/13/19 2100 01/14/19 0545 01/14/19 1321 01/14/19 1322  BP: 113/73 124/74 92/63   Pulse: 61 61 83   Resp: 12 15 20    Temp: 97.6 F (36.4 C) 98.3 F (36.8 C)  97.7 F (36.5 C)  TempSrc: Oral Oral  Axillary  SpO2: 95% 93% 92%   Weight:      Height:        Intake/Output Summary (Last 24 hours) at 01/14/2019 1601 Last data filed at 01/14/2019 1317 Gross per 24 hour  Intake 1044.34 ml  Output 0 ml  Net 1044.34 ml   Filed  Weights   01/08/19 0451 01/09/19 2000 01/10/19 0500  Weight: 66.6 kg 60 kg 60 kg    Examination:  GENERAL: No acute distress.  Appears well.  HEENT: MMM.  Vision and hearing grossly intact.  NECK: Supple.  No apparent JVD.  RESP: 98% on room air.  No IWOB.  Diminished aeration on the right.  Rhonchi on the left. CVS:  RRR. Heart sounds normal.  ABD/GI/GU: Bowel sounds present. Soft. Non tender.  MSK/EXT:  Moves extremities. No apparent deformity. No edema.  FROM in left shoulder.  No focal tenderness. SKIN: no apparent skin lesion or wound NEURO: Awake, alert and oriented appropriately.  No apparent focal neuro deficit. PSYCH: Calm. Normal affect.  Procedures:  None  Assessment & Plan: Right lower lobe lung cancer/mass concerning for metastasis to liver, intrathoracic lymph nodes, adrenal glands, cervical and thoracic spines, and skull -Plan for US-guided liver biopsy this week after Plavix washout.  Last dose of Plavix 1/9. -Discussed with oncology, Dr. Hayden Rasmussen will see patient the week of 1/11  Acute respiratory failure with hypoxia due to COVID-19 pneumonia: Good saturation on room air today. -Completed 5 days of remdesivir on 01/03/2019 -Stop Decadron. -Supportive care with inhalers, mucolytic's/antitussive and incentive spirometry. -OOB/PT/OT  Chronic combined CHF: Echo on 12/31 with LVEF of 35-40%, G1-DD and global hypokinesis but no other major finding.  Appears euvolemic on exam.  I&O incomplete.  Weight downtrending.  Renal function stable. -Switch to torsemide 40 mg daily -GDMT-lisinopril and metoprolol.  Added Aldactone. -Monitor fluid  status and renal function -Monitor and replenish electrolytes aggressively.  Mildly elevated lipase/abdominal pain: No evidence of pancreatitis on imaging.  Improved with IV Dilaudid. -Pain control -Monitor lipase.  Acute on chronic pain/DDD in the setting of cancer with possible mets to cervical and thoracic spine -P.o.  morphine 15 mg every 6 hours, fentanyl patch and Lidoderm patch -Wean IV Dilaudid to 1 mg every 4 hours-will be cautious with pain meds given history of "pain medication seeking behavior" -Bowel regimen.  Uncontrolled DM-2 with hyperglycemia: A1c 7.2% on 12/28. Recent Labs    01/14/19 0423 01/14/19 0731 01/14/19 1138  GLUCAP 96 90 116*  -Continue Lantus 5 units daily, SSI-moderate and Tradjenta -Continue Crestor -CBG monitoring  Transaminitis/elevated alkaline phosphatase: Slightly up trended.  Could be due to COVID-19 or malignancy.  CK within normal.  Acute hepatitis panel negative. -Continue trending -RUQ [place] if worse.  COPD exacerbation: Likely due to COVID-19 infection.  Stable now -Steroid and breathing treatments as above -Encourage smoking cessation. -Wean oxygen as able.  History of CAD s/p PCI: Stable no anginal symptoms. -Lisinopril and metoprolol as above -Continue aspirin.  Plavix on hold for planned procedure. -Continue statin.  Essential hypertension: Normotensive. -Cardiac meds as above  Remote history of PE: No longer on anticoagulation.  She is high risk for VTE given malignancy -Lovenox to subcu heparin for VTE prophylaxis pending biopsy.  Anxiety/insomnia:  -Scheduled Klonopin 1 mg twice daily instead of as needed Ativan -Added BuSpar 5 mg 3 times daily. -Continue home Cymbalta.  Tobacco use disorder -Encourage cessation -Offer nicotine patch.  Hypokalemia/hypomagnesemia/hypophosphatemia: Hypokalemia and hypomagnesemia resolved. -Sodium phosphate for hypophosphatemia.  History of PUD/GERD: -Continue Protonix and Carafate.  Goal of care discussion: Patient with significant cardiopulmonary comorbidities as above.  Now with right lung mass concerning for metastatic lung cancer to multiple organs.  Patient wishes to pursue biopsy and aggressive treatment.  She wishes to remain full code.  Palliative care involved. -Follow palliative recommendations       Nutrition Problem: Increased nutrient needs Etiology: catabolic NIOEVOJ(JKKXF-81; new metastatic cancer)  Signs/Symptoms: estimated needs  Interventions: Ensure Enlive (each supplement provides 350kcal and 20 grams of protein), Magic cup, Hormel Shake   DVT prophylaxis: Subcu heparin Code Status: Full code-confirmed with patient's. Family Communication: Updated patient's daughter-in-law, Elmo Putt over the phone. Disposition Plan: Remains inpatient for planned liver biopsy after Plavix washout.  Also continues to require IV Dilaudid in addition to p.o. morphine for adequate pain control.  Final disposition Home with home health and DME. Consultants: Pulm (off), IR, oncology   Microbiology summarized: -[id number] positive at [facility].  Sch Meds:  Scheduled Meds: . vitamin C  500 mg Oral Daily  . aspirin  81 mg Oral Daily  . busPIRone  5 mg Oral TID  . clonazePAM  1 mg Oral BID  . dexamethasone  2 mg Oral Daily  . DULoxetine  30 mg Oral Daily  . famotidine  20 mg Oral Daily  . feeding supplement (ENSURE ENLIVE)  237 mL Oral BID BM  . fentaNYL  1 patch Transdermal Q72H  . insulin aspart  0-15 Units Subcutaneous Q4H  . insulin glargine  5 Units Subcutaneous Daily  . Ipratropium-Albuterol  1 puff Inhalation TID  . lisinopril  10 mg Oral Daily  . metoprolol succinate  50 mg Oral Daily  . pantoprazole  40 mg Oral BID  . polyethylene glycol  17 g Oral Daily  . senna-docusate  2 tablet Oral QHS  . sodium chloride flush  3 mL Intravenous Q12H  . spironolactone  25 mg Oral Daily  . sucralfate  1 g Oral TID AC & HS  . torsemide  40 mg Oral Daily  . zinc sulfate  220 mg Oral Daily   Continuous Infusions: . sodium chloride     PRN Meds:.sodium chloride, acetaminophen, albuterol, alum & mag hydroxide-simeth **AND** lidocaine, antiseptic oral rinse, bisacodyl, chlorpheniramine-HYDROcodone, guaiFENesin-dextromethorphan, HYDROmorphone (DILAUDID) injection, morphine, nitroGLYCERIN,  ondansetron **OR** ondansetron (ZOFRAN) IV, sodium chloride, sodium chloride flush  Antimicrobials: Anti-infectives (From admission, onward)   Start     Dose/Rate Route Frequency Ordered Stop   01/03/19 0800  remdesivir 100 mg in sodium chloride 0.9 % 100 mL IVPB     100 mg 200 mL/hr over 30 Minutes Intravenous  Once 01/02/19 1737 01/03/19 0910   12/31/18 1000  remdesivir 100 mg in sodium chloride 0.9 % 100 mL IVPB  Status:  Discontinued     100 mg 200 mL/hr over 30 Minutes Intravenous Daily 12/30/18 1049 12/30/18 1432   12/31/18 1000  remdesivir 100 mg in sodium chloride 0.9 % 100 mL IVPB     100 mg 200 mL/hr over 30 Minutes Intravenous Daily 12/30/18 1432 01/02/19 1208   12/30/18 1500  azithromycin (ZITHROMAX) 500 mg in sodium chloride 0.9 % 250 mL IVPB     500 mg 250 mL/hr over 60 Minutes Intravenous Every 24 hours 12/30/18 1351 01/01/19 1900   12/30/18 1415  cefTRIAXone (ROCEPHIN) 1 g in sodium chloride 0.9 % 100 mL IVPB     1 g 200 mL/hr over 30 Minutes Intravenous Every 24 hours 12/30/18 1401 01/03/19 1358   12/30/18 1400  cefTRIAXone (ROCEPHIN) injection 1 g  Status:  Discontinued     1 g Intramuscular Every 24 hours 12/30/18 1351 12/30/18 1400   12/30/18 1200  remdesivir 200 mg in sodium chloride 0.9% 250 mL IVPB     200 mg 580 mL/hr over 30 Minutes Intravenous Once 12/30/18 1049 12/30/18 1306       I have personally reviewed the following labs and images: CBC: Recent Labs  Lab 01/08/19 0140 01/10/19 0031 01/12/19 0515 01/13/19 0237 01/14/19 0532  WBC 11.4* 11.4* 10.3 11.2* 11.4*  NEUTROABS 9.2* 8.1* 8.1* 8.5* 8.2*  HGB 10.9* 10.0* 9.7* 9.6* 9.6*  HCT 35.7* 33.8* 32.5* 32.7* 32.6*  MCV 83.2 85.1 86.4 87.0 87.6  PLT 198 158 126* 136* 130*   BMP &GFR Recent Labs  Lab 01/09/19 0320 01/09/19 0500 01/10/19 0031 01/12/19 0515 01/13/19 0237 01/14/19 0532  NA  --  140 141 140 142 142  K  --  3.0* 3.1* 2.6* 4.8 3.7  CL  --  94* 94* 93* 99 95*  CO2  --  34* 33*  36* 34* 36*  GLUCOSE  --  118* 164* 100* 105* 97  BUN  --  24* 22* 24* 27* 27*  CREATININE  --  0.78 0.70 0.71 0.75 0.86  CALCIUM  --  7.9* 7.9* 8.0* 8.5* 8.2*  MG 2.2  --  1.8 1.8 2.2 2.0  PHOS 1.9*  --  2.4* 3.1 1.8* 3.4   Estimated Creatinine Clearance: 60 mL/min (by C-G formula based on SCr of 0.86 mg/dL). Liver & Pancreas: Recent Labs  Lab 01/09/19 0500 01/10/19 0031 01/12/19 0515 01/13/19 0237 01/14/19 0532  AST 82* 78* 85* 124* 125*  ALT 34 41 51* 72* 82*  ALKPHOS 272* 266* 282* 319* 332*  BILITOT 0.4 0.8 0.7 0.6 0.4  PROT 5.5* 5.7* 5.2* 5.4* 5.5*  ALBUMIN 2.0* 2.1* 2.0* 2.1* 2.2*   Recent Labs  Lab 01/08/19 0140 01/09/19 0320 01/12/19 0204 01/13/19 0237  LIPASE 89* 74* 104* 81*   No results for input(s): AMMONIA in the last 168 hours. Diabetic: No results for input(s): HGBA1C in the last 72 hours. Recent Labs  Lab 01/13/19 2309 01/13/19 2348 01/14/19 0423 01/14/19 0731 01/14/19 1138  GLUCAP 57* 132* 96 90 116*   Cardiac Enzymes: Recent Labs  Lab 01/14/19 0532  CKTOTAL 203   No results for input(s): PROBNP in the last 8760 hours. Coagulation Profile: Recent Labs  Lab 01/09/19 0500  INR 1.0   Thyroid Function Tests: No results for input(s): TSH, T4TOTAL, FREET4, T3FREE, THYROIDAB in the last 72 hours. Lipid Profile: No results for input(s): CHOL, HDL, LDLCALC, TRIG, CHOLHDL, LDLDIRECT in the last 72 hours. Anemia Panel: No results for input(s): VITAMINB12, FOLATE, FERRITIN, TIBC, IRON, RETICCTPCT in the last 72 hours. Urine analysis: No results found for: COLORURINE, APPEARANCEUR, LABSPEC, PHURINE, GLUCOSEU, HGBUR, BILIRUBINUR, KETONESUR, PROTEINUR, UROBILINOGEN, NITRITE, LEUKOCYTESUR Sepsis Labs: Invalid input(s): PROCALCITONIN, [place]  Microbiology: No results found for this or any previous visit (from the past 240 hour(s)).  Radiology Studies: No results found.  Harue Pribble T. Wyndham  If 7PM-7AM, please contact  night-coverage [email or web address] Password [facility] 01/14/2019, 4:01 PM

## 2019-01-14 NOTE — Progress Notes (Signed)
Nutrition Follow-up  RD working remotely.   DOCUMENTATION CODES:   Not applicable  INTERVENTION:  - continue Ensure Enlive BID.    NUTRITION DIAGNOSIS:   Increased nutrient needs related to catabolic LZJQBHA(LPFXT-02; new metastatic cancer) as evidenced by estimated needs. -ongoing  GOAL:   Patient will meet greater than or equal to 90% of their needs -minimally met on average  MONITOR:   PO intake, Supplement acceptance, Labs  ASSESSMENT:   Pt with PMH of chronic back pain, remote PE, tobacco abuse, HTN, uncontrolled DM who was admitted with hypoxia due to COVID-19 PNA during admission pt dx with new CHF on IV lasix, acute pancreatitis with down trending labs, and new metastatic cancer of unknown primary (suspect pulmonary with liver mass and brain mets noted).  Diet on 1/3 was Heart Healthy/Carb Modified and was downgraded to FLD yesterday at 1437. Per flow sheet documentation, patient most recently consumed the following at meals:  1/8- 25% dinner 1/9- 50% breakfast; 75% dinner 1/10- 75% dinner 1/11- 50% breakfast, 75% lunch, and 25% of dinner 1/12- 50% breakfast  She has accepted Ensure 25-50% of the time offered. Weight has been trending down since admission on 12/28.   Per notes: - RLL lung cancer/mass concerning for mets to liver, lymph nodes, adrenal glads, cervical and thoracic spine, and skull--plan for ultrasound-guided liver biopsy this week  - acute respiratory failure with hypoxia d/t COVID-19 PNA - chronic CHF--diuretic order adjusted  - acute on chronic pain with hx of DDD in the setting of cancer with possible bone/spine mets - uncontrolled DM with hyperglycemia--A1c on 12/28 was 7.2% - patient wishes to remain full code   Labs reviewed; CBG: 90 mg/dl, Cl: 95 mmol/l, BUN: 27 mg/dl, Ca: 8.2 mg/dl, Alk Phos elevated, LFTs elevated. Medications reviewed; 500 mg ascorbic acid/day, 20 mg oral pepcid/day, sliding scale novolog, 5 units lantus/day, 40 mg oral  protonix BID, 2 tablets senokot/day, 1 packet miralax/day, 30 mmol IV NaPhos x1 run 1/11, 25 mg aldactone/day, 1 g carafate TID, 220 mg zinc sulfate/day.     NUTRITION - FOCUSED PHYSICAL EXAM:  unable to complete for COVID+ patient.   Diet Order:   Diet Order            Diet full liquid Room service appropriate? Yes; Fluid consistency: Thin  Diet effective now              EDUCATION NEEDS:   No education needs have been identified at this time  Skin:  Skin Assessment: Reviewed RN Assessment  Last BM:  1/12  Height:   Ht Readings from Last 1 Encounters:  12/30/18 5' 1.97" (1.574 m)    Weight:   Wt Readings from Last 1 Encounters:  01/10/19 60 kg    Ideal Body Weight:  50 kg  BMI:  Body mass index is 24.22 kg/m.  Estimated Nutritional Needs:   Kcal:  4097-3532  Protein:  100-115 grams  Fluid:  >/= 2/day     Jarome Matin, MS, RD, LDN, [facility] Inpatient Clinical Dietitian Pager # [phone] After hours/weekend pager # [phone]

## 2019-01-15 ENCOUNTER — Inpatient Hospital Stay (HOSPITAL_COMMUNITY): Payer: Medicaid Other

## 2019-01-15 LAB — GLUCOSE, CAPILLARY
Glucose-Capillary: 104 mg/dL — ABNORMAL HIGH (ref 70–99)
Glucose-Capillary: 158 mg/dL — ABNORMAL HIGH (ref 70–99)
Glucose-Capillary: 159 mg/dL — ABNORMAL HIGH (ref 70–99)
Glucose-Capillary: 206 mg/dL — ABNORMAL HIGH (ref 70–99)
Glucose-Capillary: 76 mg/dL (ref 70–99)
Glucose-Capillary: 85 mg/dL (ref 70–99)

## 2019-01-15 LAB — COMPREHENSIVE METABOLIC PANEL
ALT: 83 U/L — ABNORMAL HIGH (ref 0–44)
AST: 140 U/L — ABNORMAL HIGH (ref 15–41)
Albumin: 2.1 g/dL — ABNORMAL LOW (ref 3.5–5.0)
Alkaline Phosphatase: 322 U/L — ABNORMAL HIGH (ref 38–126)
Anion gap: 12 (ref 5–15)
BUN: 28 mg/dL — ABNORMAL HIGH (ref 6–20)
CO2: 36 mmol/L — ABNORMAL HIGH (ref 22–32)
Calcium: 8.1 mg/dL — ABNORMAL LOW (ref 8.9–10.3)
Chloride: 96 mmol/L — ABNORMAL LOW (ref 98–111)
Creatinine, Ser: 0.89 mg/dL (ref 0.44–1.00)
GFR calc Af Amer: >60 mL/min (ref >60)
GFR calc non Af Amer: >60 mL/min (ref >60)
Glucose, Bld: 94 mg/dL (ref 70–99)
Potassium: 3.3 mmol/L — ABNORMAL LOW (ref 3.5–5.1)
Sodium: 144 mmol/L (ref 135–145)
Total Bilirubin: 0.6 mg/dL (ref 0.3–1.2)
Total Protein: 5.5 g/dL — ABNORMAL LOW (ref 6.5–8.1)

## 2019-01-15 LAB — CBC WITH DIFFERENTIAL/PLATELET
Abs Immature Granulocytes: 0.78 10*3/uL — ABNORMAL HIGH (ref 0.00–0.07)
Basophils Absolute: 0.1 10*3/uL (ref 0.0–0.1)
Basophils Relative: 1 %
Eosinophils Absolute: 0.1 10*3/uL (ref 0.0–0.5)
Eosinophils Relative: 1 %
HCT: 30.8 % — ABNORMAL LOW (ref 36.0–46.0)
Hemoglobin: 9.1 g/dL — ABNORMAL LOW (ref 12.0–15.0)
Immature Granulocytes: 9 %
Lymphocytes Relative: 11 %
Lymphs Abs: 1 10*3/uL (ref 0.7–4.0)
MCH: 25.9 pg — ABNORMAL LOW (ref 26.0–34.0)
MCHC: 29.5 g/dL — ABNORMAL LOW (ref 30.0–36.0)
MCV: 87.7 fL (ref 80.0–100.0)
Monocytes Absolute: 0.5 10*3/uL (ref 0.1–1.0)
Monocytes Relative: 5 %
Neutro Abs: 6.4 10*3/uL (ref 1.7–7.7)
Neutrophils Relative %: 73 %
Platelets: 111 10*3/uL — ABNORMAL LOW (ref 150–400)
RBC: 3.51 MIL/uL — ABNORMAL LOW (ref 3.87–5.11)
RDW: 17.2 % — ABNORMAL HIGH (ref 11.5–15.5)
WBC: 8.7 10*3/uL (ref 4.0–10.5)
nRBC: 1 % — ABNORMAL HIGH (ref 0.0–0.2)

## 2019-01-15 LAB — PHOSPHORUS: Phosphorus: 3.3 mg/dL (ref 2.5–4.6)

## 2019-01-15 LAB — MAGNESIUM: Magnesium: 2 mg/dL (ref 1.7–2.4)

## 2019-01-15 MED ORDER — POTASSIUM CHLORIDE CRYS ER 20 MEQ PO TBCR
40.0000 meq | EXTENDED_RELEASE_TABLET | ORAL | Status: AC
  Administered 2019-01-15 (×2): 40 meq via ORAL
  Filled 2019-01-15 (×2): qty 2

## 2019-01-15 MED ORDER — NICOTINE 21 MG/24HR TD PT24
21.0000 mg | MEDICATED_PATCH | Freq: Every day | TRANSDERMAL | Status: DC
  Administered 2019-01-15 – 2019-01-18 (×4): 21 mg via TRANSDERMAL
  Filled 2019-01-15 (×4): qty 1

## 2019-01-15 NOTE — Progress Notes (Addendum)
Occupational Therapy Treatment Patient Details Name: Emily Thomas MRN: [id number] DOB: [birth year] Today's Date: 01/15/2019    History of present illness 59 y.o. female  with PMH including degenerative disc disease, chronic back pain, DM type 2, HTN, PE no longer on anticoagulation, CAD, combined CHF admitted on 12/30/2018 with shortness of breath. Patient transferred from [facility] for Covid pneumonia and acute respiratory failure with hypoxia. Patient's course complicated by acute pancreatitis. ECHO 25/95 revealed systolic/diastolic heart failure with EF 35-40%. Abdominal CT revealed liver and lung mass, suspicious for RLL metastatic lung cancer and sclerotic lesions. CT head suspicious for metastases in left occipital lobe.    OT comments  Performed toileting and grooming only this session. Pt reports she had already washed up.  Feeling weak and hasn't been able to eat today due to test.  Follow Up Recommendations  Supervision/Assistance - 24 hour;likely won't qualify for follow up OT   Equipment Recommendations  3 in 1 bedside commode    Recommendations for Other Services      Precautions / Restrictions Precautions Precautions: Fall       Mobility Bed Mobility         Supine to sit: Supervision;HOB elevated Sit to supine: Supervision;HOB elevated      Transfers   Equipment used: None   Sit to Stand: Supervision Stand pivot transfers: Supervision       General transfer comment: pt steady with SPT to [facility]. Assist to watch lines    Balance                                           ADL either performed or assessed with clinical judgement   ADL       Grooming: Wash/dry hands;Wash/dry face;Sitting;Set up                   Toilet Transfer: Supervision/safety;Stand-pivot;BSC   Toileting- Clothing Manipulation and Hygiene: Set up;Supervision/safety;Sit to/from stand         General ADL Comments: pt got up to use bathroom and  performed grooming from EOB.  She states that she really wants to be able to eat. Did not brush teeth as she is NPO at this time.  Did rinse plate for her.     Vision       Perception     Praxis      Cognition Arousal/Alertness: Awake/alert Behavior During Therapy: Flat affect Overall Cognitive Status: No family/caregiver present to determine baseline cognitive functioning                                          Exercises     Shoulder Instructions       General Comments sats in 90s throughout session    Pertinent Vitals/ Pain       Faces Pain Scale: Hurts little more Pain Location: stomach Pain Descriptors / Indicators: Discomfort;Sore Pain Intervention(s): Limited activity within patient's tolerance;Monitored during session;Repositioned  Home Living                                          Prior Functioning/Environment  Frequency  Min 2X/week        Progress Toward Goals  OT Goals(current goals can now be found in the care plan section)  Progress towards OT goals: Progressing toward goals     Plan      Co-evaluation                 AM-PAC OT "6 Clicks" Daily Activity     Outcome Measure   Help from another person eating meals?: None Help from another person taking care of personal grooming?: A Little Help from another person toileting, which includes using toliet, bedpan, or urinal?: A Little Help from another person bathing (including washing, rinsing, drying)?: A Little Help from another person to put on and taking off regular upper body clothing?: A Little Help from another person to put on and taking off regular lower body clothing?: A Little 6 Click Score: 19    End of Session    OT Visit Diagnosis: Unsteadiness on feet (R26.81);Muscle weakness (generalized) (M62.81);Pain   Activity Tolerance Patient tolerated treatment well   Patient Left in bed;with call bell/phone within  reach;with bed alarm set   Nurse Communication          Time: 4193-7902 OT Time Calculation (min): 11 min  Charges: OT General Charges $OT Visit: 1 Visit OT Treatments $Self Care/Home Management : 8-22 mins  [place], OTR/L Acute Rehabilitation Services 01/15/2019   [place] 01/15/2019, 3:40 PM

## 2019-01-15 NOTE — Progress Notes (Signed)
PROGRESS NOTE  JOEANN STEPPE XTG:[id number] DOB: Feb 10, 1960   PCP: Patient, No Pcp Per  Patient is from: Home.  DOA: 12/30/2018 LOS: 51  Brief Narrative / Interim history: 59 year old female with history of combined CHF, CAD, DM-2, tobacco use disorder, DDD/chronic back pain,  remote PE not on anticoagulation, gastric ulcer/GERD and hiatal hernia presented to [facility] with fatigue, myalgia and shortness of breath since he had bowel prep for EGD/colonoscopy mid December.  Diagnosed with acute respiratory failure with hypoxia due to COVID-19 pneumonia and transfer to [facility] for further care.  Upon further evaluation, found to have RLL lung cancer/mass concerning for metastasis to liver, intrathoracic lymph nodes, adrenal glands, skull, cervical and thoracic spines. Case discussed with IR, Dr. Earleen Newport who was suggested transfer to [facility] for oncology input and possible liver biopsy if patient wishes to pursue treatment.  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   Patient completed 5 days of remdesivir course on 01/03/2019.   Subjective: No major events overnight of this morning.  Complains of abdominal pain.  Left shoulder pain improved.  Requesting nicotine patch.  Smokes about a pack a day but hasn't smoked in a month.  Objective: Vitals:   01/15/19 0458 01/15/19 0813 01/15/19 0816 01/15/19 1507  BP: 122/83   101/70  Pulse: 75   74  Resp: 15   17  Temp: 98.2 F (36.8 C)   98.7 F (37.1 C)  TempSrc: Oral   Oral  SpO2: 95% 93% 94% 98%  Weight:      Height:        Intake/Output Summary (Last 24 hours) at 01/15/2019 1530 Last data filed at 01/15/2019 1508 Gross per 24 hour  Intake 820 ml  Output 400 ml  Net 420 ml   Filed Weights   01/08/19 0451 01/09/19 2000 01/10/19 0500  Weight: 66.6 kg 60 kg 60 kg     Examination:  GENERAL: No apparent distress.  Nontoxic. HEENT: MMM.  Vision and hearing grossly intact.  NECK: Supple.  No apparent JVD.  RESP: 98% on 1 L.  Dropped to 89 on RA.  No IWOB.  Diminished aeration on the right and rhonchi on the left. CVS:  RRR. Heart sounds normal.  ABD/GI/GU: Bowel sounds present. Soft. Non tender.  MSK/EXT:  Moves extremities. No apparent deformity. No edema.  SKIN: no apparent skin lesion or wound NEURO: Awake, alert and oriented appropriately.  No apparent focal neuro deficit. PSYCH: Calm. Normal affect.  Procedures:  None  Assessment & Plan: Right lower lobe lung cancer/mass concerning for metastasis to liver, intrathoracic lymph nodes, adrenal glands, cervical and thoracic spines, and skull -Plan for US-guided liver biopsy after Plavix washout.  Last dose of Plavix 1/9.  Will clarify with IR. -Discussed with oncology, Dr. Hayden Rasmussen will see patient the week of 1/11  Acute respiratory failure with hypoxia due to COVID-19 pneumonia: Good saturation on room air today. -Completed 5 days of remdesivir on 01/03/2019 -Stop Decadron. -Supportive care with inhalers, mucolytic's/antitussive and incentive spirometry. -OOB/PT/OT  Chronic combined CHF: Echo on 12/31 with LVEF of 35-40%, G1-DD and global hypokinesis but no other major finding.  Appears euvolemic on exam.  I&O incomplete.  Weight downtrending.  Renal function stable. -Switch to torsemide 40 mg daily -GDMT-lisinopril and metoprolol.  Added Aldactone. -Monitor fluid status and renal function -Monitor and replenish electrolytes aggressively.  Mildly elevated lipase/abdominal pain: No evidence of pancreatitis on imaging.  Improved with IV Dilaudid. -Pain control -Monitor lipase.  Acute on chronic pain/DDD in the setting of cancer with possible mets to cervical and thoracic spine -P.o. morphine 15 mg every 6 hours, fentanyl patch and Lidoderm patch -Wean IV Dilaudid to 1 mg every 4  hours-will be cautious with pain meds given history of "pain medication seeking behavior" -Bowel regimen.  Uncontrolled DM-2 with hyperglycemia: A1c 7.2% on 12/28. Recent Labs    01/15/19 0456 01/15/19 0725 01/15/19 1157  GLUCAP 85 104* 159*  -Continue Lantus 5 units daily, SSI-moderate and Tradjenta -Continue Crestor -CBG monitoring  Transaminitis/elevated alkaline phosphatase: Slightly up trended.  Could be due to COVID-19 or malignancy.  CK within normal.  Acute hepatitis panel negative. -Continue trending -RUQ [place]   COPD exacerbation: Likely due to COVID-19 infection.  Stable now -Steroid and breathing treatments as above -Encourage smoking cessation. -Wean oxygen as able.  History of CAD s/p PCI: Stable no anginal symptoms. -Lisinopril and metoprolol as above -Continue aspirin.  Plavix on hold for planned procedure. -Continue statin.  Essential hypertension: Normotensive. -Cardiac meds as above  Remote history of PE: No longer on anticoagulation.  She is high risk for VTE given malignancy -Lovenox to subcu heparin for VTE prophylaxis pending biopsy.  Anxiety/insomnia:  -Scheduled Klonopin 1 mg twice daily instead of as needed Ativan -Added BuSpar 5 mg 3 times daily. -Continue home Cymbalta.  Tobacco use disorder: Smokes about a pack a day. -Encourage cessation -Nicotine patch  Hypokalemia/hypomagnesemia/hypophosphatemia: Hypokalemia and hypomagnesemia resolved. -Sodium phosphate for hypophosphatemia.  History of PUD/GERD: -Continue Protonix and Carafate.  Goal of care discussion: Patient with significant cardiopulmonary comorbidities as above.  Now with right lung mass concerning for metastatic lung cancer to multiple organs.  Patient wishes to pursue biopsy and aggressive treatment.  She wishes to remain full code.  Palliative care involved. -Follow palliative recommendations     Nutrition Problem: Increased nutrient needs Etiology: catabolic  GGYIRSW(NIOEV-03; new metastatic cancer)  Signs/Symptoms: estimated needs  Interventions: Ensure Enlive (each supplement provides 350kcal and 20 grams of protein), Magic cup, Hormel Shake   DVT prophylaxis: Subcu heparin Code Status: Full code-confirmed with patient's. Family Communication: Updated patient's daughter-in-law, Elmo Putt over the phone. Disposition Plan: Remains inpatient for planned liver biopsy after Plavix washout.  Also continues to require IV Dilaudid in addition to p.o. morphine for adequate pain control.  Final disposition Home with home health and DME. Consultants: Pulm (off), IR, oncology   Microbiology summarized: -[id number] positive at [facility].  Sch Meds:  Scheduled Meds: . vitamin C  500 mg Oral Daily  . aspirin  81 mg Oral Daily  . busPIRone  5 mg Oral TID  . clonazePAM  1 mg Oral BID  . dexamethasone  2 mg Oral Daily  . DULoxetine  30 mg Oral Daily  . famotidine  20 mg Oral Daily  . feeding supplement (ENSURE ENLIVE)  237 mL Oral BID BM  . fentaNYL  1 patch Transdermal Q72H  . insulin aspart  0-15 Units Subcutaneous Q4H  . insulin glargine  5 Units Subcutaneous Daily  . Ipratropium-Albuterol  1 puff Inhalation TID  . lisinopril  10 mg Oral Daily  . metoprolol succinate  50 mg Oral Daily  . pantoprazole  40 mg Oral BID  . polyethylene glycol  17 g Oral Daily  . potassium chloride  40 mEq Oral Q4H  . senna-docusate  2 tablet Oral QHS  . sodium chloride  flush  3 mL Intravenous Q12H  . spironolactone  25 mg Oral Daily  . sucralfate  1 g Oral TID AC & HS  . torsemide  40 mg Oral Daily  . zinc sulfate  220 mg Oral Daily   Continuous Infusions: . sodium chloride     PRN Meds:.sodium chloride, acetaminophen, albuterol, alum & mag hydroxide-simeth **AND** lidocaine, antiseptic oral rinse, bisacodyl, chlorpheniramine-HYDROcodone, guaiFENesin-dextromethorphan, HYDROmorphone (DILAUDID) injection, morphine, nitroGLYCERIN, ondansetron **OR** ondansetron  (ZOFRAN) IV, sodium chloride, sodium chloride flush  Antimicrobials: Anti-infectives (From admission, onward)   Start     Dose/Rate Route Frequency Ordered Stop   01/03/19 0800  remdesivir 100 mg in sodium chloride 0.9 % 100 mL IVPB     100 mg 200 mL/hr over 30 Minutes Intravenous  Once 01/02/19 1737 01/03/19 0910   12/31/18 1000  remdesivir 100 mg in sodium chloride 0.9 % 100 mL IVPB  Status:  Discontinued     100 mg 200 mL/hr over 30 Minutes Intravenous Daily 12/30/18 1049 12/30/18 1432   12/31/18 1000  remdesivir 100 mg in sodium chloride 0.9 % 100 mL IVPB     100 mg 200 mL/hr over 30 Minutes Intravenous Daily 12/30/18 1432 01/02/19 1208   12/30/18 1500  azithromycin (ZITHROMAX) 500 mg in sodium chloride 0.9 % 250 mL IVPB     500 mg 250 mL/hr over 60 Minutes Intravenous Every 24 hours 12/30/18 1351 01/01/19 1900   12/30/18 1415  cefTRIAXone (ROCEPHIN) 1 g in sodium chloride 0.9 % 100 mL IVPB     1 g 200 mL/hr over 30 Minutes Intravenous Every 24 hours 12/30/18 1401 01/03/19 1358   12/30/18 1400  cefTRIAXone (ROCEPHIN) injection 1 g  Status:  Discontinued     1 g Intramuscular Every 24 hours 12/30/18 1351 12/30/18 1400   12/30/18 1200  remdesivir 200 mg in sodium chloride 0.9% 250 mL IVPB     200 mg 580 mL/hr over 30 Minutes Intravenous Once 12/30/18 1049 12/30/18 1306       I have personally reviewed the following labs and images: CBC: Recent Labs  Lab 01/10/19 0031 01/12/19 0515 01/13/19 0237 01/14/19 0532 01/15/19 0441  WBC 11.4* 10.3 11.2* 11.4* 8.7  NEUTROABS 8.1* 8.1* 8.5* 8.2* 6.4  HGB 10.0* 9.7* 9.6* 9.6* 9.1*  HCT 33.8* 32.5* 32.7* 32.6* 30.8*  MCV 85.1 86.4 87.0 87.6 87.7  PLT 158 126* 136* 130* 111*   BMP &GFR Recent Labs  Lab 01/10/19 0031 01/12/19 0515 01/13/19 0237 01/14/19 0532 01/15/19 0441  NA 141 140 142 142 144  K 3.1* 2.6* 4.8 3.7 3.3*  CL 94* 93* 99 95* 96*  CO2 33* 36* 34* 36* 36*  GLUCOSE 164* 100* 105* 97 94  BUN 22* 24* 27* 27* 28*   CREATININE 0.70 0.71 0.75 0.86 0.89  CALCIUM 7.9* 8.0* 8.5* 8.2* 8.1*  MG 1.8 1.8 2.2 2.0 2.0  PHOS 2.4* 3.1 1.8* 3.4 3.3   Estimated Creatinine Clearance: 58 mL/min (by C-G formula based on SCr of 0.89 mg/dL). Liver & Pancreas: Recent Labs  Lab 01/10/19 0031 01/12/19 0515 01/13/19 0237 01/14/19 0532 01/15/19 0441  AST 78* 85* 124* 125* 140*  ALT 41 51* 72* 82* 83*  ALKPHOS 266* 282* 319* 332* 322*  BILITOT 0.8 0.7 0.6 0.4 0.6  PROT 5.7* 5.2* 5.4* 5.5* 5.5*  ALBUMIN 2.1* 2.0* 2.1* 2.2* 2.1*   Recent Labs  Lab 01/09/19 0320 01/12/19 0204 01/13/19 0237  LIPASE 74* 104* 81*   No results for input(s): AMMONIA  in the last 168 hours. Diabetic: No results for input(s): HGBA1C in the last 72 hours. Recent Labs  Lab 01/14/19 1916 01/14/19 2336 01/15/19 0456 01/15/19 0725 01/15/19 1157  GLUCAP 187* 87 85 104* 159*   Cardiac Enzymes: Recent Labs  Lab 01/14/19 0532  CKTOTAL 203   No results for input(s): PROBNP in the last 8760 hours. Coagulation Profile: Recent Labs  Lab 01/09/19 0500  INR 1.0   Thyroid Function Tests: No results for input(s): TSH, T4TOTAL, FREET4, T3FREE, THYROIDAB in the last 72 hours. Lipid Profile: No results for input(s): CHOL, HDL, LDLCALC, TRIG, CHOLHDL, LDLDIRECT in the last 72 hours. Anemia Panel: No results for input(s): VITAMINB12, FOLATE, FERRITIN, TIBC, IRON, RETICCTPCT in the last 72 hours. Urine analysis: No results found for: COLORURINE, APPEARANCEUR, LABSPEC, PHURINE, GLUCOSEU, HGBUR, BILIRUBINUR, KETONESUR, PROTEINUR, UROBILINOGEN, NITRITE, LEUKOCYTESUR Sepsis Labs: Invalid input(s): PROCALCITONIN, [place]  Microbiology: No results found for this or any previous visit (from the past 240 hour(s)).  Radiology Studies: No results found.  Charron Coultas T. [place]  If 7PM-7AM, please contact night-coverage [email or web address] Password [facility] 01/15/2019, 3:30 PM

## 2019-01-15 NOTE — Plan of Care (Signed)
Continue current POC 

## 2019-01-15 NOTE — Progress Notes (Signed)
PMT provider chart review. Discussed with Dr. Cyndia Skeeters via epic secure chat. Patient is doing fair. Pending biopsy and oncology consultation. Pain being managed by Fentanyl 133mcg/hr TD q72h, MSIR 15mg  PO q6h prn moderate pain, and Dilaudid 1mg  IV q4h prn severe pain. On bowel regimen. PMT will continue to follow peripherally. Patient would benefit from outpatient palliative referral once discharged for ongoing [place] discussion and symptom management.   NO CHARGE  Ihor Dow, [place], FNP-C Palliative Medicine Team  Phone: [phone] Fax: [phone]

## 2019-01-15 NOTE — Progress Notes (Signed)
Daughter called and was updated about mother's condition. She wanted to know results of the ultrasound that was completed today. Daughter also inquired about oncology and liver biopsy, I relayed that concern to MD.

## 2019-01-16 ENCOUNTER — Inpatient Hospital Stay (HOSPITAL_COMMUNITY): Payer: Medicaid Other

## 2019-01-16 ENCOUNTER — Other Ambulatory Visit: Payer: Self-pay | Admitting: Oncology

## 2019-01-16 DIAGNOSIS — C7800 Secondary malignant neoplasm of unspecified lung: Secondary | ICD-10-CM

## 2019-01-16 HISTORY — PX: IR US GUIDE BX ASP/DRAIN: IMG2392

## 2019-01-16 LAB — COMPREHENSIVE METABOLIC PANEL
ALT: 94 U/L — ABNORMAL HIGH (ref 0–44)
AST: 154 U/L — ABNORMAL HIGH (ref 15–41)
Albumin: 2.1 g/dL — ABNORMAL LOW (ref 3.5–5.0)
Alkaline Phosphatase: 348 U/L — ABNORMAL HIGH (ref 38–126)
Anion gap: 12 (ref 5–15)
BUN: 27 mg/dL — ABNORMAL HIGH (ref 6–20)
CO2: 38 mmol/L — ABNORMAL HIGH (ref 22–32)
Calcium: 8.2 mg/dL — ABNORMAL LOW (ref 8.9–10.3)
Chloride: 93 mmol/L — ABNORMAL LOW (ref 98–111)
Creatinine, Ser: 0.85 mg/dL (ref 0.44–1.00)
GFR calc Af Amer: >60 mL/min (ref >60)
GFR calc non Af Amer: >60 mL/min (ref >60)
Glucose, Bld: 158 mg/dL — ABNORMAL HIGH (ref 70–99)
Potassium: 3.2 mmol/L — ABNORMAL LOW (ref 3.5–5.1)
Sodium: 143 mmol/L (ref 135–145)
Total Bilirubin: 0.4 mg/dL (ref 0.3–1.2)
Total Protein: 5.4 g/dL — ABNORMAL LOW (ref 6.5–8.1)

## 2019-01-16 LAB — CBC WITH DIFFERENTIAL/PLATELET
Abs Immature Granulocytes: 0.58 10*3/uL — ABNORMAL HIGH (ref 0.00–0.07)
Basophils Absolute: 0.1 10*3/uL (ref 0.0–0.1)
Basophils Relative: 1 %
Eosinophils Absolute: 0 10*3/uL (ref 0.0–0.5)
Eosinophils Relative: 0 %
HCT: 29.1 % — ABNORMAL LOW (ref 36.0–46.0)
Hemoglobin: 8.5 g/dL — ABNORMAL LOW (ref 12.0–15.0)
Immature Granulocytes: 8 %
Lymphocytes Relative: 12 %
Lymphs Abs: 0.9 10*3/uL (ref 0.7–4.0)
MCH: 25.8 pg — ABNORMAL LOW (ref 26.0–34.0)
MCHC: 29.2 g/dL — ABNORMAL LOW (ref 30.0–36.0)
MCV: 88.4 fL (ref 80.0–100.0)
Monocytes Absolute: 0.4 10*3/uL (ref 0.1–1.0)
Monocytes Relative: 6 %
Neutro Abs: 5.5 10*3/uL (ref 1.7–7.7)
Neutrophils Relative %: 73 %
Platelets: 112 10*3/uL — ABNORMAL LOW (ref 150–400)
RBC: 3.29 MIL/uL — ABNORMAL LOW (ref 3.87–5.11)
RDW: 16.9 % — ABNORMAL HIGH (ref 11.5–15.5)
WBC: 7.4 10*3/uL (ref 4.0–10.5)
nRBC: 0.9 % — ABNORMAL HIGH (ref 0.0–0.2)

## 2019-01-16 LAB — MAGNESIUM: Magnesium: 2 mg/dL (ref 1.7–2.4)

## 2019-01-16 LAB — GLUCOSE, CAPILLARY
Glucose-Capillary: 99 mg/dL (ref 70–99)
Glucose-Capillary: 104 mg/dL — ABNORMAL HIGH (ref 70–99)
Glucose-Capillary: 93 mg/dL (ref 70–99)
Glucose-Capillary: 259 mg/dL — ABNORMAL HIGH (ref 70–99)
Glucose-Capillary: 110 mg/dL — ABNORMAL HIGH (ref 70–99)

## 2019-01-16 LAB — APTT: aPTT: 26 seconds (ref 24–36)

## 2019-01-16 LAB — PROTIME-INR
INR: 1.1 (ref 0.8–1.2)
Prothrombin Time: 13.6 seconds (ref 11.4–15.2)

## 2019-01-16 MED ORDER — POTASSIUM CHLORIDE 10 MEQ/100ML IV SOLN
10.0000 meq | INTRAVENOUS | Status: AC
  Administered 2019-01-16 (×4): 10 meq via INTRAVENOUS
  Filled 2019-01-16: qty 100

## 2019-01-16 MED ORDER — LIDOCAINE HCL 1 % IJ SOLN
INTRAMUSCULAR | Status: AC
  Filled 2019-01-16: qty 20

## 2019-01-16 MED ORDER — HYDROMORPHONE HCL 1 MG/ML IJ SOLN
0.5000 mg | INTRAMUSCULAR | Status: DC | PRN
  Administered 2019-01-16 – 2019-01-17 (×2): 0.5 mg via INTRAVENOUS
  Filled 2019-01-16 (×3): qty 0.5

## 2019-01-16 MED ORDER — FENTANYL CITRATE (PF) 100 MCG/2ML IJ SOLN
INTRAMUSCULAR | Status: AC
  Filled 2019-01-16: qty 2

## 2019-01-16 MED ORDER — MIDAZOLAM HCL 2 MG/2ML IJ SOLN
INTRAMUSCULAR | Status: AC | PRN
  Administered 2019-01-16 (×2): 1 mg via INTRAVENOUS

## 2019-01-16 MED ORDER — FENTANYL CITRATE (PF) 100 MCG/2ML IJ SOLN
INTRAMUSCULAR | Status: AC | PRN
  Administered 2019-01-16 (×2): 50 ug via INTRAVENOUS

## 2019-01-16 MED ORDER — GELATIN ABSORBABLE 12-7 MM EX MISC
CUTANEOUS | Status: AC
  Filled 2019-01-16: qty 1

## 2019-01-16 MED ORDER — SPIRONOLACTONE 25 MG PO TABS: 50.0000 mg | ORAL_TABLET | Freq: Every day | ORAL | Status: DC

## 2019-01-16 MED ORDER — MORPHINE SULFATE 15 MG PO TABS
30.0000 mg | ORAL_TABLET | Freq: Three times a day (TID) | ORAL | Status: DC
  Administered 2019-01-16 – 2019-01-18 (×6): 30 mg via ORAL
  Filled 2019-01-16 (×6): qty 2

## 2019-01-16 MED ORDER — MIDAZOLAM HCL 2 MG/2ML IJ SOLN
INTRAMUSCULAR | Status: AC
  Filled 2019-01-16: qty 4

## 2019-01-16 MED ORDER — LIDOCAINE HCL (PF) 1 % IJ SOLN
INTRAMUSCULAR | Status: AC | PRN
  Administered 2019-01-16: 10 mL

## 2019-01-16 NOTE — Progress Notes (Signed)
SATURATION QUALIFICATIONS: (This note is used to comply with regulatory documentation for home oxygen)  Patient Saturations on Room Air at Rest = 94-97  Patient Saturations on Room Air while Ambulating = [phone]  Patient Saturations on 2 Liters of oxygen while Ambulating = 90  Please briefly explain why patient needs home oxygen:patient oxygen levels drop significantly when she is ambulating.

## 2019-01-16 NOTE — Progress Notes (Signed)
PT Cancellation Note  Patient Details Name: Emily Thomas MRN: [id number] DOB: 03/06/1960   Cancelled Treatment:    Reason Eval/Treat Not Completed: Medical issues which prohibited therapy(pt on 3 hours of bedrest following liver biopsy. Will follow.)  Philomena Doheny PT 01/16/2019  Acute Rehabilitation Services Pager [phone] Office [phone]

## 2019-01-16 NOTE — Progress Notes (Signed)
PROGRESS NOTE  Emily Thomas XBM:[id number] DOB: February 04, 1960   PCP: Patient, No Pcp Per  Patient is from: Home.  DOA: 12/30/2018 LOS: 49  Brief Narrative / Interim history: 59 year old female with history of combined CHF, CAD, DM-2, tobacco use disorder, DDD/chronic back pain,  remote PE not on anticoagulation, gastric ulcer/GERD and hiatal hernia presented to [facility] with fatigue, myalgia and shortness of breath since he had bowel prep for EGD/colonoscopy mid December.  Diagnosed with acute respiratory failure with hypoxia due to COVID-19 pneumonia and transfer to [facility] for further care.  Upon further evaluation, found to have RLL lung cancer/mass concerning for metastasis to liver, intrathoracic lymph nodes, adrenal glands, skull, cervical and thoracic spines. Case discussed with IR, Dr. Earleen Newport who was suggested transfer to [facility] for oncology input and possible liver biopsy if patient wishes to pursue treatment.  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   Patient completed 5 days of remdesivir course on 01/03/2019.   Subjective: No major events overnight of this morning.  Continues to endorse significant abdominal pain that she rates 8/10.  Denies chest pain and dyspnea.  Admits to intermittent cough.  Objective: Vitals:   01/16/19 1200 01/16/19 1235 01/16/19 1257 01/16/19 1327  BP: 112/76 113/71 128/73 (!) 123/99  Pulse: 88 79 81 86  Resp: 14 14 14 14   Temp: 97.7 F (36.5 C) 97.8 F (36.6 C) 97.8 F (36.6 C) 98.3 F (36.8 C)  TempSrc: Oral Oral Oral Oral  SpO2: 98% 96% 96% 94%  Weight:      Height:        Intake/Output Summary (Last 24 hours) at 01/16/2019 1612 Last data filed at 01/16/2019 1400 Gross per 24 hour  Intake 244.74 ml  Output 1650 ml  Net -1405.26 ml   Filed Weights   01/08/19 0451 01/09/19 2000  01/10/19 0500  Weight: 66.6 kg 60 kg 60 kg    Examination: GENERAL: No acute distress.  Appears well.  HEENT: MMM.  Vision and hearing grossly intact.  NECK: Supple.  No apparent JVD.  RESP: 93% on RA during my exam.  No IWOB.  Diminished aeration on the right and rhonchi on the left. CVS:  RRR. Heart sounds normal.  ABD/GI/GU: Bowel sounds present. Soft.  No significant tenderness on palpation. MSK/EXT:  Moves extremities. No apparent deformity. No edema.  SKIN: no apparent skin lesion or wound NEURO: Awake, alert and oriented appropriately.  No apparent focal neuro deficit. PSYCH: Calm. Normal affect.  Procedures:  1/14-liver biopsy by IR  Assessment & Plan: Right lower lobe lung cancer/mass concerning for metastasis to liver, intrathoracic lymph nodes, adrenal glands, cervical and thoracic spines, and skull -US guided liver biopsy on 1/14.  Pathology pending. -Oncology to arrange outpatient follow-up after pathology.  Acute respiratory failure with hypoxia due to COVID-19 pneumonia: Good saturation on room air today. -Completed 5 days of remdesivir on 01/03/2019 -Stop Decadron. -Supportive care with inhalers, mucolytic's/antitussive and incentive spirometry. -OOB/PT/OT  Chronic combined CHF: Echo on 12/31 with LVEF of 35-40%, G1-DD and global hypokinesis but no other major finding.  Appears euvolemic on exam.  I&O incomplete.  Weight downtrending.  Renal function stable. -Torsemide 40 mg daily -GDMT-lisinopril and metoprolol.  Added Aldactone. -Monitor fluid status and renal function -Monitor and replenish electrolytes aggressively.  Mildly elevated lipase/abdominal pain: No evidence of pancreatitis on imaging.  Improved with IV Dilaudid. -Pain control -Monitor lipase.  Acute on chronic pain/cancer pain/abdominal pain/DDD:  -Schedule oral morphine 30 mg every 8 hours-with plan to discharge on morphine ER 30 mg twice daily and oxycodone 10 mg q8h as needed severe  pain -Decrease IV Dilaudid to 0.5 mg every 4 hours -Discontinue fentanyl patch -Bowel regimen.  Uncontrolled DM-2 with hyperglycemia: A1c 7.2% on 12/28. Recent Labs    01/15/19 2344 01/16/19 0335 01/16/19 0730  GLUCAP 76 99 104*  -Continue Lantus 5 units daily, SSI-moderate and Tradjenta -Continue Crestor -CBG monitoring  Transaminitis/elevated alkaline phosphatase: Slightly up trended.  Could be due to COVID-19 or malignancy.  CK within normal.  Acute hepatitis panel negative.  RUQ ultrasound with known liver masses but no biliary issue. -Continue trending  COPD exacerbation: Likely due to COVID-19 infection.  Stable now -Breathing treatments as above -Encourage smoking cessation.  History of CAD s/p PCI: Stable no anginal symptoms. -Lisinopril and metoprolol as above -Continue aspirin.  Plavix on hold for planned procedure. -Continue statin.  Essential hypertension: Normotensive. -Cardiac meds as above  Remote history of PE: No longer on anticoagulation.  She is high risk for VTE given malignancy -Lovenox to subcu heparin for VTE prophylaxis pending biopsy.  Anxiety/insomnia:  -Scheduled Klonopin 1 mg twice daily instead of as needed Ativan -Added BuSpar 5 mg 3 times daily. -Continue home Cymbalta.  Tobacco use disorder: Smokes about a pack a day. -Encourage cessation -Nicotine patch  Hypokalemia/hypomagnesemia/hypophosphatemia: Hypokalemia and hypomagnesemia resolved. -Replenish and recheck -Increase Aldactone to 50 mg daily.  History of PUD/GERD: -Continue Protonix and Carafate.  Goal of care discussion: Patient with significant cardiopulmonary comorbidities as above.  Now with right lung mass concerning for metastatic lung cancer to multiple organs.  Patient wishes to pursue biopsy and aggressive treatment.  She wishes to remain full code.  Palliative care involved. -We will place palliative consult on discharge.     Nutrition Problem: Increased nutrient  needs Etiology: catabolic QIONGEX(BMWUX-32; new metastatic cancer)  Signs/Symptoms: estimated needs  Interventions: Ensure Enlive (each supplement provides 350kcal and 20 grams of protein), Magic cup, Hormel Shake   DVT prophylaxis: Subcu heparin Code Status: Full code-confirmed with patient's. Family Communication: Updated patient's daughter-in-law, Elmo Putt over the phone. Disposition Plan: Anticipate discharge home on 1/15 once pain fairly controlled, electrolytes stable Consultants: Pulm (off), IR, oncology   Microbiology summarized: -[id number] positive at [facility].  Sch Meds:  Scheduled Meds: . vitamin C  500 mg Oral Daily  . busPIRone  5 mg Oral TID  . clonazePAM  1 mg Oral BID  . dexamethasone  2 mg Oral Daily  . DULoxetine  30 mg Oral Daily  . famotidine  20 mg Oral Daily  . feeding supplement (ENSURE ENLIVE)  237 mL Oral BID BM  . fentaNYL  1 patch Transdermal Q72H  . fentaNYL      . insulin aspart  0-15 Units Subcutaneous Q4H  . insulin glargine  5 Units Subcutaneous Daily  . Ipratropium-Albuterol  1 puff Inhalation TID  . lidocaine      . lidocaine      . lisinopril  10 mg Oral Daily  . metoprolol succinate  50 mg Oral Daily  . midazolam      . morphine  30 mg Oral Q8H  . nicotine  21 mg Transdermal Daily  . pantoprazole  40 mg Oral BID  . polyethylene glycol  17 g Oral Daily  . senna-docusate  2 tablet Oral QHS  . sodium chloride  flush  3 mL Intravenous Q12H  . [START ON 01/17/2019] spironolactone  50 mg Oral Daily  . sucralfate  1 g Oral TID AC & HS  . torsemide  40 mg Oral Daily  . zinc sulfate  220 mg Oral Daily   Continuous Infusions: . sodium chloride     PRN Meds:.sodium chloride, acetaminophen, albuterol, alum & mag hydroxide-simeth **AND** lidocaine, antiseptic oral rinse, bisacodyl, chlorpheniramine-HYDROcodone, guaiFENesin-dextromethorphan, HYDROmorphone (DILAUDID) injection, nitroGLYCERIN, ondansetron **OR** ondansetron (ZOFRAN) IV, sodium  chloride, sodium chloride flush  Antimicrobials: Anti-infectives (From admission, onward)   Start     Dose/Rate Route Frequency Ordered Stop   01/03/19 0800  remdesivir 100 mg in sodium chloride 0.9 % 100 mL IVPB     100 mg 200 mL/hr over 30 Minutes Intravenous  Once 01/02/19 1737 01/03/19 0910   12/31/18 1000  remdesivir 100 mg in sodium chloride 0.9 % 100 mL IVPB  Status:  Discontinued     100 mg 200 mL/hr over 30 Minutes Intravenous Daily 12/30/18 1049 12/30/18 1432   12/31/18 1000  remdesivir 100 mg in sodium chloride 0.9 % 100 mL IVPB     100 mg 200 mL/hr over 30 Minutes Intravenous Daily 12/30/18 1432 01/02/19 1208   12/30/18 1500  azithromycin (ZITHROMAX) 500 mg in sodium chloride 0.9 % 250 mL IVPB     500 mg 250 mL/hr over 60 Minutes Intravenous Every 24 hours 12/30/18 1351 01/01/19 1900   12/30/18 1415  cefTRIAXone (ROCEPHIN) 1 g in sodium chloride 0.9 % 100 mL IVPB     1 g 200 mL/hr over 30 Minutes Intravenous Every 24 hours 12/30/18 1401 01/03/19 1358   12/30/18 1400  cefTRIAXone (ROCEPHIN) injection 1 g  Status:  Discontinued     1 g Intramuscular Every 24 hours 12/30/18 1351 12/30/18 1400   12/30/18 1200  remdesivir 200 mg in sodium chloride 0.9% 250 mL IVPB     200 mg 580 mL/hr over 30 Minutes Intravenous Once 12/30/18 1049 12/30/18 1306       I have personally reviewed the following labs and images: CBC: Recent Labs  Lab 01/12/19 0515 01/13/19 0237 01/14/19 0532 01/15/19 0441 01/16/19 0145  WBC 10.3 11.2* 11.4* 8.7 7.4  NEUTROABS 8.1* 8.5* 8.2* 6.4 5.5  HGB 9.7* 9.6* 9.6* 9.1* 8.5*  HCT 32.5* 32.7* 32.6* 30.8* 29.1*  MCV 86.4 87.0 87.6 87.7 88.4  PLT 126* 136* 130* 111* 112*   BMP &GFR Recent Labs  Lab 01/10/19 0031 01/10/19 0031 01/12/19 0515 01/13/19 0237 01/14/19 0532 01/15/19 0441 01/16/19 0145  NA 141   < > 140 142 142 144 143  K 3.1*   < > 2.6* 4.8 3.7 3.3* 3.2*  CL 94*   < > 93* 99 95* 96* 93*  CO2 33*   < > 36* 34* 36* 36* 38*  GLUCOSE  164*   < > 100* 105* 97 94 158*  BUN 22*   < > 24* 27* 27* 28* 27*  CREATININE 0.70   < > 0.71 0.75 0.86 0.89 0.85  CALCIUM 7.9*   < > 8.0* 8.5* 8.2* 8.1* 8.2*  MG 1.8   < > 1.8 2.2 2.0 2.0 2.0  PHOS 2.4*  --  3.1 1.8* 3.4 3.3  --    < > = values in this interval not displayed.   Estimated Creatinine Clearance: 60.8 mL/min (by C-G formula based on SCr of 0.85 mg/dL). Liver & Pancreas: Recent Labs  Lab 01/12/19 0515 01/13/19 0160 01/14/19 0532 01/15/19 0441  01/16/19 0145  AST 85* 124* 125* 140* 154*  ALT 51* 72* 82* 83* 94*  ALKPHOS 282* 319* 332* 322* 348*  BILITOT 0.7 0.6 0.4 0.6 0.4  PROT 5.2* 5.4* 5.5* 5.5* 5.4*  ALBUMIN 2.0* 2.1* 2.2* 2.1* 2.1*   Recent Labs  Lab 01/12/19 0204 01/13/19 0237  LIPASE 104* 81*   No results for input(s): AMMONIA in the last 168 hours. Diabetic: No results for input(s): HGBA1C in the last 72 hours. Recent Labs  Lab 01/15/19 1544 01/15/19 2007 01/15/19 2344 01/16/19 0335 01/16/19 0730  GLUCAP 158* 206* 76 99 104*   Cardiac Enzymes: Recent Labs  Lab 01/14/19 0532  CKTOTAL 203   No results for input(s): PROBNP in the last 8760 hours. Coagulation Profile: Recent Labs  Lab 01/16/19 0145  INR 1.1   Thyroid Function Tests: No results for input(s): TSH, T4TOTAL, FREET4, T3FREE, THYROIDAB in the last 72 hours. Lipid Profile: No results for input(s): CHOL, HDL, LDLCALC, TRIG, CHOLHDL, LDLDIRECT in the last 72 hours. Anemia Panel: No results for input(s): VITAMINB12, FOLATE, FERRITIN, TIBC, IRON, RETICCTPCT in the last 72 hours. Urine analysis: No results found for: COLORURINE, APPEARANCEUR, LABSPEC, PHURINE, GLUCOSEU, HGBUR, BILIRUBINUR, KETONESUR, PROTEINUR, UROBILINOGEN, NITRITE, LEUKOCYTESUR Sepsis Labs: Invalid input(s): PROCALCITONIN, [place]  Microbiology: No results found for this or any previous visit (from the past 240 hour(s)).  Radiology Studies: IR US Guide Bx Asp/Drain  Result Date: 01/16/2019 INDICATION:  59 year old with right lung mass and numerous liver lesions. Findings are suggestive for metastatic disease and tissue diagnosis is needed. EXAM: ULTRASOUND-GUIDED LIVER LESION BIOPSY MEDICATIONS: None. ANESTHESIA/SEDATION: Moderate (conscious) sedation was employed during this procedure. A total of Versed 2.0 mg and Fentanyl 100 mcg was administered intravenously. Moderate Sedation Time: 20 minutes. The patient's level of consciousness and vital signs were monitored continuously by radiology nursing throughout the procedure under my direct supervision. FLUOROSCOPY TIME:  None COMPLICATIONS: None immediate. PROCEDURE: Informed written consent was obtained from the patient after a thorough discussion of the procedural risks, benefits and alternatives. All questions were addressed. Maximal Sterile Barrier Technique was utilized including caps, mask, sterile gowns, sterile gloves, sterile drape, hand hygiene and skin antiseptic. A timeout was performed prior to the initiation of the procedure. Liver was evaluated with ultrasound. Large lesion in the left hepatic lobe was targeted. The anterior abdomen was prepped with chlorhexidine and sterile field was created. Skin and soft tissues were anesthetized with 1% lidocaine. Using ultrasound guidance, 17 gauge coaxial needle was directed into the left hepatic lobe and large lesion. Three core biopsies were obtained with an 18 gauge core device. Specimens placed in formalin. Needle was removed without complication. Bandage placed over the puncture site. FINDINGS: Numerous lesions scattered throughout the liver. A large lesion in the medial left hepatic lobe was biopsied. No significant bleeding or hematoma formation following the core biopsies. Three adequate core biopsies were obtained. IMPRESSION: Ultrasound-guided core biopsies of a left hepatic lesion. Electronically Signed   By: Markus Daft M.D.   On: 01/16/2019 12:39   US Abdomen Limited RUQ  Result Date:  01/15/2019 CLINICAL DATA:  Elevated liver enzymes.  Hepatic metastatic disease EXAM: ULTRASOUND ABDOMEN LIMITED RIGHT UPPER QUADRANT COMPARISON:  CT 01/03/2019 FINDINGS: Gallbladder: No gallstones or wall thickening visualized. No sonographic Murphy sign noted by sonographer. Common bile duct: Diameter: 6 mm Liver: Numerous solid hepatic masses of varying sizes. Masses are predominantly echogenic with a hypoechoic halo. Background echogenicity is coarsened. Portal vein is patent on color Doppler imaging with normal  direction of blood flow towards the liver. Other: None. IMPRESSION: 1. Numerous solid masses throughout the liver compatible with hepatic metastatic disease. 2. Unremarkable appearance of the gallbladder. Electronically Signed   By: Davina Poke D.O.   On: 01/15/2019 17:03    Treyson Axel T. Frederick  If 7PM-7AM, please contact night-coverage [email or web address] Password [email or web address] 01/16/2019, 4:12 PM

## 2019-01-16 NOTE — Consult Note (Signed)
Chief Complaint: Patient was seen in consultation today for liver mass/biopsy.  Referring Physician(s): Debbe Bales  Supervising Physician: Markus Daft  Patient Status: [facility] - In-pt  History of Present Illness: Emily [facility] is a 59 y.o. female with a past medical history of HF, CAD, PE not on anticoagulation, GERD, gastric ulcer, hiatal hernia, diabetes mellitus type II, chronic back pain, and tobacco use. She presented to [facility] ED in late 12/2018 with complaints of fatigue, myalgia, and dyspnea. She was found to be COVID-19 + and was admitted for further management. In addition, was found to have a RLL lung mass with concerns for metastasis to the liver. IR was consulted who recommended transfer to [facility] for oncology input and and possible liver biopsy.  CT abdomen/pelvis 01/03/2019: 1. Findings consistent with widespread metastatic disease, likely secondary to a central right lower lobe lung mass, presumably obscured by postobstructive consolidation. Findings include marked thoracic nodal adenopathy, bilateral liver metastasis, and equivocal but suspicious osseous lesions. Possible upper abdominal nodal and left adrenal metastasis. Consider multidisciplinary thoracic oncology consultation for eventual sampling. 2. Concurrent right greater than left ground-glass opacities, likely related to COVID-19 pneumonia. 3. Small right pleural effusion. 4. Low-density lesion or lesions within the peripancreatic space and lesser sac, lymphangioma versus sequelae of prior pancreatitis. 5. Coronary artery atherosclerosis. Aortic Atherosclerosis (ICD10-I70.0). 6. Pelvic floor laxity.  US abdomen RUQ 01/15/2019: 1. Numerous solid masses throughout the liver compatible with hepatic metastatic disease. 2. Unremarkable appearance of the gallbladder.  IR requested by Dr. Cyndia Skeeters for possible image-guided liver lesion biopsy. Patient awake and alert laying in bed. Complains of headache, rated 7/10 at this  time. Denies fever, chills, chest pain, dyspnea, or abdominal pain.   No past medical history on file.   Allergies: Tetracyclines & related  Medications: Prior to Admission medications   Medication Sig Start Date End Date Taking? Authorizing Provider  Aspirin-Salicylamide-Caffeine (BC HEADACHE POWDER PO) Take 1 Package by mouth 3 (three) times daily as needed (headache or mild pain).   Yes [provider]  clopidogrel (PLAVIX) 75 MG tablet Take 75 mg by mouth every evening. 12/09/18  Yes [provider]  lisinopril (ZESTRIL) 10 MG tablet Take 10 mg by mouth daily. 10/21/18  Yes [provider]  LORazepam (ATIVAN) 1 MG tablet Take 1 mg by mouth 3 (three) times daily as needed for anxiety. 12/24/18  Yes [provider]  metFORMIN (GLUCOPHAGE) 500 MG tablet Take 500 mg by mouth daily with breakfast. 12/09/18  Yes [provider]  metoprolol succinate (TOPROL-XL) 50 MG 24 hr tablet Take 50 mg by mouth at bedtime. 12/20/18  Yes [provider]  NITROSTAT 0.4 MG SL tablet Place 0.4 mg under the tongue every 5 (five) minutes as needed for chest pain. 08/21/18  Yes [provider]  pantoprazole (PROTONIX) 40 MG tablet Take 40 mg by mouth 2 (two) times daily as needed for heartburn. 11/27/18  Yes [provider]  rosuvastatin (CRESTOR) 20 MG tablet Take 20 mg by mouth at bedtime. 11/11/18  Yes [provider]     No family history on file.  Social History   Socioeconomic History   Marital status: Married    Spouse name: Not on file   Number of children: Not on file   Years of education: Not on file   Highest education level: Not on file  Occupational History   Not on file  Tobacco Use   Smoking status: Former Smoker   Smokeless  tobacco: Never Used  Substance and Sexual Activity   Alcohol use: Not on file   Drug use: Not on file   Sexual activity: Not on file  Other Topics Concern   Not on file   Social History Narrative   Not on file   Social Determinants of Health   Financial Resource Strain:    Difficulty of Paying Living Expenses: Not on file  Food Insecurity:    Worried About [place] in the Last Year: Not on file   Ran Out of Food in the Last Year: Not on file  Transportation Needs:    Lack of Transportation (Medical): Not on file   Lack of Transportation (Non-Medical): Not on file  Physical Activity:    Days of Exercise per Week: Not on file   Minutes of Exercise per Session: Not on file  Stress:    Feeling of Stress : Not on file  Social Connections:    Frequency of Communication with Friends and Family: Not on file   Frequency of Social Gatherings with Friends and Family: Not on file   Attends Religious Services: Not on file   Active Member of Clubs or Organizations: Not on file   Attends Archivist Meetings: Not on file   Marital Status: Not on file     Review of Systems: A 12 point ROS discussed and pertinent positives are indicated in the HPI above.  All other systems are negative.  Review of Systems  Constitutional: Negative for chills and fever.  Respiratory: Negative for shortness of breath and wheezing.   Cardiovascular: Negative for chest pain and palpitations.  Gastrointestinal: Negative for abdominal pain.  Neurological: Positive for headaches.  Psychiatric/Behavioral: Negative for behavioral problems and confusion.    Vital Signs: BP 118/78 (BP Location: Left Arm)    Pulse 78    Temp 97.9 F (36.6 C) (Oral)    Resp 17    Ht 5' 1.97" (1.574 m)    Wt 132 lb 4.4 oz (60 kg)    SpO2 96% Comment: baseline sats 82% on room air   BMI 24.22 kg/m   Physical Exam Vitals and nursing note reviewed.  Constitutional:      General: She is not in acute distress.    Appearance: Normal appearance.  Cardiovascular:     Rate and Rhythm: Normal rate and regular rhythm.     Heart sounds: Normal heart sounds. No murmur.   Pulmonary:     Effort: Pulmonary effort is normal. No respiratory distress.     Breath sounds: Normal breath sounds. No wheezing.  Skin:    General: Skin is warm and dry.  Neurological:     Mental Status: She is alert and oriented to person, place, and time.      MD Evaluation Airway: WNL Heart: WNL Abdomen: WNL Chest/ Lungs: WNL ASA  Classification: 3 Mallampati/Airway Score: Two   Imaging: CT ABDOMEN PELVIS W WO CONTRAST  Result Date: 01/03/2019 CLINICAL DATA:  Epigastric abdominal pain. COVID-19 pneumonia. Coronary artery disease. Diabetes. Tobacco abuse. Drug seeking behavior. EXAM: CT ABDOMEN AND PELVIS WITHOUT AND WITH CONTRAST TECHNIQUE: Multidetector CT imaging of the abdomen and pelvis was performed following the standard protocol before and following the bolus administration of intravenous contrast. CONTRAST:  168mL OMNIPAQUE IOHEXOL 350 MG/ML SOLN COMPARISON:  Chest radiograph 12/30/2018. Renal ultrasound of 10/14/2018. Prior CT of 03/24/1999; report not available FINDINGS: Lower chest: Patchy areas of right greater than left ground-glass opacity. Right lower and less  so right middle lobe consolidation. Marked thoracic adenopathy. 2.2 cm precarinal node on 02/04. A subcarinal node measures 2.2 cm on 09/04. Right hilar adenopathy at 1.7 cm on 10/04. Obstruction of the right lower and right middle lobe bronchi. Normal heart size. Trace right pleural fluid. Lad coronary artery atherosclerosis. Hepatobiliary: Hepatomegaly at 20.4 cm craniocaudal. Multiple hepatic masses, including a segment 4A 7.5 x 5.7 cm on 23/2. Right hepatic lobe 3.4 x 3.8 cm mass on 39/2. Normal gallbladder, without biliary ductal dilatation. Pancreas: Normal, without mass or ductal dilatation. Spleen: Normal in size, without focal abnormality. Adrenals/Urinary Tract: Normal right adrenal gland. 1.4 cm left adrenal nodule with indeterminate density characteristics. Lower pole right renal 2.4 cm cyst. Bilateral too  small to characterize renal lesions. No hydronephrosis. Normal urinary bladder. Stomach/Bowel: Gastric antral underdistention. Minimal motion degradation in the upper abdomen. The cecum extends into the central pelvis. Normal terminal ileum. Appendix not visualized. Periampullary duodenal diverticulum.  Probable enterotomy. Vascular/Lymphatic: Aortic and branch vessel atherosclerosis. 9 mm left periaortic node on 36/2 is upper normal sized. An aortocaval node measures 9 mm on 35/2. Gastrohepatic ligament nodes of up to 1.6 cm on 29/2. Low-density bilobed lesion or lesions within the lesser sac and posterior to the pancreas, including at 1.8 x 2.3 cm on 28/2. No pelvic sidewall adenopathy. Reproductive: Normal uterus and adnexa. Other: No significant free fluid. Moderate to marked pelvic floor laxity. No evidence of omental or peritoneal disease. Musculoskeletal: Vague sclerotic lesions within the T10 vertebral body at 8 mm on 91 sagittal and T12 vertebral body measuring 7 mm on 86 sagittal. IMPRESSION: 1. Findings consistent with widespread metastatic disease, likely secondary to a central right lower lobe lung mass, presumably obscured by postobstructive consolidation. Findings include marked thoracic nodal adenopathy, bilateral liver metastasis, and equivocal but suspicious osseous lesions. Possible upper abdominal nodal and left adrenal metastasis. Consider multidisciplinary thoracic oncology consultation for eventual sampling. 2. Concurrent right greater than left ground-glass opacities, likely related to COVID-19 pneumonia. 3. Small right pleural effusion. 4. Low-density lesion or lesions within the peripancreatic space and lesser sac, lymphangioma versus sequelae of prior pancreatitis. 5. Coronary artery atherosclerosis. Aortic Atherosclerosis (ICD10-I70.0). 6. Pelvic floor laxity. These results will be called to the ordering clinician or representative by the Radiologist Assistant, and communication  documented in the PACS or zVision Dashboard. Electronically Signed   By: Abigail Miyamoto M.D.   On: 01/03/2019 10:31   CT HEAD WO CONTRAST  Result Date: 01/04/2019 CLINICAL DATA:  Small-cell lung cancer EXAM: CT HEAD WITHOUT CONTRAST TECHNIQUE: Contiguous axial images were obtained from the base of the skull through the vertex without intravenous contrast. COMPARISON:  None. FINDINGS: Brain: There is a soft tissue lesion identified in the posterior left occipital no with surrounding edema suspicious for metastatic disease. MRI with contrast material is recommended for further evaluation. No acute hemorrhage is seen. No acute infarct is noted. Vascular: No hyperdense vessel or unexpected calcification. Skull: Normal. Negative for fracture or focal lesion. Sinuses/Orbits: No acute finding. Other: None. IMPRESSION: Changes in the posterior left occipital lobe suspicious for metastatic disease. MRI with contrast is recommended for further evaluation. Electronically Signed   By: Inez Catalina M.D.   On: 01/04/2019 15:43   CT CHEST WO CONTRAST  Result Date: 01/04/2019 CLINICAL DATA:  Small-cell lung cancer staging EXAM: CT CHEST WITHOUT CONTRAST TECHNIQUE: Multidetector CT imaging of the chest was performed following the standard protocol without IV contrast. COMPARISON:  CT abdomen pelvis, 01/03/2019, chest radiograph, 12/30/2018 FINDINGS:  Cardiovascular: Normal heart size. Three-vessel coronary artery calcifications and/or stents. No pericardial effusion. Mediastinum/Nodes: Numerous bulky mediastinal and right hilar lymph nodes, largest pretracheal node measuring 3.9 x 2.3 cm (series 2, image 21). Thyroid gland, trachea, and esophagus demonstrate no significant findings. Lungs/Pleura: There is obstruction of the right bronchus intermedius and dense, near-total postobstructive consolidation of the right middle and right lower lobes. Underlying mass is not distinctly appreciated in the background of consolidation. There  is extensive, heterogeneous and ground-glass airspace opacity throughout the lungs bilaterally. Underlying centrilobular and paraseptal emphysema at the lung apices. There is subpleural bronchiolectasis of the mid lungs (series 4, image 21). Upper Abdomen: Bulky hepatic masses, better appreciated by previous dedicated CT of the abdomen and pelvis. Musculoskeletal: Subtle sclerotic lesions, for example of the right aspect of the T2 vertebral body (series 603, image 79), central T10 vertebral body (series 603, image 81) and T12 vertebral body (series 603, image 74). IMPRESSION: 1. Occlusion of the right bronchus intermedius and dense, near-total postobstructive consolidation of the right middle and right lower lobes. Underlying mass is not distinctly appreciated in the background of consolidation however findings are most consistent with primary lung malignancy. 2. Numerous bulky mediastinal and right hilar lymph nodes, consistent with metastatic disease. 3. Extensive, heterogeneous and ground-glass airspace opacity throughout the lungs bilaterally, consistent with reported COVID-19 infection. 4. Small right pleural effusion. 5. Underlying centrilobular and paraseptal emphysema at the lung apices. Emphysema (ICD10-J43.9). There is subpleural bronchiolectasis of the mid lungs (series 4, image 21), which may reflect prominent appearance of emphysema or alternately some degree of underlying fibrotic interstitial lung disease, difficult to evaluate due to extensive airspace disease and malignant consolidation of the right lung. 6. Bulky hepatic masses, better appreciated by previous dedicated CT of the abdomen and pelvis and consistent with abdominal metastatic disease. 7. Subtle sclerotic lesions within the thoracic spine, concerning for osseous metastatic disease. 8. Coronary artery disease.  Aortic Atherosclerosis (ICD10-I70.0). Electronically Signed   By: Eddie Candle M.D.   On: 01/04/2019 15:53   MR BRAIN W WO  CONTRAST  Result Date: 01/13/2019 CLINICAL DATA:  Brain mass or lesion. Right lower lobe lung cancer with metastatic disease to the liver, retroperitoneal nodes, and left adrenal gland. EXAM: MRI HEAD WITHOUT AND WITH CONTRAST TECHNIQUE: Multiplanar, multiecho pulse sequences of the brain and surrounding structures were obtained without and with intravenous contrast. CONTRAST:  7.21mL GADAVIST GADOBUTROL 1 MMOL/ML IV SOLN COMPARISON:  CT head without contrast 01/04/2019. MR head without and with contrast 05/24/2004 FINDINGS: Brain: No acute infarct, hemorrhage, or mass lesion is present. Mild atrophy and moderate diffuse white matter disease is markedly advanced for age. No enhancing parenchymal lesions are present. No significant extraaxial fluid collection is present. The ventricles are of normal size. The brainstem and cerebellum are within normal limits. A remote lacunar infarct is present at the inferior right cerebellum. Vascular: Flow is present in the major intracranial arteries. Skull and upper cervical spine: There is diffuse heterogeneous marrow appearance the calvarium with scattered infiltrative enhancement suggesting widespread metastatic disease to the skull and upper cervical spine. No pathologic trick fracture is present. Left frontal craniotomy is noted. Sinuses/Orbits: The paranasal sinuses and mastoid air cells are clear. The globes and orbits are within normal limits. IMPRESSION: 1. No evidence for metastatic disease to the brain or meninges. 2. Diffuse heterogeneous marrow appearance suggesting widespread metastatic disease to the skull and upper cervical spine. 3. 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 4. Mild atrophy  and moderate diffuse white matter disease is markedly advanced for age. This likely reflects the sequela of chronic microvascular ischemia. Electronically Signed   By: San Morelle M.D.   On: 01/13/2019 12:22   DG Chest Port 1V today  Result Date:  12/30/2018 CLINICAL DATA:  Shortness of breath.  COVID-19 pneumonia. EXAM: PORTABLE CHEST 1 VIEW COMPARISON:  12/29/2018-[facility] FINDINGS: Midline trachea. Mild cardiomegaly. Similar small right pleural effusion. No pneumothorax. Slightly worsened aeration, with bilateral interstitial opacities. Slight increase in right mid and lower lung opacification. Numerous leads and wires project over the chest. IMPRESSION: Minimal progression of multifocal, bilateral pneumonia. Similar small right pleural effusion. Electronically Signed   By: Abigail Miyamoto M.D.   On: 12/30/2018 12:36   ECHOCARDIOGRAM COMPLETE  Result Date: 01/02/2019   ECHOCARDIOGRAM REPORT   Patient Name:   BRYNLEE PENNYWELL [facility] Date of Exam: 01/02/2019 Medical Rec #:  [id number]      Height:       62.0 in Accession #:    [id number]     Weight:       159.8 lb Date of Birth:  [birth year]     BSA:          1.74 m Patient Age:    28 years       BP:           154/91 mmHg Patient Gender: F              HR:           65 bpm. Exam Location:  Inpatient Procedure: 2D Echo, Color Doppler and Cardiac Doppler Indications:    R07.9* Chest pain, unspecified  History:        Patient has no prior history of Echocardiogram examinations.                 CHF, CAD; Risk Factors:Hypertension and Diabetes. Verbal history                 of Echocardiogram in [facility].  Sonographer:    Raquel Sarna Senior RDCS Referring Phys: [id number] [place]  Sonographer Comments: Technically difficult study due to poor echo windows. Inpatient at [facility]  1. Left ventricular ejection fraction, by visual estimation, is 35 to 40%. The left ventricle has severely decreased function. There is mildly increased left ventricular hypertrophy.  2. Left ventricular diastolic parameters are consistent with Grade I diastolic dysfunction (impaired relaxation).  3. The left ventricle demonstrates global hypokinesis.  4. Global right ventricle has low normal systolic function.The right  ventricular size is normal. No increase in right ventricular wall thickness.  5. Left atrial size was normal.  6. Right atrial size was normal.  7. The mitral valve is abnormal. Trivial mitral valve regurgitation.  8. The tricuspid valve is not well visualized.  9. The aortic valve was not well visualized. Aortic valve regurgitation is mild. 10. The pulmonic valve was not well visualized. Pulmonic valve regurgitation is not visualized. 11. The inferior vena cava is normal in size with greater than 50% respiratory variability, suggesting right atrial pressure of 3 mmHg. 12. The interatrial septum was not well visualized. FINDINGS  Left Ventricle: Left ventricular ejection fraction, by visual estimation, is 35 to 40%. The left ventricle has severely decreased function. The left ventricle demonstrates global hypokinesis. There is mildly increased left ventricular hypertrophy. Left ventricular diastolic parameters are consistent with Grade I diastolic dysfunction (impaired relaxation). Indeterminate filling pressures. Right Ventricle: The right ventricular size is  normal. No increase in right ventricular wall thickness. Global RV systolic function is has low normal systolic function. Left Atrium: Left atrial size was normal in size. Right Atrium: Right atrial size was normal in size Pericardium: There is no evidence of pericardial effusion. Mitral Valve: The mitral valve is abnormal. There is mild thickening of the mitral valve leaflet(s). Trivial mitral valve regurgitation. Tricuspid Valve: The tricuspid valve is not well visualized. Tricuspid valve regurgitation is trivial. Aortic Valve: The aortic valve was not well visualized. Aortic valve regurgitation is mild. Aortic regurgitation PHT measures 1207 msec. Pulmonic Valve: The pulmonic valve was not well visualized. Pulmonic valve regurgitation is not visualized. Pulmonic regurgitation is not visualized. Aorta: The aortic root and ascending aorta are structurally  normal, with no evidence of dilitation. Venous: The inferior vena cava is normal in size with greater than 50% respiratory variability, suggesting right atrial pressure of 3 mmHg. IAS/Shunts: The interatrial septum was not well visualized.  LEFT VENTRICLE PLAX 2D LVIDd:         4.22 cm       Diastology LVIDs:         3.67 cm       LV e' lateral:   4.13 cm/s LV PW:         0.96 cm       LV E/e' lateral: 13.0 LV IVS:        1.06 cm       LV e' medial:    3.92 cm/s LVOT diam:     1.90 cm       LV E/e' medial:  13.7 LV SV:         22 ml LV SV Index:   12.45 LVOT Area:     2.84 cm  LV Volumes (MOD) LV area d, A2C:    36.60 cm LV area d, A4C:    40.90 cm LV area s, A2C:    27.00 cm LV area s, A4C:    30.90 cm LV major d, A2C:   8.77 cm LV major d, A4C:   9.01 cm LV major s, A2C:   8.11 cm LV major s, A4C:   8.24 cm LV vol d, MOD A2C: 127.0 ml LV vol d, MOD A4C: 153.0 ml LV vol s, MOD A2C: 76.9 ml LV vol s, MOD A4C: 98.9 ml LV SV MOD A2C:     50.1 ml LV SV MOD A4C:     153.0 ml LV SV MOD BP:      53.5 ml RIGHT VENTRICLE RV S prime:     13.20 cm/s TAPSE (M-mode): 2.6 cm LEFT ATRIUM             Index       RIGHT ATRIUM           Index LA diam:        3.30 cm 1.90 cm/m  RA Area:     10.10 cm LA Vol (A2C):   48.1 ml 27.69 ml/m RA Volume:   19.80 ml  11.40 ml/m LA Vol (A4C):   57.2 ml 32.93 ml/m LA Biplane Vol: 53.5 ml 30.80 ml/m  AORTIC VALVE LVOT Vmax:   119.00 cm/s LVOT Vmean:  79.200 cm/s LVOT VTI:    0.200 m AI PHT:      1207 msec  AORTA Ao Root diam: 3.10 cm Ao Asc diam:  3.20 cm MITRAL VALVE MV Area (PHT): 3.65 cm  SHUNTS MV PHT:        60.32 msec           Systemic VTI:  0.20 m MV Decel Time: 208 msec             Systemic Diam: 1.90 cm MV E velocity: 53.80 cm/s 103 cm/s MV A velocity: 64.00 cm/s 70.3 cm/s MV E/A ratio:  0.84       1.5  Lyman Bishop MD Electronically signed by Lyman Bishop MD Signature Date/Time: 01/02/2019/11:02:11 AM    Final    US Abdomen Limited RUQ  Result Date:  01/15/2019 CLINICAL DATA:  Elevated liver enzymes.  Hepatic metastatic disease EXAM: ULTRASOUND ABDOMEN LIMITED RIGHT UPPER QUADRANT COMPARISON:  CT 01/03/2019 FINDINGS: Gallbladder: No gallstones or wall thickening visualized. No sonographic Murphy sign noted by sonographer. Common bile duct: Diameter: 6 mm Liver: Numerous solid hepatic masses of varying sizes. Masses are predominantly echogenic with a hypoechoic halo. Background echogenicity is coarsened. Portal vein is patent on color Doppler imaging with normal direction of blood flow towards the liver. Other: None. IMPRESSION: 1. Numerous solid masses throughout the liver compatible with hepatic metastatic disease. 2. Unremarkable appearance of the gallbladder. Electronically Signed   By: Davina Poke D.O.   On: 01/15/2019 17:03    Labs:  CBC: Recent Labs    01/13/19 0237 01/14/19 0532 01/15/19 0441 01/16/19 0145  WBC 11.2* 11.4* 8.7 7.4  HGB 9.6* 9.6* 9.1* 8.5*  HCT 32.7* 32.6* 30.8* 29.1*  PLT 136* 130* 111* 112*    COAGS: Recent Labs    01/09/19 0500 01/16/19 0145  INR 1.0 1.1  APTT 32 26    BMP: Recent Labs    01/13/19 0237 01/14/19 0532 01/15/19 0441 01/16/19 0145  NA 142 142 144 143  K 4.8 3.7 3.3* 3.2*  CL 99 95* 96* 93*  CO2 34* 36* 36* 38*  GLUCOSE 105* 97 94 158*  BUN 27* 27* 28* 27*  CALCIUM 8.5* 8.2* 8.1* 8.2*  CREATININE 0.75 0.86 0.89 0.85  GFRNONAA >60 >60 >60 >60  GFRAA >60 >60 >60 >60    LIVER FUNCTION TESTS: Recent Labs    01/13/19 0237 01/14/19 0532 01/15/19 0441 01/16/19 0145  BILITOT 0.6 0.4 0.6 0.4  AST 124* 125* 140* 154*  ALT 72* 82* 83* 94*  ALKPHOS 319* 332* 322* 348*  PROT 5.4* 5.5* 5.5* 5.4*  ALBUMIN 2.1* 2.2* 2.1* 2.1*     Assessment and Plan:  Liver mass. Plan for image-guided liver mass biopsy today in IR. Patient is NPO. Afebrile and WBCs WNL. Last dose Plavix 01/11/2019- ok to proceed per IR protocol. INR 1.1 today.  Risks and benefits discussed with the  patient including, but not limited to bleeding, infection, damage to adjacent structures or low yield requiring additional tests. All of the patient's questions were answered, patient is agreeable to proceed. Consent signed and in chart.   Thank you for this interesting consult.  I greatly enjoyed meeting ALESA ECHEVARRIA and look forward to participating in their care.  A copy of this report was sent to the requesting provider on this date.  Electronically Signed: Earley Abide, PA-C 01/16/2019, 11:19 AM   I spent a total of 40 Minutes in face to face in clinical consultation, greater than 50% of which was counseling/coordinating care for liver mass/biopsy.

## 2019-01-16 NOTE — Procedures (Signed)
Interventional Radiology Procedure:   Indications: Metastatic disease and needs tissue diagnosis  Procedure: US guided liver lesion biopsy  Findings: Numerous liver lesions.  3 cores obtained from large lesion in left hepatic lobe   Complications: None     EBL: less than 10 ml  Plan: Bedrest 3 hours.   Emily Thomas R. Anselm Pancoast, MD  Pager: [phone]

## 2019-01-16 NOTE — Progress Notes (Signed)
Brief oncology note:  I was made aware of this patient and request for oncology consult.  She is currently on the Covid unit and recently finished treatment for COVID-19.  CT of the chest concerning for primary lung cancer.  CT of the abdomen pelvis shows widespread metastatic disease with liver metastasis and suspicious osseous lesions.  MRI the brain did not show any evidence of metastatic disease.  Tissue has not yet been obtained.  Interventional radiology planning for an ultrasound-guided liver biopsy today. We will plan to arrange for outpatient follow-up at the cancer center to follow-up on the biopsy results and discuss treatment options.  Scheduling message has been sent to arrange for outpatient follow-up.  Mikey Bussing, DNP, AGPCNP-BC, AOCNP

## 2019-01-17 ENCOUNTER — Other Ambulatory Visit: Payer: Self-pay

## 2019-01-17 DIAGNOSIS — E876 Hypokalemia: Secondary | ICD-10-CM

## 2019-01-17 DIAGNOSIS — F419 Anxiety disorder, unspecified: Secondary | ICD-10-CM

## 2019-01-17 DIAGNOSIS — F329 Major depressive disorder, single episode, unspecified: Secondary | ICD-10-CM

## 2019-01-17 LAB — CBC WITH DIFFERENTIAL/PLATELET
Abs Immature Granulocytes: 0.79 10*3/uL — ABNORMAL HIGH (ref 0.00–0.07)
Basophils Absolute: 0 10*3/uL (ref 0.0–0.1)
Basophils Relative: 0 %
Eosinophils Absolute: 0 10*3/uL (ref 0.0–0.5)
Eosinophils Relative: 0 %
HCT: 32.8 % — ABNORMAL LOW (ref 36.0–46.0)
Hemoglobin: 9.5 g/dL — ABNORMAL LOW (ref 12.0–15.0)
Immature Granulocytes: 10 %
Lymphocytes Relative: 12 %
Lymphs Abs: 0.9 10*3/uL (ref 0.7–4.0)
MCH: 25.3 pg — ABNORMAL LOW (ref 26.0–34.0)
MCHC: 29 g/dL — ABNORMAL LOW (ref 30.0–36.0)
MCV: 87.5 fL (ref 80.0–100.0)
Monocytes Absolute: 0.6 10*3/uL (ref 0.1–1.0)
Monocytes Relative: 7 %
Neutro Abs: 5.6 10*3/uL (ref 1.7–7.7)
Neutrophils Relative %: 71 %
Platelets: 119 10*3/uL — ABNORMAL LOW (ref 150–400)
RBC: 3.75 MIL/uL — ABNORMAL LOW (ref 3.87–5.11)
RDW: 17 % — ABNORMAL HIGH (ref 11.5–15.5)
WBC: 8 10*3/uL (ref 4.0–10.5)
nRBC: 1.6 % — ABNORMAL HIGH (ref 0.0–0.2)

## 2019-01-17 LAB — COMPREHENSIVE METABOLIC PANEL
ALT: 109 U/L — ABNORMAL HIGH (ref 0–44)
AST: 187 U/L — ABNORMAL HIGH (ref 15–41)
Albumin: 2.3 g/dL — ABNORMAL LOW (ref 3.5–5.0)
Alkaline Phosphatase: 423 U/L — ABNORMAL HIGH (ref 38–126)
Anion gap: 16 — ABNORMAL HIGH (ref 5–15)
BUN: 29 mg/dL — ABNORMAL HIGH (ref 6–20)
CO2: 38 mmol/L — ABNORMAL HIGH (ref 22–32)
Calcium: 8.3 mg/dL — ABNORMAL LOW (ref 8.9–10.3)
Chloride: 90 mmol/L — ABNORMAL LOW (ref 98–111)
Creatinine, Ser: 0.9 mg/dL (ref 0.44–1.00)
GFR calc Af Amer: >60 mL/min (ref >60)
GFR calc non Af Amer: >60 mL/min (ref >60)
Glucose, Bld: 144 mg/dL — ABNORMAL HIGH (ref 70–99)
Potassium: 3.1 mmol/L — ABNORMAL LOW (ref 3.5–5.1)
Sodium: 144 mmol/L (ref 135–145)
Total Bilirubin: 0.7 mg/dL (ref 0.3–1.2)
Total Protein: 5.8 g/dL — ABNORMAL LOW (ref 6.5–8.1)

## 2019-01-17 LAB — MAGNESIUM: Magnesium: 1.9 mg/dL (ref 1.7–2.4)

## 2019-01-17 LAB — GLUCOSE, CAPILLARY
Glucose-Capillary: 114 mg/dL — ABNORMAL HIGH (ref 70–99)
Glucose-Capillary: 137 mg/dL — ABNORMAL HIGH (ref 70–99)
Glucose-Capillary: 140 mg/dL — ABNORMAL HIGH (ref 70–99)
Glucose-Capillary: 142 mg/dL — ABNORMAL HIGH (ref 70–99)
Glucose-Capillary: 147 mg/dL — ABNORMAL HIGH (ref 70–99)
Glucose-Capillary: 239 mg/dL — ABNORMAL HIGH (ref 70–99)

## 2019-01-17 LAB — PHOSPHORUS: Phosphorus: 1.7 mg/dL — ABNORMAL LOW (ref 2.5–4.6)

## 2019-01-17 MED ORDER — POLYETHYLENE GLYCOL 3350 17 G PO PACK: 17.0000 g | PACK | Freq: Every day | ORAL | 0 refills | Status: AC

## 2019-01-17 MED ORDER — CLONAZEPAM 0.5 MG PO TABS: 0.5000 mg | ORAL_TABLET | Freq: Two times a day (BID) | ORAL | 0 refills | Status: AC

## 2019-01-17 MED ORDER — SPIRONOLACTONE 25 MG PO TABS
100.0000 mg | ORAL_TABLET | Freq: Every day | ORAL | Status: DC
  Administered 2019-01-17 – 2019-01-18 (×2): 100 mg via ORAL
  Filled 2019-01-17 (×2): qty 4

## 2019-01-17 MED ORDER — OXYCODONE HCL 5 MG PO TABS
5.0000 mg | ORAL_TABLET | Freq: Four times a day (QID) | ORAL | Status: DC | PRN
  Administered 2019-01-17 – 2019-01-18 (×2): 10 mg via ORAL
  Filled 2019-01-17 (×2): qty 2

## 2019-01-17 MED ORDER — BUSPIRONE HCL 7.5 MG PO TABS: 7.5000 mg | ORAL_TABLET | Freq: Three times a day (TID) | ORAL | 1 refills | Status: AC

## 2019-01-17 MED ORDER — ENSURE ENLIVE PO LIQD: 237.0000 mL | Freq: Two times a day (BID) | ORAL | 0 refills | Status: AC

## 2019-01-17 MED ORDER — POTASSIUM PHOSPHATES 15 MMOLE/5ML IV SOLN
30.0000 mmol | Freq: Once | INTRAVENOUS | Status: AC
  Administered 2019-01-17: 30 mmol via INTRAVENOUS
  Filled 2019-01-17: qty 10

## 2019-01-17 MED ORDER — TORSEMIDE 20 MG PO TABS
20.0000 mg | ORAL_TABLET | Freq: Every day | ORAL | Status: DC
  Administered 2019-01-17 – 2019-01-18 (×2): 20 mg via ORAL
  Filled 2019-01-17 (×2): qty 1

## 2019-01-17 MED ORDER — OXYCODONE HCL 10 MG PO TABS: 10.0000 mg | ORAL_TABLET | Freq: Three times a day (TID) | ORAL | 0 refills | Status: AC | PRN

## 2019-01-17 MED ORDER — SUCRALFATE 1 GM/10ML PO SUSP: 1.0000 g | Freq: Three times a day (TID) | ORAL | 0 refills | Status: AC

## 2019-01-17 MED ORDER — NALOXONE HCL 0.4 MG/ML IJ SOLN: INTRAMUSCULAR | 0 refills | Status: AC

## 2019-01-17 MED ORDER — NICOTINE 21 MG/24HR TD PT24: 21.0000 mg | MEDICATED_PATCH | Freq: Every day | TRANSDERMAL | 0 refills | Status: AC

## 2019-01-17 MED ORDER — TORSEMIDE 20 MG PO TABS: 20.0000 mg | ORAL_TABLET | Freq: Every day | ORAL | 1 refills | Status: AC

## 2019-01-17 MED ORDER — SPIRONOLACTONE 100 MG PO TABS: 100.0000 mg | ORAL_TABLET | Freq: Every day | ORAL | 1 refills | Status: AC

## 2019-01-17 MED ORDER — MORPHINE SULFATE ER 15 MG PO TBCR: 30.0000 mg | EXTENDED_RELEASE_TABLET | Freq: Two times a day (BID) | ORAL | 0 refills | Status: DC

## 2019-01-17 MED ORDER — ONDANSETRON HCL 4 MG PO TABS: 4.0000 mg | ORAL_TABLET | Freq: Four times a day (QID) | ORAL | 0 refills | Status: AC | PRN

## 2019-01-17 MED ORDER — SENNOSIDES-DOCUSATE SODIUM 8.6-50 MG PO TABS: 1.0000 | ORAL_TABLET | Freq: Two times a day (BID) | ORAL | Status: DC | PRN

## 2019-01-17 MED ORDER — DULOXETINE HCL 30 MG PO CPEP: 30.0000 mg | ORAL_CAPSULE | Freq: Every day | ORAL | 1 refills | Status: AC

## 2019-01-17 NOTE — Progress Notes (Signed)
SATURATION QUALIFICATIONS: (This note is used to comply with regulatory documentation for home oxygen)  Patient Saturations on Room Air at Rest = 94%  Patient Saturations on Room Air while Ambulating = 84%  Patient Saturations on 2 Liters of oxygen while Ambulating = 90%  Please briefly explain why patient needs home oxygen:

## 2019-01-17 NOTE — Progress Notes (Signed)
Dr Silas [place] aware of situation. Pt has nothing for pain at home. See new orders entered. Discharge cancelled. Daughter aware.

## 2019-01-17 NOTE — Progress Notes (Signed)
Physical Therapy Treatment Patient Details Name: Emily Thomas MRN: [id number] DOB: 1960-12-21 Today's Date: 01/17/2019    History of Present Illness 59 y.o. female  with PMH including degenerative disc disease, chronic back pain, DM type 2, HTN, PE no longer on anticoagulation, CAD, combined CHF admitted on 12/30/2018 with shortness of breath. Patient transferred from [facility] for Covid pneumonia and acute respiratory failure with hypoxia. Patient's course complicated by acute pancreatitis. ECHO 12/24 revealed systolic/diastolic heart failure with EF 35-40%. Abdominal CT revealed liver and lung mass, suspicious for RLL metastatic lung cancer and sclerotic lesions. CT head suspicious for metastases in left occipital lobe.     PT Comments    Pt drowsy today with increased processing time noted, suspect due to pain medications. Pt ambulated short hallway distance with min assist from PT and use of RW, sats 86-94% on 2LO2 with improved sats with breathing technique. Pt given HEP (exercises in "LE exercises" section of note) to promote LE strengthening, made via medbridge and handout administered. Pt plans to d/c home with 24/7 care from family.   Follow Up Recommendations  Home health PT;Supervision/Assistance - 24 hour     Equipment Recommendations  Rolling walker with 5" wheels    Recommendations for Other Services       Precautions / Restrictions Precautions Precautions: Fall Restrictions Weight Bearing Restrictions: No    Mobility  Bed Mobility Overal bed mobility: Needs Assistance             General bed mobility comments: pt in recliner upon arrival to room, requesting stay in recliner  Transfers Overall transfer level: Needs assistance Equipment used: Rolling walker (2 wheeled);1 person hand held assist Transfers: Sit to/from [facility] Sit to Stand: Min guard Stand pivot transfers: Min assist       General transfer comment: Min guard for  safety, Min assist for stand pivot with HHA +1 to [facility] for steadying and guiding to toilet.  Ambulation/Gait Ambulation/Gait assistance: Min assist Gait Distance (Feet): 40 Feet Assistive device: Rolling walker (2 wheeled) Gait Pattern/deviations: Step-through pattern;Decreased stride length;Drifts right/left;Narrow base of support Gait velocity: decr   General Gait Details: Min assist for steadying, verbal cuing for widening BOS as pt with near-scissoring. Pt with 2 periods of RW leaning requiring PT assist to correct. SpO2 86-94% on 2LO2 during ambulation, sats improved to 89-91% on 2LO2 with breathing technique (in through nose, out through mouth)   Stairs Stairs: (pt politely declined stair training)           Wheelchair Mobility    Modified Rankin (Stroke Patients Only)       Balance Overall balance assessment: Needs assistance Sitting-balance support: No upper extremity supported;Feet supported Sitting balance-Leahy Scale: Good     Standing balance support: No upper extremity supported Standing balance-Leahy Scale: Poor Standing balance comment: possibly due to medication - pt received morphine this afternoon and with decreased arousal since                            Cognition Arousal/Alertness: Lethargic;Suspect due to medications Behavior During Therapy: Flat affect Overall Cognitive Status: No family/caregiver present to determine baseline cognitive functioning                                 General Comments: got morphine this afternoon, pt with decreased arousal and increased response time to answer questions  Exercises General Exercises - Lower Extremity Long Arc Quad: AROM;Both;10 reps;Seated Hip ABduction/ADduction: AROM;Both;10 reps;Seated Hip Flexion/Marching: AROM;Both;10 reps;Seated    General Comments        Pertinent Vitals/Pain Pain Assessment: Faces Faces Pain Scale: Hurts even more Pain Location:  stomach Pain Descriptors / Indicators: Discomfort;Sore Pain Intervention(s): Limited activity within patient's tolerance;Monitored during session;Repositioned    Home Living                      Prior Function            PT Goals (current goals can now be found in the care plan section) Acute Rehab PT Goals PT Goal Formulation: With patient Time For Goal Achievement: 01/21/19 Potential to Achieve Goals: Fair Progress towards PT goals: Progressing toward goals    Frequency    Min 3X/week      PT Plan Current plan remains appropriate    Co-evaluation              AM-PAC PT "6 Clicks" Mobility   Outcome Measure  Help needed turning from your back to your side while in a flat bed without using bedrails?: A Little Help needed moving from lying on your back to sitting on the side of a flat bed without using bedrails?: A Little Help needed moving to and from a bed to a chair (including a wheelchair)?: A Little Help needed standing up from a chair using your arms (e.g., wheelchair or bedside chair)?: A Little Help needed to walk in hospital room?: A Little Help needed climbing 3-5 steps with a railing? : A Little 6 Click Score: 18    End of Session Equipment Utilized During Treatment: Oxygen Activity Tolerance: Patient limited by fatigue;Patient limited by pain Patient left: with call bell/phone within reach;in chair;with chair alarm set Nurse Communication: Mobility status PT Visit Diagnosis: History of falling (Z91.81);Unsteadiness on feet (R26.81);Muscle weakness (generalized) (M62.81)     Time: 4801-6553 PT Time Calculation (min) (ACUTE ONLY): 25 min  Charges:  $Gait Training: 8-22 mins $Therapeutic Exercise: 8-22 mins                     Zain Bingman E, PT Acute Rehabilitation Services Pager [phone]  Office [phone]   Adams Hinch D Elonda Husky 01/17/2019, 5:05 PM

## 2019-01-17 NOTE — Discharge Summary (Signed)
Physician Discharge Summary  Emily Thomas WGN:[id number] DOB: 25-May-1960 DOA: 12/30/2018  PCP: Patient, No Pcp Per  Admit date: 12/30/2018 Discharge date: 01/17/2019  Admitted From: Home Disposition: Home  Recommendations for Outpatient Follow-up:  1. Follow up with PCP in 1 week 2. Oncology to arrange outpatient follow-up 3. Ambulatory referral to palliative care ordered. 4. Please obtain CBC/CMP/Mag at follow up 5. Please follow up on the following pending results: Pathology from liver biopsy  Home Health: PT/OT/RN Equipment/Devices: Oxygen, rolling walker and 3 in 1 commode  Discharge Condition: Guarded prognosis CODE STATUS: Full code   Hospital Course: 59 year old female with history of combined CHF, CAD, DM-2, tobacco use disorder, DDD/chronic back pain,  remote PE not on anticoagulation, gastric ulcer/GERD and hiatal hernia presented to [place] with fatigue, myalgia and shortness of breath since he had bowel prep for EGD/colonoscopy mid December.  Diagnosed with acute respiratory failure with hypoxia due to COVID-19 pneumonia and transfer to [facility] for further care.  Upon further evaluation, found to have RLL lung cancer/mass concerning for metastasis to liver, intrathoracic lymph nodes, adrenal glands, skull, cervical and thoracic spines. Case discussed with IR who was suggested transfer to [facility] for oncology input and possible liver biopsy if patient wishes to pursue treatment.  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   Patient had liver biopsy on 01/15/2018.  Pathology pending.  Oncology to arrange outpatient follow-up and discuss treatment options after pathology result.   In regards to COVID-19 infection, patient completed 5 days of remdesivir course on 01/03/2019.  Continued on dexamethasone.  Patient  was ambulated on room air and desaturated requiring 2 L to maintain saturation above 90%.  Accordingly, she was discharged on 2 L by nasal cannula.  Patient was evaluated by PT/OT who recommended home health with 24-hour supervision, rolling walker and 3 in 1 commode.    See individual problem list below for more hospital course.  Discharge Diagnoses:  Right lower lobe lung cancer/mass concerning for metastasis to liver, intrathoracic lymph nodes, adrenal glands, cervical and thoracic spines, and skull -US guided liver biopsy on 1/14.  Pathology pending. -Oncology to arrange outpatient follow-up after pathology. -Pain medications as below. -Ambulatory referral to palliative care ordered.  Acute respiratory failure with hypoxia due to COVID-19 pneumonia: Desaturated with ambulation on room air requiring 2 L to maintain saturation above 90%. -Completed 5 days of remdesivir on 01/03/2019.  Received Decadron from 01/14/2018. -Discharged on 2 L by nasal cannula  Chronic combined CHF: Echo on 12/31 with LVEF of 35-40%, G1-DD and global hypokinesis but no other major finding.  Appears euvolemic on exam.  I&O incomplete.  Weight downtrending.  Renal function stable. -Discharged on torsemide 20 mg daily, Aldactone 100 mg daily and metoprolol XL 25 mg daily -May consider ARB or Entresto outpatient. -Recheck BMP and magnesium in 1 week.  Mildly elevated lipase/abdominal pain: No evidence of pancreatitis on imaging.  -MS Contin 15 mg twice daily, oxycodone 10 mg every 8 hours as needed for breakthrough pain -Counseled on adverse side effects and judicious use of pain medication -Narcan injection as needed -Rx for bowel regimen as below.  Acute on chronic pain/cancer pain/abdominal pain/DDD:  -Pain regimen as above.  Uncontrolled DM-2 with hyperglycemia: A1c 7.2% on 12/28. Recent Labs    01/17/19 0800 01/17/19 1200 01/17/19 1724  GLUCAP 114* 239* 137*  -  Discharged on home medication  simvastatin.  Transaminitis/elevated alkaline phosphatase: Slightly up trended.  Could be due to COVID-19 or malignancy.  CK within normal.  Acute hepatitis panel negative.  RUQ ultrasound with known liver masses but no biliary issue. -Recheck CMP at follow-up.  COPD exacerbation?-No history of seizures prior to admission.  Not on breathing treatments. -Encouraged tobacco cessation  History of CAD s/p PCI: Stable no anginal symptoms. -Cardiac meds as above. -Continue Plavix and statin.  Essential hypertension: Normotensive. -Cardiac meds as above  Remote history of PE: No longer on anticoagulation.  Anxiety/insomnia:  -Discharged on Klonopin 0.5 mg twice daily and BuSpar 7.5 mg 3 times daily. -Discontinued home Ativan. -Continue home Cymbalta.  Tobacco use disorder: Smokes about a pack a day. -Encouraged cessation -Nicotine patch  Hypokalemia/hypomagnesemia/hypophosphatemia: Hypokalemia and hypophosphatemia replenished prior to discharge. -Reduce torsemide to 20 mg daily and increased Aldactone to 100 mg daily to help with hypokalemia. -Check CMP in 1 week.  History of PUD/GERD: -Continue Protonix and Carafate.  Goal of care discussion: Patient with significant cardiopulmonary comorbidities as above.  Now with right lung mass concerning for metastatic lung cancer to multiple organs.  Patient wishes to pursue biopsy and aggressive treatment.  She wishes to remain full code.  Palliative care involved. -Ordered ambulatory referral to palliative care   Discharge Instructions  Discharge Instructions    Amb Referral to Palliative Care   Complete by: As directed    Diet - low sodium heart healthy   Complete by: As directed    Diet Carb Modified   Complete by: As directed    Discharge instructions   Complete by: As directed    It has been a pleasure taking care of you! You were hospitalized and treated for COVID-19 infection.  You were also diagnosed with lung  cancer.  Your oncologists (cancer doctors) will set up outpatient follow-up for further discussion about treatment options after pathology results.  You were given prescription for pain medications.  These medications have a lot of side effects.  We strongly encourage you to be cautious and use them as prescribed. Please review your new medication list and the directions before you take your medications.  Please follow-up with your primary care doctor in 1 week for blood work-up.   In regards to Covid infection, you are still potentially infectious for the next 7 days. We recommend you isolate yourself and take the necessary precautions to prevent the virus from spreading.  Some of the steps to prevent the virus from spreading to others: Stay away from other and members of your household at least for 7 days. Let healthy household members care for children and pets, if possible. If you have to care for children or pets, wash your hands often and wear a mask. If possible, stay in your own room, separate from others. Use a different bathroom.Make sure that all people in your household wash their hands well and often. Leave your house only to seek medical care. Do not use public transport. Do not travel while you are sick. Wash your hands often with soap and water for 20 seconds. If soap and water are not available, use alcohol-based hand sanitizer. Cough or sneeze into a tissue or your sleeve or elbow. Do not cough or sneeze into your hand or into the air. Wear a cloth face covering or face mask.  Return precautions: Get help or return to the hospital right away if: You have trouble breathing. You have pain or pressure  in your chest. You have confusion. You have bluish lips and fingernails. You have difficulty waking from sleep. You have symptoms that get worse. These symptoms may represent a serious problem that is an emergency. Do not wait to see if the symptoms will go away. Get medical help  right away. Call your local emergency services (911 in the [place] Do not drive yourself to the hospital. Let the emergency medical personnel know if you think you have COVID-19.  To protect yourself in the future:  Do not travel to areas where COVID-19 is a risk. The areas where COVID-19 is reported change often. To identify high-risk areas and travel restrictions, check the CDC travel website: FatFares.com.br If you live in, or must travel to, an area where COVID-19 is a risk, take precautions to avoid infection. Stay away from people who are sick. Wash your hands often with soap and water for 20 seconds. If soap and water are not available, use an alcohol-based hand sanitizer. Avoid touching your mouth, face, eyes, or nose. Avoid going out in public, follow guidance from your state and local health authorities. If you must go out in public, wear a cloth face covering or face mask. Disinfect objects and surfaces that are frequently touched every day. This may include: Counters and tables. Doorknobs and light switches. Sinks and faucets. Electronics, such as phones, remote controls, keyboards, computers, and tablets.    Where to find more information Centers for Disease Control and Prevention: PurpleGadgets.be World Health Organization: https://www.castaneda.info/    Take care,   Increase activity slowly   Complete by: As directed    Increase activity slowly   Complete by: As directed    MyChart COVID-19 home monitoring program   Complete by: Jan 17, 2019    Is the patient willing to use the [place] for home monitoring?: No     Allergies as of 01/17/2019      Reactions   Tetracyclines & Related Rash   Many years ago      Medication List    STOP taking these medications   BC HEADACHE POWDER PO   lisinopril 10 MG tablet Commonly known as: ZESTRIL   LORazepam 1 MG tablet Commonly known as: ATIVAN     TAKE  these medications   busPIRone 7.5 MG tablet Commonly known as: BUSPAR Take 1 tablet (7.5 mg total) by mouth 3 (three) times daily.   clonazePAM 0.5 MG tablet Commonly known as: KlonoPIN Take 1 tablet (0.5 mg total) by mouth 2 (two) times daily.   clopidogrel 75 MG tablet Commonly known as: PLAVIX Take 75 mg by mouth every evening.   DULoxetine 30 MG capsule Commonly known as: CYMBALTA Take 1 capsule (30 mg total) by mouth daily.   feeding supplement (ENSURE ENLIVE) Liqd Take 237 mLs by mouth 2 (two) times daily between meals.   metFORMIN 500 MG tablet Commonly known as: GLUCOPHAGE Take 500 mg by mouth daily with breakfast.   metoprolol succinate 50 MG 24 hr tablet Commonly known as: TOPROL-XL Take 50 mg by mouth at bedtime.   morphine 15 MG 12 hr tablet Commonly known as: MS Contin Take 2 tablets (30 mg total) by mouth every 12 (twelve) hours.   naloxone 0.4 MG/ML injection Commonly known as: NARCAN Inject 0.4mg  (3ml), repeat every 2 to 3 minutes for up to three doses if no resposnse   nicotine 21 mg/24hr patch Commonly known as: NICODERM CQ - dosed in mg/24 hours Place 1 patch (21 mg  total) onto the skin daily.   Nitrostat 0.4 MG SL tablet Generic drug: nitroGLYCERIN Place 0.4 mg under the tongue every 5 (five) minutes as needed for chest pain.   ondansetron 4 MG tablet Commonly known as: ZOFRAN Take 1 tablet (4 mg total) by mouth every 6 (six) hours as needed for nausea.   Oxycodone HCl 10 MG Tabs Take 1 tablet (10 mg total) by mouth every 8 (eight) hours as needed for severe pain or breakthrough pain.   pantoprazole 40 MG tablet Commonly known as: PROTONIX Take 40 mg by mouth 2 (two) times daily as needed for heartburn.   polyethylene glycol 17 g packet Commonly known as: MIRALAX / GLYCOLAX Take 17 g by mouth daily.   rosuvastatin 20 MG tablet Commonly known as: CRESTOR Take 20 mg by mouth at bedtime.   spironolactone 100 MG tablet Commonly known as:  ALDACTONE Take 1 tablet (100 mg total) by mouth daily. Start taking on: January 18, 2019   sucralfate 1 GM/10ML suspension Commonly known as: CARAFATE Take 10 mLs (1 g total) by mouth 4 (four) times daily -  before meals and at bedtime.   torsemide 20 MG tablet Commonly known as: DEMADEX Take 1 tablet (20 mg total) by mouth daily. Start taking on: January 18, 2019            Durable Medical Equipment  (From admission, onward)         Start     Ordered   01/17/19 1753  For home use only DME Bedside commode  Once    Comments: 3 in 1 commode  Question:  Patient needs a bedside commode to treat with the following condition  Answer:  Unsteady gait   01/17/19 1752   01/17/19 1752  For home use only DME Walker rolling  Once    Question Answer Comment  Walker: With [place]   Patient needs a walker to treat with the following condition Unsteady gait      01/17/19 1751   01/17/19 1144  For home use only DME oxygen  Once    Question Answer Comment  Length of Need 6 Months   Mode or (Route) Nasal cannula   Liters per Minute 2   Frequency Continuous (stationary and portable oxygen unit needed)   Oxygen conserving device Yes   Oxygen delivery system Gas      01/17/19 1143          Consultations:  IR and oncology  Procedures/Studies:  2D Echo on 01/02/2019  1. Left ventricular ejection fraction, by visual estimation, is 35 to 40%. The left ventricle has severely decreased function. There is mildly increased left ventricular hypertrophy.  2. Left ventricular diastolic parameters are consistent with Grade I diastolic dysfunction (impaired relaxation).  3. The left ventricle demonstrates global hypokinesis.  4. Global right ventricle has low normal systolic function.The right ventricular size is normal. No increase in right ventricular wall thickness.  5. Left atrial size was normal.  6. Right atrial size was normal.  7. The mitral valve is abnormal. Trivial mitral  valve regurgitation.  8. The tricuspid valve is not well visualized.  9. The aortic valve was not well visualized. Aortic valve regurgitation is mild. 10. The pulmonic valve was not well visualized. Pulmonic valve regurgitation is not visualized. 11. The inferior vena cava is normal in size with greater than 50% respiratory variability, suggesting right atrial pressure of 3 mmHg. 12. The interatrial septum was not well visualized.  01/16/2019-liver biopsy by interventional radiology   CT ABDOMEN PELVIS W WO CONTRAST  Result Date: 01/03/2019 CLINICAL DATA:  Epigastric abdominal pain. COVID-19 pneumonia. Coronary artery disease. Diabetes. Tobacco abuse. Drug seeking behavior. EXAM: CT ABDOMEN AND PELVIS WITHOUT AND WITH CONTRAST TECHNIQUE: Multidetector CT imaging of the abdomen and pelvis was performed following the standard protocol before and following the bolus administration of intravenous contrast. CONTRAST:  138mL OMNIPAQUE IOHEXOL 350 MG/ML SOLN COMPARISON:  Chest radiograph 12/30/2018. Renal ultrasound of 10/14/2018. Prior CT of 03/24/1999; report not available FINDINGS: Lower chest: Patchy areas of right greater than left ground-glass opacity. Right lower and less so right middle lobe consolidation. Marked thoracic adenopathy. 2.2 cm precarinal node on 02/04. A subcarinal node measures 2.2 cm on 09/04. Right hilar adenopathy at 1.7 cm on 10/04. Obstruction of the right lower and right middle lobe bronchi. Normal heart size. Trace right pleural fluid. Lad coronary artery atherosclerosis. Hepatobiliary: Hepatomegaly at 20.4 cm craniocaudal. Multiple hepatic masses, including a segment 4A 7.5 x 5.7 cm on 23/2. Right hepatic lobe 3.4 x 3.8 cm mass on 39/2. Normal gallbladder, without biliary ductal dilatation. Pancreas: Normal, without mass or ductal dilatation. Spleen: Normal in size, without focal abnormality. Adrenals/Urinary Tract: Normal right adrenal gland. 1.4 cm left adrenal nodule with  indeterminate density characteristics. Lower pole right renal 2.4 cm cyst. Bilateral too small to characterize renal lesions. No hydronephrosis. Normal urinary bladder. Stomach/Bowel: Gastric antral underdistention. Minimal motion degradation in the upper abdomen. The cecum extends into the central pelvis. Normal terminal ileum. Appendix not visualized. Periampullary duodenal diverticulum.  Probable enterotomy. Vascular/Lymphatic: Aortic and branch vessel atherosclerosis. 9 mm left periaortic node on 36/2 is upper normal sized. An aortocaval node measures 9 mm on 35/2. Gastrohepatic ligament nodes of up to 1.6 cm on 29/2. Low-density bilobed lesion or lesions within the lesser sac and posterior to the pancreas, including at 1.8 x 2.3 cm on 28/2. No pelvic sidewall adenopathy. Reproductive: Normal uterus and adnexa. Other: No significant free fluid. Moderate to marked pelvic floor laxity. No evidence of omental or peritoneal disease. Musculoskeletal: Vague sclerotic lesions within the T10 vertebral body at 8 mm on 91 sagittal and T12 vertebral body measuring 7 mm on 86 sagittal. IMPRESSION: 1. Findings consistent with widespread metastatic disease, likely secondary to a central right lower lobe lung mass, presumably obscured by postobstructive consolidation. Findings include marked thoracic nodal adenopathy, bilateral liver metastasis, and equivocal but suspicious osseous lesions. Possible upper abdominal nodal and left adrenal metastasis. Consider multidisciplinary thoracic oncology consultation for eventual sampling. 2. Concurrent right greater than left ground-glass opacities, likely related to COVID-19 pneumonia. 3. Small right pleural effusion. 4. Low-density lesion or lesions within the peripancreatic space and lesser sac, lymphangioma versus sequelae of prior pancreatitis. 5. Coronary artery atherosclerosis. Aortic Atherosclerosis (ICD10-I70.0). 6. Pelvic floor laxity. These results will be called to the  ordering clinician or representative by the Radiologist Assistant, and communication documented in the PACS or zVision Dashboard. Electronically Signed   By: Abigail Miyamoto M.D.   On: 01/03/2019 10:31   CT HEAD WO CONTRAST  Result Date: 01/04/2019 CLINICAL DATA:  Small-cell lung cancer EXAM: CT HEAD WITHOUT CONTRAST TECHNIQUE: Contiguous axial images were obtained from the base of the skull through the vertex without intravenous contrast. COMPARISON:  None. FINDINGS: Brain: There is a soft tissue lesion identified in the posterior left occipital no with surrounding edema suspicious for metastatic disease. MRI with contrast material is recommended for further evaluation. No acute hemorrhage is seen. No  acute infarct is noted. Vascular: No hyperdense vessel or unexpected calcification. Skull: Normal. Negative for fracture or focal lesion. Sinuses/Orbits: No acute finding. Other: None. IMPRESSION: Changes in the posterior left occipital lobe suspicious for metastatic disease. MRI with contrast is recommended for further evaluation. Electronically Signed   By: Inez Catalina M.D.   On: 01/04/2019 15:43   CT CHEST WO CONTRAST  Result Date: 01/04/2019 CLINICAL DATA:  Small-cell lung cancer staging EXAM: CT CHEST WITHOUT CONTRAST TECHNIQUE: Multidetector CT imaging of the chest was performed following the standard protocol without IV contrast. COMPARISON:  CT abdomen pelvis, 01/03/2019, chest radiograph, 12/30/2018 FINDINGS: Cardiovascular: Normal heart size. Three-vessel coronary artery calcifications and/or stents. No pericardial effusion. Mediastinum/Nodes: Numerous bulky mediastinal and right hilar lymph nodes, largest pretracheal node measuring 3.9 x 2.3 cm (series 2, image 21). Thyroid gland, trachea, and esophagus demonstrate no significant findings. Lungs/Pleura: There is obstruction of the right bronchus intermedius and dense, near-total postobstructive consolidation of the right middle and right lower lobes.  Underlying mass is not distinctly appreciated in the background of consolidation. There is extensive, heterogeneous and ground-glass airspace opacity throughout the lungs bilaterally. Underlying centrilobular and paraseptal emphysema at the lung apices. There is subpleural bronchiolectasis of the mid lungs (series 4, image 21). Upper Abdomen: Bulky hepatic masses, better appreciated by previous dedicated CT of the abdomen and pelvis. Musculoskeletal: Subtle sclerotic lesions, for example of the right aspect of the T2 vertebral body (series 603, image 79), central T10 vertebral body (series 603, image 81) and T12 vertebral body (series 603, image 74). IMPRESSION: 1. Occlusion of the right bronchus intermedius and dense, near-total postobstructive consolidation of the right middle and right lower lobes. Underlying mass is not distinctly appreciated in the background of consolidation however findings are most consistent with primary lung malignancy. 2. Numerous bulky mediastinal and right hilar lymph nodes, consistent with metastatic disease. 3. Extensive, heterogeneous and ground-glass airspace opacity throughout the lungs bilaterally, consistent with reported COVID-19 infection. 4. Small right pleural effusion. 5. Underlying centrilobular and paraseptal emphysema at the lung apices. Emphysema (ICD10-J43.9). There is subpleural bronchiolectasis of the mid lungs (series 4, image 21), which may reflect prominent appearance of emphysema or alternately some degree of underlying fibrotic interstitial lung disease, difficult to evaluate due to extensive airspace disease and malignant consolidation of the right lung. 6. Bulky hepatic masses, better appreciated by previous dedicated CT of the abdomen and pelvis and consistent with abdominal metastatic disease. 7. Subtle sclerotic lesions within the thoracic spine, concerning for osseous metastatic disease. 8. Coronary artery disease.  Aortic Atherosclerosis (ICD10-I70.0).  Electronically Signed   By: Eddie Candle M.D.   On: 01/04/2019 15:53   MR BRAIN W WO CONTRAST  Result Date: 01/13/2019 CLINICAL DATA:  Brain mass or lesion. Right lower lobe lung cancer with metastatic disease to the liver, retroperitoneal nodes, and left adrenal gland. EXAM: MRI HEAD WITHOUT AND WITH CONTRAST TECHNIQUE: Multiplanar, multiecho pulse sequences of the brain and surrounding structures were obtained without and with intravenous contrast. CONTRAST:  7.28mL GADAVIST GADOBUTROL 1 MMOL/ML IV SOLN COMPARISON:  CT head without contrast 01/04/2019. MR head without and with contrast 05/24/2004 FINDINGS: Brain: No acute infarct, hemorrhage, or mass lesion is present. Mild atrophy and moderate diffuse white matter disease is markedly advanced for age. No enhancing parenchymal lesions are present. No significant extraaxial fluid collection is present. The ventricles are of normal size. The brainstem and cerebellum are within normal limits. A remote lacunar infarct is present at the inferior right  cerebellum. Vascular: Flow is present in the major intracranial arteries. Skull and upper cervical spine: There is diffuse heterogeneous marrow appearance the calvarium with scattered infiltrative enhancement suggesting widespread metastatic disease to the skull and upper cervical spine. No pathologic trick fracture is present. Left frontal craniotomy is noted. Sinuses/Orbits: The paranasal sinuses and mastoid air cells are clear. The globes and orbits are within normal limits. IMPRESSION: 1. No evidence for metastatic disease to the brain or meninges. 2. Diffuse heterogeneous marrow appearance suggesting widespread metastatic disease to the skull and upper cervical spine. 3. 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 4. Mild atrophy and moderate diffuse white matter disease is markedly advanced for age. This likely reflects the sequela of chronic microvascular ischemia. Electronically Signed   By:  San Morelle M.D.   On: 01/13/2019 12:22   IR US Guide Bx Asp/Drain  Result Date: 01/16/2019 INDICATION: 59 year old with right lung mass and numerous liver lesions. Findings are suggestive for metastatic disease and tissue diagnosis is needed. EXAM: ULTRASOUND-GUIDED LIVER LESION BIOPSY MEDICATIONS: None. ANESTHESIA/SEDATION: Moderate (conscious) sedation was employed during this procedure. A total of Versed 2.0 mg and Fentanyl 100 mcg was administered intravenously. Moderate Sedation Time: 20 minutes. The patient's level of consciousness and vital signs were monitored continuously by radiology nursing throughout the procedure under my direct supervision. FLUOROSCOPY TIME:  None COMPLICATIONS: None immediate. PROCEDURE: Informed written consent was obtained from the patient after a thorough discussion of the procedural risks, benefits and alternatives. All questions were addressed. Maximal Sterile Barrier Technique was utilized including caps, mask, sterile gowns, sterile gloves, sterile drape, hand hygiene and skin antiseptic. A timeout was performed prior to the initiation of the procedure. Liver was evaluated with ultrasound. Large lesion in the left hepatic lobe was targeted. The anterior abdomen was prepped with chlorhexidine and sterile field was created. Skin and soft tissues were anesthetized with 1% lidocaine. Using ultrasound guidance, 17 gauge coaxial needle was directed into the left hepatic lobe and large lesion. Three core biopsies were obtained with an 18 gauge core device. Specimens placed in formalin. Needle was removed without complication. Bandage placed over the puncture site. FINDINGS: Numerous lesions scattered throughout the liver. A large lesion in the medial left hepatic lobe was biopsied. No significant bleeding or hematoma formation following the core biopsies. Three adequate core biopsies were obtained. IMPRESSION: Ultrasound-guided core biopsies of a left hepatic lesion.  Electronically Signed   By: Markus Daft M.D.   On: 01/16/2019 12:39   DG Chest Port 1V today  Result Date: 12/30/2018 CLINICAL DATA:  Shortness of breath.  COVID-19 pneumonia. EXAM: PORTABLE CHEST 1 VIEW COMPARISON:  12/29/2018-[facility] FINDINGS: Midline trachea. Mild cardiomegaly. Similar small right pleural effusion. No pneumothorax. Slightly worsened aeration, with bilateral interstitial opacities. Slight increase in right mid and lower lung opacification. Numerous leads and wires project over the chest. IMPRESSION: Minimal progression of multifocal, bilateral pneumonia. Similar small right pleural effusion. Electronically Signed   By: Abigail Miyamoto M.D.   On: 12/30/2018 12:36   ECHOCARDIOGRAM COMPLETE  Result Date: 01/02/2019   ECHOCARDIOGRAM REPORT   Patient Name:   [facility] Date of Exam: 01/02/2019 Medical Rec #:  [id number]      Height:       62.0 in Accession #:    [id number]     Weight:       159.8 lb Date of Birth:  11-12-1960     BSA:  1.74 m Patient Age:    56 years       BP:           154/91 mmHg Patient Gender: F              HR:           65 bpm. Exam Location:  Inpatient Procedure: 2D Echo, Color Doppler and Cardiac Doppler Indications:    R07.9* Chest pain, unspecified  History:        Patient has no prior history of Echocardiogram examinations.                 CHF, CAD; Risk Factors:Hypertension and Diabetes. Verbal history                 of Echocardiogram in [facility].  Sonographer:    Raquel Sarna Senior RDCS Referring Phys: [id number] Nikolai  Sonographer Comments: Technically difficult study due to poor echo windows. Inpatient at [facility]  1. Left ventricular ejection fraction, by visual estimation, is 35 to 40%. The left ventricle has severely decreased function. There is mildly increased left ventricular hypertrophy.  2. Left ventricular diastolic parameters are consistent with Grade I diastolic dysfunction (impaired relaxation).  3. The left  ventricle demonstrates global hypokinesis.  4. Global right ventricle has low normal systolic function.The right ventricular size is normal. No increase in right ventricular wall thickness.  5. Left atrial size was normal.  6. Right atrial size was normal.  7. The mitral valve is abnormal. Trivial mitral valve regurgitation.  8. The tricuspid valve is not well visualized.  9. The aortic valve was not well visualized. Aortic valve regurgitation is mild. 10. The pulmonic valve was not well visualized. Pulmonic valve regurgitation is not visualized. 11. The inferior vena cava is normal in size with greater than 50% respiratory variability, suggesting right atrial pressure of 3 mmHg. 12. The interatrial septum was not well visualized. FINDINGS  Left Ventricle: Left ventricular ejection fraction, by visual estimation, is 35 to 40%. The left ventricle has severely decreased function. The left ventricle demonstrates global hypokinesis. There is mildly increased left ventricular hypertrophy. Left ventricular diastolic parameters are consistent with Grade I diastolic dysfunction (impaired relaxation). Indeterminate filling pressures. Right Ventricle: The right ventricular size is normal. No increase in right ventricular wall thickness. Global RV systolic function is has low normal systolic function. Left Atrium: Left atrial size was normal in size. Right Atrium: Right atrial size was normal in size Pericardium: There is no evidence of pericardial effusion. Mitral Valve: The mitral valve is abnormal. There is mild thickening of the mitral valve leaflet(s). Trivial mitral valve regurgitation. Tricuspid Valve: The tricuspid valve is not well visualized. Tricuspid valve regurgitation is trivial. Aortic Valve: The aortic valve was not well visualized. Aortic valve regurgitation is mild. Aortic regurgitation PHT measures 1207 msec. Pulmonic Valve: The pulmonic valve was not well visualized. Pulmonic valve regurgitation is not  visualized. Pulmonic regurgitation is not visualized. Aorta: The aortic root and ascending aorta are structurally normal, with no evidence of dilitation. Venous: The inferior vena cava is normal in size with greater than 50% respiratory variability, suggesting right atrial pressure of 3 mmHg. IAS/Shunts: The interatrial septum was not well visualized.  LEFT VENTRICLE PLAX 2D LVIDd:         4.22 cm       Diastology LVIDs:         3.67 cm       LV e' lateral:  4.13 cm/s LV PW:         0.96 cm       LV E/e' lateral: 13.0 LV IVS:        1.06 cm       LV e' medial:    3.92 cm/s LVOT diam:     1.90 cm       LV E/e' medial:  13.7 LV SV:         22 ml LV SV Index:   12.45 LVOT Area:     2.84 cm  LV Volumes (MOD) LV area d, A2C:    36.60 cm LV area d, A4C:    40.90 cm LV area s, A2C:    27.00 cm LV area s, A4C:    30.90 cm LV major d, A2C:   8.77 cm LV major d, A4C:   9.01 cm LV major s, A2C:   8.11 cm LV major s, A4C:   8.24 cm LV vol d, MOD A2C: 127.0 ml LV vol d, MOD A4C: 153.0 ml LV vol s, MOD A2C: 76.9 ml LV vol s, MOD A4C: 98.9 ml LV SV MOD A2C:     50.1 ml LV SV MOD A4C:     153.0 ml LV SV MOD BP:      53.5 ml RIGHT VENTRICLE RV S prime:     13.20 cm/s TAPSE (M-mode): 2.6 cm LEFT ATRIUM             Index       RIGHT ATRIUM           Index LA diam:        3.30 cm 1.90 cm/m  RA Area:     10.10 cm LA Vol (A2C):   48.1 ml 27.69 ml/m RA Volume:   19.80 ml  11.40 ml/m LA Vol (A4C):   57.2 ml 32.93 ml/m LA Biplane Vol: 53.5 ml 30.80 ml/m  AORTIC VALVE LVOT Vmax:   119.00 cm/s LVOT Vmean:  79.200 cm/s LVOT VTI:    0.200 m AI PHT:      1207 msec  AORTA Ao Root diam: 3.10 cm Ao Asc diam:  3.20 cm MITRAL VALVE MV Area (PHT): 3.65 cm             SHUNTS MV PHT:        60.32 msec           Systemic VTI:  0.20 m MV Decel Time: 208 msec             Systemic Diam: 1.90 cm MV E velocity: 53.80 cm/s 103 cm/s MV A velocity: 64.00 cm/s 70.3 cm/s MV E/A ratio:  0.84       1.5  Lyman Bishop MD Electronically signed by Lyman Bishop MD Signature Date/Time: 01/02/2019/11:02:11 AM    Final    US Abdomen Limited RUQ  Result Date: 01/15/2019 CLINICAL DATA:  Elevated liver enzymes.  Hepatic metastatic disease EXAM: ULTRASOUND ABDOMEN LIMITED RIGHT UPPER QUADRANT COMPARISON:  CT 01/03/2019 FINDINGS: Gallbladder: No gallstones or wall thickening visualized. No sonographic Murphy sign noted by sonographer. Common bile duct: Diameter: 6 mm Liver: Numerous solid hepatic masses of varying sizes. Masses are predominantly echogenic with a hypoechoic halo. Background echogenicity is coarsened. Portal vein is patent on color Doppler imaging with normal direction of blood flow towards the liver. Other: None. IMPRESSION: 1. Numerous solid masses throughout the liver compatible with hepatic metastatic disease. 2. Unremarkable appearance of the gallbladder. Electronically Signed  By: Davina Poke D.O.   On: 01/15/2019 17:03      Discharge Exam: Vitals:   01/17/19 1005 01/17/19 1320  BP:  104/71  Pulse:  72  Resp:  18  Temp:  98.5 F (36.9 C)  SpO2: (!) 89% 97%    GENERAL: Chronically ill-appearing.  No apparent distress. HEENT: MMM.  Vision and hearing grossly intact.  NECK: Supple.  No apparent JVD.  RESP: On 2 L.  No IWOB.  Diminished aeration on the right.  Rhonchi on the left. CVS:  RRR. Heart sounds normal.  ABD/GI/GU: Bowel sounds present. Soft.  Mild diffuse tenderness. MSK/EXT:  Moves extremities. No apparent deformity or edema.  SKIN: no apparent skin lesion or wound NEURO: Awake, alert and oriented appropriately.  No apparent focal neuro deficit. PSYCH: Calm. Normal affect.   The results of significant diagnostics from this hospitalization (including imaging, microbiology, ancillary and laboratory) are listed below for reference.     Microbiology: No results found for this or any previous visit (from the past 240 hour(s)).   Labs: BNP (last 3 results) Recent Labs    12/30/18 1110 01/06/19 0550  01/08/19 0140  BNP 398.8* 226.4* 932.6*   Basic Metabolic Panel: Recent Labs  Lab 01/12/19 0515 01/12/19 0515 01/13/19 0237 01/14/19 0532 01/15/19 0441 01/16/19 0145 01/17/19 0058  NA 140   < > 142 142 144 143 144  K 2.6*   < > 4.8 3.7 3.3* 3.2* 3.1*  CL 93*   < > 99 95* 96* 93* 90*  CO2 36*   < > 34* 36* 36* 38* 38*  GLUCOSE 100*   < > 105* 97 94 158* 144*  BUN 24*   < > 27* 27* 28* 27* 29*  CREATININE 0.71   < > 0.75 0.86 0.89 0.85 0.90  CALCIUM 8.0*   < > 8.5* 8.2* 8.1* 8.2* 8.3*  MG 1.8   < > 2.2 2.0 2.0 2.0 1.9  PHOS 3.1  --  1.8* 3.4 3.3  --  1.7*   < > = values in this interval not displayed.   Liver Function Tests: Recent Labs  Lab 01/13/19 0237 01/14/19 0532 01/15/19 0441 01/16/19 0145 01/17/19 0058  AST 124* 125* 140* 154* 187*  ALT 72* 82* 83* 94* 109*  ALKPHOS 319* 332* 322* 348* 423*  BILITOT 0.6 0.4 0.6 0.4 0.7  PROT 5.4* 5.5* 5.5* 5.4* 5.8*  ALBUMIN 2.1* 2.2* 2.1* 2.1* 2.3*   Recent Labs  Lab 01/12/19 0204 01/13/19 0237  LIPASE 104* 81*   No results for input(s): AMMONIA in the last 168 hours. CBC: Recent Labs  Lab 01/13/19 0237 01/14/19 0532 01/15/19 0441 01/16/19 0145 01/17/19 0058  WBC 11.2* 11.4* 8.7 7.4 8.0  NEUTROABS 8.5* 8.2* 6.4 5.5 5.6  HGB 9.6* 9.6* 9.1* 8.5* 9.5*  HCT 32.7* 32.6* 30.8* 29.1* 32.8*  MCV 87.0 87.6 87.7 88.4 87.5  PLT 136* 130* 111* 112* 119*   Cardiac Enzymes: Recent Labs  Lab 01/14/19 0532  CKTOTAL 203   BNP: Invalid input(s): POCBNP CBG: Recent Labs  Lab 01/17/19 0056 01/17/19 0412 01/17/19 0800 01/17/19 1200 01/17/19 1724  GLUCAP 140* 142* 114* 239* 137*   D-Dimer No results for input(s): DDIMER in the last 72 hours. Hgb A1c No results for input(s): HGBA1C in the last 72 hours. Lipid Profile No results for input(s): CHOL, HDL, LDLCALC, TRIG, CHOLHDL, LDLDIRECT in the last 72 hours. Thyroid function studies No results for input(s): TSH, T4TOTAL, T3FREE, THYROIDAB in the  last 72  hours.  Invalid input(s): FREET3 Anemia work up No results for input(s): VITAMINB12, FOLATE, FERRITIN, TIBC, IRON, RETICCTPCT in the last 72 hours. Urinalysis No results found for: COLORURINE, APPEARANCEUR, LABSPEC, PHURINE, GLUCOSEU, HGBUR, BILIRUBINUR, KETONESUR, PROTEINUR, UROBILINOGEN, NITRITE, LEUKOCYTESUR Sepsis Labs Invalid input(s): PROCALCITONIN,  WBC,  LACTICIDVEN   Time coordinating discharge: 55 minutes  SIGNED:  Mercy Riding, MD  [facility] 01/17/2019, 5:52 PM  If 7PM-7AM, please contact night-coverage [email or web address] Password [email or web address]

## 2019-01-17 NOTE — Progress Notes (Signed)
Daughter informed nurse unable to get all meds filled. Concerned that pain medication needed to authorized by Medicaid. Attempted to call Dr Cyndia Skeeters. Awaiting callback.

## 2019-01-17 NOTE — Consult Note (Signed)
Responded to consult re: request for advanced directive. Pt unavailable. Chaplain will check back Monday when witnesses are available.   Rev. Eloise Levels Chaplain

## 2019-01-17 NOTE — Progress Notes (Signed)
No response from MD.

## 2019-01-17 NOTE — Progress Notes (Signed)
Paged Dr Silas [place].

## 2019-01-18 LAB — CBC WITH DIFFERENTIAL/PLATELET
Abs Immature Granulocytes: 0.42 10*3/uL — ABNORMAL HIGH (ref 0.00–0.07)
Basophils Absolute: 0.1 10*3/uL (ref 0.0–0.1)
Basophils Relative: 1 %
Eosinophils Absolute: 0 10*3/uL (ref 0.0–0.5)
Eosinophils Relative: 1 %
HCT: 29.8 % — ABNORMAL LOW (ref 36.0–46.0)
Hemoglobin: 9 g/dL — ABNORMAL LOW (ref 12.0–15.0)
Immature Granulocytes: 7 %
Lymphocytes Relative: 13 %
Lymphs Abs: 0.8 10*3/uL (ref 0.7–4.0)
MCH: 26.2 pg (ref 26.0–34.0)
MCHC: 30.2 g/dL (ref 30.0–36.0)
MCV: 86.6 fL (ref 80.0–100.0)
Monocytes Absolute: 0.4 10*3/uL (ref 0.1–1.0)
Monocytes Relative: 6 %
Neutro Abs: 4.8 10*3/uL (ref 1.7–7.7)
Neutrophils Relative %: 72 %
Platelets: 87 10*3/uL — ABNORMAL LOW (ref 150–400)
RBC: 3.44 MIL/uL — ABNORMAL LOW (ref 3.87–5.11)
RDW: 17.2 % — ABNORMAL HIGH (ref 11.5–15.5)
WBC: 6.5 10*3/uL (ref 4.0–10.5)
nRBC: 1.2 % — ABNORMAL HIGH (ref 0.0–0.2)

## 2019-01-18 LAB — GLUCOSE, CAPILLARY
Glucose-Capillary: 100 mg/dL — ABNORMAL HIGH (ref 70–99)
Glucose-Capillary: 112 mg/dL — ABNORMAL HIGH (ref 70–99)
Glucose-Capillary: 131 mg/dL — ABNORMAL HIGH (ref 70–99)
Glucose-Capillary: 152 mg/dL — ABNORMAL HIGH (ref 70–99)
Glucose-Capillary: 191 mg/dL — ABNORMAL HIGH (ref 70–99)

## 2019-01-18 LAB — COMPREHENSIVE METABOLIC PANEL
ALT: 90 U/L — ABNORMAL HIGH (ref 0–44)
AST: 138 U/L — ABNORMAL HIGH (ref 15–41)
Albumin: 2.2 g/dL — ABNORMAL LOW (ref 3.5–5.0)
Alkaline Phosphatase: 352 U/L — ABNORMAL HIGH (ref 38–126)
Anion gap: 16 — ABNORMAL HIGH (ref 5–15)
BUN: 29 mg/dL — ABNORMAL HIGH (ref 6–20)
CO2: 37 mmol/L — ABNORMAL HIGH (ref 22–32)
Calcium: 8 mg/dL — ABNORMAL LOW (ref 8.9–10.3)
Chloride: 87 mmol/L — ABNORMAL LOW (ref 98–111)
Creatinine, Ser: 0.78 mg/dL (ref 0.44–1.00)
GFR calc Af Amer: >60 mL/min (ref >60)
GFR calc non Af Amer: >60 mL/min (ref >60)
Glucose, Bld: 118 mg/dL — ABNORMAL HIGH (ref 70–99)
Potassium: 2.5 mmol/L — CL (ref 3.5–5.1)
Sodium: 140 mmol/L (ref 135–145)
Total Bilirubin: 0.7 mg/dL (ref 0.3–1.2)
Total Protein: 5.5 g/dL — ABNORMAL LOW (ref 6.5–8.1)

## 2019-01-18 LAB — PHOSPHORUS: Phosphorus: 4.1 mg/dL (ref 2.5–4.6)

## 2019-01-18 LAB — MAGNESIUM: Magnesium: 1.8 mg/dL (ref 1.7–2.4)

## 2019-01-18 LAB — POTASSIUM
Potassium: 2.9 mmol/L — ABNORMAL LOW (ref 3.5–5.1)
Potassium: 3.6 mmol/L (ref 3.5–5.1)

## 2019-01-18 MED ORDER — POTASSIUM CHLORIDE CRYS ER 20 MEQ PO TBCR
40.0000 meq | EXTENDED_RELEASE_TABLET | ORAL | Status: AC
  Administered 2019-01-18 (×2): 40 meq via ORAL
  Filled 2019-01-18 (×2): qty 2

## 2019-01-18 MED ORDER — POTASSIUM CHLORIDE CRYS ER 20 MEQ PO TBCR: 40.0000 meq | EXTENDED_RELEASE_TABLET | ORAL | Status: DC

## 2019-01-18 MED ORDER — MORPHINE SULFATE ER 30 MG PO TBCR: 30.0000 mg | EXTENDED_RELEASE_TABLET | Freq: Two times a day (BID) | ORAL | 0 refills | Status: AC

## 2019-01-18 MED ORDER — POTASSIUM CHLORIDE CRYS ER 20 MEQ PO TBCR
40.0000 meq | EXTENDED_RELEASE_TABLET | Freq: Two times a day (BID) | ORAL | Status: DC
  Administered 2019-01-18: 40 meq via ORAL
  Filled 2019-01-18: qty 2

## 2019-01-18 MED ORDER — POTASSIUM CHLORIDE CRYS ER 20 MEQ PO TBCR
40.0000 meq | EXTENDED_RELEASE_TABLET | ORAL | Status: DC
  Administered 2019-01-18: 40 meq via ORAL
  Filled 2019-01-18: qty 2

## 2019-01-18 MED ORDER — MAGNESIUM SULFATE 2 GM/50ML IV SOLN
2.0000 g | Freq: Once | INTRAVENOUS | Status: AC
  Administered 2019-01-18: 2 g via INTRAVENOUS
  Filled 2019-01-18: qty 50

## 2019-01-18 NOTE — TOC Initial Note (Signed)
Transition of Care [facility]) - Initial/Assessment Note    Patient Details  Name: Emily Thomas MRN: [id number] Date of Birth: 1960-01-16  Transition of Care (TOC) CM/SW Contact:    Joaquin Courts, RN Phone Number: 01/18/2019, 1:10 PM  Clinical Narrative:      CM attempted to make arrangements for [facility] services for patient, all agencies have declined, stating a combination of reasons including staffing and insurance. CM is unable to find home health services for patient.  CM notified MD of this fact and spoke with patient's daughter. Daughter reports she intends for her mom to come home with her abd has prepared a room for her in her home and has purchased a hospital bed. Daughter's spouse is in school full time and is able to be home in the mornings while the daughter is at work and daughter is able to work from home some days as well.  Daughter understands that there will not be [place] services. [employer] is arranged to deliver home O2, rolling walker, and 3-in-1.  [employer] reports they have a shortage of concentrators and will be providing a portable concentrator to patient until the stationary unit can be delivered later in the week. Daughter is aware of this. Daughter reports that patient also will have an appointment with oncology to discuss treatment options.  CM discussed further resources available in community such as pcs services through [facility] which patient can apply for with the help of her pcp. Additionally, should patient decide that she does not wish to pursue oncology treatment, then perhaps hospice services may be appropriate. Daughter will explore community services after patient has had her oncology appointment and her treatment options have been discussed.                 Expected Discharge Plan: Home/Self Care Barriers to Discharge: No Barriers Identified   Patient Goals and CMS Choice Patient states their goals for this hospitalization and ongoing recovery are:: to go home CMS  Medicare.gov Compare Post Acute Care list provided to:: Patient Choice offered to / list presented to : Patient  Expected Discharge Plan and Services Expected Discharge Plan: Home/Self Care   Discharge Planning Services: CM Consult Post Acute Care Choice: [place] arrangements for the past 2 months: Single Family Home Expected Discharge Date: 01/18/19               DME Arranged: Oxygen, Walker rolling, 3-N-1 DME Agency: [employer] Date DME Agency Contacted: 01/18/19 Time DME Agency Contacted: [phone] Representative spoke with at DME Agency: [place] Arrangements/Services Living arrangements for the past 2 months: [place] with:: Adult Children Patient language and need for interpreter reviewed:: Yes Do you feel safe going back to the place where you live?: Yes      Need for Family Participation in Patient Care: Yes (Comment) Care giver support system in place?: Yes (comment)   Criminal Activity/Legal Involvement Pertinent to Current Situation/Hospitalization: No - Comment as needed  Activities of Daily Living Home Assistive Devices/Equipment: None ADL Screening (condition at time of admission) Patient's cognitive ability adequate to safely complete daily activities?: Yes Is the patient deaf or have difficulty hearing?: No Does the patient have difficulty seeing, even when wearing glasses/contacts?: No Does the patient have difficulty concentrating, remembering, or making decisions?: No Patient able to express need for assistance with ADLs?: Yes Does the patient have difficulty dressing or bathing?: No Independently performs  ADLs?: Yes (appropriate for developmental age) Does the patient have difficulty walking or climbing stairs?: No Weakness of Legs: None Weakness of Arms/Hands: None  Permission Sought/Granted                  Emotional Assessment           Psych Involvement: No (comment)  Admission diagnosis:   Acute respiratory failure with hypoxemia ([place]) [J96.01] Patient Active Problem List   Diagnosis Date Noted  . Palliative care by specialist   . Goals of care, counseling/discussion   . Cancer related pain   . Overweight (BMI 25.0-29.9) 01/05/2019  . Hypokalemia 01/05/2019  . Acute pancreatitis 01/03/2019  . Metastatic cancer ([place]) 01/03/2019  . Drug-seeking behavior 01/02/2019  . Acute combined systolic and diastolic congestive heart failure ([place]) 01/01/2019  . CAD (coronary artery disease) 01/01/2019  . Essential hypertension 01/01/2019  . Diabetes mellitus type 2, uncontrolled, with complications ([place]) 20/23/3435  . Chronic epigastric pain 01/01/2019  . Tobacco abuse 01/01/2019  . Acute respiratory failure with hypoxemia ([place]) 12/30/2018  . Pneumonia due to COVID-19 virus 12/30/2018  . HTN (hypertension) 12/30/2018   PCP:  Patient, No Pcp Per Pharmacy:   [place], [place] - [facility] [address] [place] [address] Phone: [phone] Fax: [phone]     Social Determinants of Health (SDOH) Interventions    Readmission Risk Interventions No flowsheet data found.

## 2019-01-18 NOTE — Discharge Summary (Signed)
Physician Discharge Summary  CASSADI PURDIE ZOX:[id number] DOB: Oct 14, 1960 DOA: 12/30/2018  PCP: Patient, No Pcp Per  Admit date: 12/30/2018 Discharge date: 01/18/2019  Admitted From: Home Disposition: Home  Recommendations for Outpatient Follow-up:  1. Follow up with PCP in 1 week 2. Oncology to arrange outpatient follow-up 3. Ambulatory referral to palliative care ordered. 4. Please obtain CBC/CMP/Mag at follow up 5. Please follow up on the following pending results: Pathology from liver biopsy  Home Health: None Equipment/Devices: Oxygen, rolling walker and 3 in 1 commode  Discharge Condition: Guarded prognosis CODE STATUS: Full code   Hospital Course: 59 year old female with history of combined CHF, CAD, DM-2, tobacco use disorder, DDD/chronic back pain,  remote PE not on anticoagulation, gastric ulcer/GERD and hiatal hernia presented to [place] with fatigue, myalgia and shortness of breath since he had bowel prep for EGD/colonoscopy mid December.  Diagnosed with acute respiratory failure with hypoxia due to COVID-19 pneumonia and transfer to [facility] for further care.  Upon further evaluation, found to have RLL lung cancer/mass concerning for metastasis to liver, intrathoracic lymph nodes, adrenal glands, skull, cervical and thoracic spines. Case discussed with IR who was suggested transfer to [facility] for oncology input and possible liver biopsy if patient wishes to pursue treatment.  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   Patient had liver biopsy on 01/15/2018.  Pathology pending.  Oncology to arrange outpatient follow-up and discuss treatment options after pathology result.   In regards to COVID-19 infection, patient completed 5 days of remdesivir course on 01/03/2019.  Continued on dexamethasone.  Patient was  ambulated on room air and desaturated requiring 2 L to maintain saturation above 90%.  Accordingly, she was discharged on 2 L by nasal cannula.  Patient was evaluated by PT/OT who recommended home health with 24-hour supervision, rolling walker and 3 in 1 commode.  "CM attempted to make arrangements for [facility] services for patient, all agencies have declined, stating a combination of reasons including staffing and insurance. CM is unable to find home health services for patient.  CM notified MD of this fact and spoke with patient's daughter. Daughter reports she intends for her mom to come home with her abd has prepared a room for her in her home and has purchased a hospital bed. CM discussed further resources available in community such as pcs services through [facility] which patient can apply for with the help of her pcp. Additionally, should patient decide that she does not wish to pursue oncology treatment, then perhaps hospice services may be appropriate. Daughter will explore community services after patient has had her oncology appointment and her treatment options have been discussed".  DME will be delivered by [employer].  See individual problem list below for more hospital course.  Discharge Diagnoses:  Right lower lobe lung cancer/mass concerning for metastasis to liver, intrathoracic lymph nodes, adrenal glands, cervical and thoracic spines, and skull -US guided liver biopsy on 1/14.  Pathology pending. -Oncology to arrange outpatient follow-up after pathology. -Pain medications as below. -Ambulatory referral to palliative care ordered.  Cancer-related pain -MS Contin 30 mg twice daily, oxycodone 10 mg every 8 hours as needed for breakthrough pain.  Prior authorization approved.  -Counseled on adverse side effects and judicious use of pain medication -Rx for Narcan injection -Rx for bowel regimen as below. -Narcotic database reviewed and no red  flags noted.  Acute respiratory failure with  hypoxia due to COVID-19 pneumonia: Desaturated with ambulation on room air requiring 2 L to maintain saturation above 90%. -Completed 5 days of remdesivir on 01/03/2019.  Received Decadron from 01/14/2018. -Discharged on 2 L by nasal cannula  Chronic combined CHF: Echo on 12/31 with LVEF of 35-40%, G1-DD and global hypokinesis but no other major finding.  Appears euvolemic on exam.  I&O incomplete.  Weight downtrending.  Renal function stable. -Discharged on torsemide 20 mg daily, Aldactone 100 mg daily and metoprolol XL 25 mg daily -May consider ARB or Entresto outpatient. -Recheck BMP and magnesium in 1 week.  Mildly elevated lipase/abdominal pain: No evidence of pancreatitis on imaging.  -Pain regimen as above.  Uncontrolled DM-2 with hyperglycemia: A1c 7.2% on 12/28. Recent Labs    01/18/19 0827 01/18/19 1131 01/18/19 1624  GLUCAP 112* 191* 152*  -Discharged on home medication simvastatin.  Transaminitis/elevated alkaline phosphatase: Improved. Could be due to COVID-19 or malignancy.  CK within normal.  Acute hepatitis panel negative.  RUQ ultrasound with known liver masses but no biliary issue. -Recheck CMP at follow-up.  COPD exacerbation? No history of seizures prior to admission.  Not on breathing treatments. -Encouraged tobacco cessation  History of CAD s/p PCI: Stable no anginal symptoms. -Cardiac meds as above. -Continue Plavix and statin.  Essential hypertension: Normotensive. -Cardiac meds as above  Remote history of PE: No longer on anticoagulation.  Anxiety/depression/insomnia: No SI or HI. -Discharged on Klonopin 0.5 mg twice daily and BuSpar 7.5 mg 3 times daily. -Discontinued home Ativan. -Continue home Cymbalta.  Tobacco use disorder: Smokes about a pack a day. -Encouraged cessation -Nicotine patch  Hypomagnesemia/hypophosphatemia: resolved  Hypokalemia: Replenished prior to discharge. -Reduced torsemide from 40 to 20 mg daily and increased  Aldactone from 50 to 100 mg daily to help with hypokalemia. -Check CMP in 1 week.  History of PUD/GERD: -Continue Protonix and Carafate.  Goal of care discussion: Patient with significant cardiopulmonary comorbidities as above.  Now with right lung mass concerning for metastatic lung cancer to multiple organs.  Patient wishes to pursue biopsy and aggressive treatment.  She wishes to remain full code.  Palliative care involved. -Ordered ambulatory referral to palliative care   Discharge Instructions  Discharge Instructions    MyChart COVID-19 home monitoring program   Complete by: Jan 17, 2019    Is the patient willing to use the [place] for home monitoring?: No   Amb Referral to Palliative Care   Complete by: As directed    Diet - low sodium heart healthy   Complete by: As directed    Diet Carb Modified   Complete by: As directed    Discharge instructions   Complete by: As directed    It has been a pleasure taking care of you! You were hospitalized and treated for COVID-19 infection.  You were also diagnosed with lung cancer.  Your oncologists (cancer doctors) will set up outpatient follow-up for further discussion about treatment options after pathology results.  You were given prescription for pain medications.  These medications have a lot of side effects.  We strongly encourage you to be cautious and use them as prescribed. Please review your new medication list and the directions before you take your medications.  Please follow-up with your primary care doctor in 1 week for blood work-up.   In regards to Covid infection, you are still potentially infectious for the next 7 days. We recommend you isolate yourself and  take the necessary precautions to prevent the virus from spreading.  Some of the steps to prevent the virus from spreading to others: Stay away from other and members of your household at least for 7 days. Let healthy household members care for children  and pets, if possible. If you have to care for children or pets, wash your hands often and wear a mask. If possible, stay in your own room, separate from others. Use a different bathroom.Make sure that all people in your household wash their hands well and often. Leave your house only to seek medical care. Do not use public transport. Do not travel while you are sick. Wash your hands often with soap and water for 20 seconds. If soap and water are not available, use alcohol-based hand sanitizer. Cough or sneeze into a tissue or your sleeve or elbow. Do not cough or sneeze into your hand or into the air. Wear a cloth face covering or face mask.  Return precautions: Get help or return to the hospital right away if: You have trouble breathing. You have pain or pressure in your chest. You have confusion. You have bluish lips and fingernails. You have difficulty waking from sleep. You have symptoms that get worse. These symptoms may represent a serious problem that is an emergency. Do not wait to see if the symptoms will go away. Get medical help right away. Call your local emergency services (911 in the [place] Do not drive yourself to the hospital. Let the emergency medical personnel know if you think you have COVID-19.  To protect yourself in the future:  Do not travel to areas where COVID-19 is a risk. The areas where COVID-19 is reported change often. To identify high-risk areas and travel restrictions, check the CDC travel website: FatFares.com.br If you live in, or must travel to, an area where COVID-19 is a risk, take precautions to avoid infection. Stay away from people who are sick. Wash your hands often with soap and water for 20 seconds. If soap and water are not available, use an alcohol-based hand sanitizer. Avoid touching your mouth, face, eyes, or nose. Avoid going out in public, follow guidance from your state and local health authorities. If you must go out in public,  wear a cloth face covering or face mask. Disinfect objects and surfaces that are frequently touched every day. This may include: Counters and tables. Doorknobs and light switches. Sinks and faucets. Electronics, such as phones, remote controls, keyboards, computers, and tablets.    Where to find more information Centers for Disease Control and Prevention: PurpleGadgets.be World Health Organization: https://www.castaneda.info/    Take care,   Increase activity slowly   Complete by: As directed    Increase activity slowly   Complete by: As directed    MyChart COVID-19 home monitoring program   Complete by: Jan 18, 2019    Is the patient willing to use the [place] for home monitoring?: No     Allergies as of 01/18/2019      Reactions   Tetracyclines & Related Rash   Many years ago      Medication List    STOP taking these medications   BC HEADACHE POWDER PO   LORazepam 1 MG tablet Commonly known as: ATIVAN     TAKE these medications   busPIRone 7.5 MG tablet Commonly known as: BUSPAR Take 1 tablet (7.5 mg total) by mouth 3 (three) times daily.   clonazePAM 0.5 MG tablet Commonly known as: KlonoPIN  Take 1 tablet (0.5 mg total) by mouth 2 (two) times daily.   clopidogrel 75 MG tablet Commonly known as: PLAVIX Take 75 mg by mouth every evening.   DULoxetine 30 MG capsule Commonly known as: CYMBALTA Take 1 capsule (30 mg total) by mouth daily.   feeding supplement (ENSURE ENLIVE) Liqd Take 237 mLs by mouth 2 (two) times daily between meals.   lisinopril 10 MG tablet Commonly known as: ZESTRIL Take 10 mg by mouth daily.   metFORMIN 500 MG tablet Commonly known as: GLUCOPHAGE Take 500 mg by mouth daily with breakfast.   metoprolol succinate 50 MG 24 hr tablet Commonly known as: TOPROL-XL Take 50 mg by mouth at bedtime.   morphine 30 MG 12 hr tablet Commonly known as: MS Contin Take 1 tablet (30 mg total)  by mouth every 12 (twelve) hours.   naloxone 0.4 MG/ML injection Commonly known as: NARCAN Inject 0.4mg  (29ml), repeat every 2 to 3 minutes for up to three doses if no resposnse   nicotine 21 mg/24hr patch Commonly known as: NICODERM CQ - dosed in mg/24 hours Place 1 patch (21 mg total) onto the skin daily.   Nitrostat 0.4 MG SL tablet Generic drug: nitroGLYCERIN Place 0.4 mg under the tongue every 5 (five) minutes as needed for chest pain.   ondansetron 4 MG tablet Commonly known as: ZOFRAN Take 1 tablet (4 mg total) by mouth every 6 (six) hours as needed for nausea.   Oxycodone HCl 10 MG Tabs Take 1 tablet (10 mg total) by mouth every 8 (eight) hours as needed for severe pain or breakthrough pain.   pantoprazole 40 MG tablet Commonly known as: PROTONIX Take 40 mg by mouth 2 (two) times daily as needed for heartburn.   polyethylene glycol 17 g packet Commonly known as: MIRALAX / GLYCOLAX Take 17 g by mouth daily.   rosuvastatin 20 MG tablet Commonly known as: CRESTOR Take 20 mg by mouth at bedtime.   spironolactone 100 MG tablet Commonly known as: ALDACTONE Take 1 tablet (100 mg total) by mouth daily.   sucralfate 1 GM/10ML suspension Commonly known as: CARAFATE Take 10 mLs (1 g total) by mouth 4 (four) times daily -  before meals and at bedtime.   torsemide 20 MG tablet Commonly known as: DEMADEX Take 1 tablet (20 mg total) by mouth daily.            Durable Medical Equipment  (From admission, onward)         Start     Ordered   01/17/19 1753  For home use only DME Bedside commode  Once    Comments: 3 in 1 commode  Question:  Patient needs a bedside commode to treat with the following condition  Answer:  Unsteady gait   01/17/19 1752   01/17/19 1752  For home use only DME Walker rolling  Once    Question Answer Comment  Walker: With [place]   Patient needs a walker to treat with the following condition Unsteady gait      01/17/19 1751    01/17/19 1144  For home use only DME oxygen  Once    Question Answer Comment  Length of Need 6 Months   Mode or (Route) Nasal cannula   Liters per Minute 2   Frequency Continuous (stationary and portable oxygen unit needed)   Oxygen conserving device Yes   Oxygen delivery system Gas      01/17/19 1143  Consultations:  IR and oncology  Procedures/Studies:  2D Echo on 01/02/2019  1. Left ventricular ejection fraction, by visual estimation, is 35 to 40%. The left ventricle has severely decreased function. There is mildly increased left ventricular hypertrophy.  2. Left ventricular diastolic parameters are consistent with Grade I diastolic dysfunction (impaired relaxation).  3. The left ventricle demonstrates global hypokinesis.  4. Global right ventricle has low normal systolic function.The right ventricular size is normal. No increase in right ventricular wall thickness.  5. Left atrial size was normal.  6. Right atrial size was normal.  7. The mitral valve is abnormal. Trivial mitral valve regurgitation.  8. The tricuspid valve is not well visualized.  9. The aortic valve was not well visualized. Aortic valve regurgitation is mild. 10. The pulmonic valve was not well visualized. Pulmonic valve regurgitation is not visualized. 11. The inferior vena cava is normal in size with greater than 50% respiratory variability, suggesting right atrial pressure of 3 mmHg. 12. The interatrial septum was not well visualized.   01/16/2019-liver biopsy by interventional radiology   CT ABDOMEN PELVIS W WO CONTRAST  Result Date: 01/03/2019 CLINICAL DATA:  Epigastric abdominal pain. COVID-19 pneumonia. Coronary artery disease. Diabetes. Tobacco abuse. Drug seeking behavior. EXAM: CT ABDOMEN AND PELVIS WITHOUT AND WITH CONTRAST TECHNIQUE: Multidetector CT imaging of the abdomen and pelvis was performed following the standard protocol before and following the bolus administration of  intravenous contrast. CONTRAST:  164mL OMNIPAQUE IOHEXOL 350 MG/ML SOLN COMPARISON:  Chest radiograph 12/30/2018. Renal ultrasound of 10/14/2018. Prior CT of 03/24/1999; report not available FINDINGS: Lower chest: Patchy areas of right greater than left ground-glass opacity. Right lower and less so right middle lobe consolidation. Marked thoracic adenopathy. 2.2 cm precarinal node on 02/04. A subcarinal node measures 2.2 cm on 09/04. Right hilar adenopathy at 1.7 cm on 10/04. Obstruction of the right lower and right middle lobe bronchi. Normal heart size. Trace right pleural fluid. Lad coronary artery atherosclerosis. Hepatobiliary: Hepatomegaly at 20.4 cm craniocaudal. Multiple hepatic masses, including a segment 4A 7.5 x 5.7 cm on 23/2. Right hepatic lobe 3.4 x 3.8 cm mass on 39/2. Normal gallbladder, without biliary ductal dilatation. Pancreas: Normal, without mass or ductal dilatation. Spleen: Normal in size, without focal abnormality. Adrenals/Urinary Tract: Normal right adrenal gland. 1.4 cm left adrenal nodule with indeterminate density characteristics. Lower pole right renal 2.4 cm cyst. Bilateral too small to characterize renal lesions. No hydronephrosis. Normal urinary bladder. Stomach/Bowel: Gastric antral underdistention. Minimal motion degradation in the upper abdomen. The cecum extends into the central pelvis. Normal terminal ileum. Appendix not visualized. Periampullary duodenal diverticulum.  Probable enterotomy. Vascular/Lymphatic: Aortic and branch vessel atherosclerosis. 9 mm left periaortic node on 36/2 is upper normal sized. An aortocaval node measures 9 mm on 35/2. Gastrohepatic ligament nodes of up to 1.6 cm on 29/2. Low-density bilobed lesion or lesions within the lesser sac and posterior to the pancreas, including at 1.8 x 2.3 cm on 28/2. No pelvic sidewall adenopathy. Reproductive: Normal uterus and adnexa. Other: No significant free fluid. Moderate to marked pelvic floor laxity. No  evidence of omental or peritoneal disease. Musculoskeletal: Vague sclerotic lesions within the T10 vertebral body at 8 mm on 91 sagittal and T12 vertebral body measuring 7 mm on 86 sagittal. IMPRESSION: 1. Findings consistent with widespread metastatic disease, likely secondary to a central right lower lobe lung mass, presumably obscured by postobstructive consolidation. Findings include marked thoracic nodal adenopathy, bilateral liver metastasis, and equivocal but suspicious osseous lesions. Possible upper  abdominal nodal and left adrenal metastasis. Consider multidisciplinary thoracic oncology consultation for eventual sampling. 2. Concurrent right greater than left ground-glass opacities, likely related to COVID-19 pneumonia. 3. Small right pleural effusion. 4. Low-density lesion or lesions within the peripancreatic space and lesser sac, lymphangioma versus sequelae of prior pancreatitis. 5. Coronary artery atherosclerosis. Aortic Atherosclerosis (ICD10-I70.0). 6. Pelvic floor laxity. These results will be called to the ordering clinician or representative by the Radiologist Assistant, and communication documented in the PACS or zVision Dashboard. Electronically Signed   By: Abigail Miyamoto M.D.   On: 01/03/2019 10:31   CT HEAD WO CONTRAST  Result Date: 01/04/2019 CLINICAL DATA:  Small-cell lung cancer EXAM: CT HEAD WITHOUT CONTRAST TECHNIQUE: Contiguous axial images were obtained from the base of the skull through the vertex without intravenous contrast. COMPARISON:  None. FINDINGS: Brain: There is a soft tissue lesion identified in the posterior left occipital no with surrounding edema suspicious for metastatic disease. MRI with contrast material is recommended for further evaluation. No acute hemorrhage is seen. No acute infarct is noted. Vascular: No hyperdense vessel or unexpected calcification. Skull: Normal. Negative for fracture or focal lesion. Sinuses/Orbits: No acute finding. Other: None. IMPRESSION:  Changes in the posterior left occipital lobe suspicious for metastatic disease. MRI with contrast is recommended for further evaluation. Electronically Signed   By: Inez Catalina M.D.   On: 01/04/2019 15:43   CT CHEST WO CONTRAST  Result Date: 01/04/2019 CLINICAL DATA:  Small-cell lung cancer staging EXAM: CT CHEST WITHOUT CONTRAST TECHNIQUE: Multidetector CT imaging of the chest was performed following the standard protocol without IV contrast. COMPARISON:  CT abdomen pelvis, 01/03/2019, chest radiograph, 12/30/2018 FINDINGS: Cardiovascular: Normal heart size. Three-vessel coronary artery calcifications and/or stents. No pericardial effusion. Mediastinum/Nodes: Numerous bulky mediastinal and right hilar lymph nodes, largest pretracheal node measuring 3.9 x 2.3 cm (series 2, image 21). Thyroid gland, trachea, and esophagus demonstrate no significant findings. Lungs/Pleura: There is obstruction of the right bronchus intermedius and dense, near-total postobstructive consolidation of the right middle and right lower lobes. Underlying mass is not distinctly appreciated in the background of consolidation. There is extensive, heterogeneous and ground-glass airspace opacity throughout the lungs bilaterally. Underlying centrilobular and paraseptal emphysema at the lung apices. There is subpleural bronchiolectasis of the mid lungs (series 4, image 21). Upper Abdomen: Bulky hepatic masses, better appreciated by previous dedicated CT of the abdomen and pelvis. Musculoskeletal: Subtle sclerotic lesions, for example of the right aspect of the T2 vertebral body (series 603, image 79), central T10 vertebral body (series 603, image 81) and T12 vertebral body (series 603, image 74). IMPRESSION: 1. Occlusion of the right bronchus intermedius and dense, near-total postobstructive consolidation of the right middle and right lower lobes. Underlying mass is not distinctly appreciated in the background of consolidation however findings  are most consistent with primary lung malignancy. 2. Numerous bulky mediastinal and right hilar lymph nodes, consistent with metastatic disease. 3. Extensive, heterogeneous and ground-glass airspace opacity throughout the lungs bilaterally, consistent with reported COVID-19 infection. 4. Small right pleural effusion. 5. Underlying centrilobular and paraseptal emphysema at the lung apices. Emphysema (ICD10-J43.9). There is subpleural bronchiolectasis of the mid lungs (series 4, image 21), which may reflect prominent appearance of emphysema or alternately some degree of underlying fibrotic interstitial lung disease, difficult to evaluate due to extensive airspace disease and malignant consolidation of the right lung. 6. Bulky hepatic masses, better appreciated by previous dedicated CT of the abdomen and pelvis and consistent with abdominal metastatic disease. 7.  Subtle sclerotic lesions within the thoracic spine, concerning for osseous metastatic disease. 8. Coronary artery disease.  Aortic Atherosclerosis (ICD10-I70.0). Electronically Signed   By: Eddie Candle M.D.   On: 01/04/2019 15:53   MR BRAIN W WO CONTRAST  Result Date: 01/13/2019 CLINICAL DATA:  Brain mass or lesion. Right lower lobe lung cancer with metastatic disease to the liver, retroperitoneal nodes, and left adrenal gland. EXAM: MRI HEAD WITHOUT AND WITH CONTRAST TECHNIQUE: Multiplanar, multiecho pulse sequences of the brain and surrounding structures were obtained without and with intravenous contrast. CONTRAST:  7.15mL GADAVIST GADOBUTROL 1 MMOL/ML IV SOLN COMPARISON:  CT head without contrast 01/04/2019. MR head without and with contrast 05/24/2004 FINDINGS: Brain: No acute infarct, hemorrhage, or mass lesion is present. Mild atrophy and moderate diffuse white matter disease is markedly advanced for age. No enhancing parenchymal lesions are present. No significant extraaxial fluid collection is present. The ventricles are of normal size. The  brainstem and cerebellum are within normal limits. A remote lacunar infarct is present at the inferior right cerebellum. Vascular: Flow is present in the major intracranial arteries. Skull and upper cervical spine: There is diffuse heterogeneous marrow appearance the calvarium with scattered infiltrative enhancement suggesting widespread metastatic disease to the skull and upper cervical spine. No pathologic trick fracture is present. Left frontal craniotomy is noted. Sinuses/Orbits: The paranasal sinuses and mastoid air cells are clear. The globes and orbits are within normal limits. IMPRESSION: 1. No evidence for metastatic disease to the brain or meninges. 2. Diffuse heterogeneous marrow appearance suggesting widespread metastatic disease to the skull and upper cervical spine. 3. 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 4. Mild atrophy and moderate diffuse white matter disease is markedly advanced for age. This likely reflects the sequela of chronic microvascular ischemia. Electronically Signed   By: San Morelle M.D.   On: 01/13/2019 12:22   IR US Guide Bx Asp/Drain  Result Date: 01/16/2019 INDICATION: 59 year old with right lung mass and numerous liver lesions. Findings are suggestive for metastatic disease and tissue diagnosis is needed. EXAM: ULTRASOUND-GUIDED LIVER LESION BIOPSY MEDICATIONS: None. ANESTHESIA/SEDATION: Moderate (conscious) sedation was employed during this procedure. A total of Versed 2.0 mg and Fentanyl 100 mcg was administered intravenously. Moderate Sedation Time: 20 minutes. The patient's level of consciousness and vital signs were monitored continuously by radiology nursing throughout the procedure under my direct supervision. FLUOROSCOPY TIME:  None COMPLICATIONS: None immediate. PROCEDURE: Informed written consent was obtained from the patient after a thorough discussion of the procedural risks, benefits and alternatives. All questions were addressed.  Maximal Sterile Barrier Technique was utilized including caps, mask, sterile gowns, sterile gloves, sterile drape, hand hygiene and skin antiseptic. A timeout was performed prior to the initiation of the procedure. Liver was evaluated with ultrasound. Large lesion in the left hepatic lobe was targeted. The anterior abdomen was prepped with chlorhexidine and sterile field was created. Skin and soft tissues were anesthetized with 1% lidocaine. Using ultrasound guidance, 17 gauge coaxial needle was directed into the left hepatic lobe and large lesion. Three core biopsies were obtained with an 18 gauge core device. Specimens placed in formalin. Needle was removed without complication. Bandage placed over the puncture site. FINDINGS: Numerous lesions scattered throughout the liver. A large lesion in the medial left hepatic lobe was biopsied. No significant bleeding or hematoma formation following the core biopsies. Three adequate core biopsies were obtained. IMPRESSION: Ultrasound-guided core biopsies of a left hepatic lesion. Electronically Signed   By: Scherrie Gerlach.D.  On: 01/16/2019 12:39   DG Chest Port 1V today  Result Date: 12/30/2018 CLINICAL DATA:  Shortness of breath.  COVID-19 pneumonia. EXAM: PORTABLE CHEST 1 VIEW COMPARISON:  12/29/2018-[facility] FINDINGS: Midline trachea. Mild cardiomegaly. Similar small right pleural effusion. No pneumothorax. Slightly worsened aeration, with bilateral interstitial opacities. Slight increase in right mid and lower lung opacification. Numerous leads and wires project over the chest. IMPRESSION: Minimal progression of multifocal, bilateral pneumonia. Similar small right pleural effusion. Electronically Signed   By: Abigail Miyamoto M.D.   On: 12/30/2018 12:36   ECHOCARDIOGRAM COMPLETE  Result Date: 01/02/2019   ECHOCARDIOGRAM REPORT   Patient Name:   SHERHONDA GASPAR [facility] Date of Exam: 01/02/2019 Medical Rec #:  [id number]      Height:       62.0 in Accession #:     [id number]     Weight:       159.8 lb Date of Birth:  1960/01/31     BSA:          1.74 m Patient Age:    1 years       BP:           154/91 mmHg Patient Gender: F              HR:           65 bpm. Exam Location:  Inpatient Procedure: 2D Echo, Color Doppler and Cardiac Doppler Indications:    R07.9* Chest pain, unspecified  History:        Patient has no prior history of Echocardiogram examinations.                 CHF, CAD; Risk Factors:Hypertension and Diabetes. Verbal history                 of Echocardiogram in [facility].  Sonographer:    Raquel Sarna Senior RDCS Referring Phys: [id number] [place]  Sonographer Comments: Technically difficult study due to poor echo windows. Inpatient at [facility]  1. Left ventricular ejection fraction, by visual estimation, is 35 to 40%. The left ventricle has severely decreased function. There is mildly increased left ventricular hypertrophy.  2. Left ventricular diastolic parameters are consistent with Grade I diastolic dysfunction (impaired relaxation).  3. The left ventricle demonstrates global hypokinesis.  4. Global right ventricle has low normal systolic function.The right ventricular size is normal. No increase in right ventricular wall thickness.  5. Left atrial size was normal.  6. Right atrial size was normal.  7. The mitral valve is abnormal. Trivial mitral valve regurgitation.  8. The tricuspid valve is not well visualized.  9. The aortic valve was not well visualized. Aortic valve regurgitation is mild. 10. The pulmonic valve was not well visualized. Pulmonic valve regurgitation is not visualized. 11. The inferior vena cava is normal in size with greater than 50% respiratory variability, suggesting right atrial pressure of 3 mmHg. 12. The interatrial septum was not well visualized. FINDINGS  Left Ventricle: Left ventricular ejection fraction, by visual estimation, is 35 to 40%. The left ventricle has severely decreased function. The left ventricle  demonstrates global hypokinesis. There is mildly increased left ventricular hypertrophy. Left ventricular diastolic parameters are consistent with Grade I diastolic dysfunction (impaired relaxation). Indeterminate filling pressures. Right Ventricle: The right ventricular size is normal. No increase in right ventricular wall thickness. Global RV systolic function is has low normal systolic function. Left Atrium: Left atrial size was normal in size. Right Atrium: Right atrial  size was normal in size Pericardium: There is no evidence of pericardial effusion. Mitral Valve: The mitral valve is abnormal. There is mild thickening of the mitral valve leaflet(s). Trivial mitral valve regurgitation. Tricuspid Valve: The tricuspid valve is not well visualized. Tricuspid valve regurgitation is trivial. Aortic Valve: The aortic valve was not well visualized. Aortic valve regurgitation is mild. Aortic regurgitation PHT measures 1207 msec. Pulmonic Valve: The pulmonic valve was not well visualized. Pulmonic valve regurgitation is not visualized. Pulmonic regurgitation is not visualized. Aorta: The aortic root and ascending aorta are structurally normal, with no evidence of dilitation. Venous: The inferior vena cava is normal in size with greater than 50% respiratory variability, suggesting right atrial pressure of 3 mmHg. IAS/Shunts: The interatrial septum was not well visualized.  LEFT VENTRICLE PLAX 2D LVIDd:         4.22 cm       Diastology LVIDs:         3.67 cm       LV e' lateral:   4.13 cm/s LV PW:         0.96 cm       LV E/e' lateral: 13.0 LV IVS:        1.06 cm       LV e' medial:    3.92 cm/s LVOT diam:     1.90 cm       LV E/e' medial:  13.7 LV SV:         22 ml LV SV Index:   12.45 LVOT Area:     2.84 cm  LV Volumes (MOD) LV area d, A2C:    36.60 cm LV area d, A4C:    40.90 cm LV area s, A2C:    27.00 cm LV area s, A4C:    30.90 cm LV major d, A2C:   8.77 cm LV major d, A4C:   9.01 cm LV major s, A2C:   8.11 cm  LV major s, A4C:   8.24 cm LV vol d, MOD A2C: 127.0 ml LV vol d, MOD A4C: 153.0 ml LV vol s, MOD A2C: 76.9 ml LV vol s, MOD A4C: 98.9 ml LV SV MOD A2C:     50.1 ml LV SV MOD A4C:     153.0 ml LV SV MOD BP:      53.5 ml RIGHT VENTRICLE RV S prime:     13.20 cm/s TAPSE (M-mode): 2.6 cm LEFT ATRIUM             Index       RIGHT ATRIUM           Index LA diam:        3.30 cm 1.90 cm/m  RA Area:     10.10 cm LA Vol (A2C):   48.1 ml 27.69 ml/m RA Volume:   19.80 ml  11.40 ml/m LA Vol (A4C):   57.2 ml 32.93 ml/m LA Biplane Vol: 53.5 ml 30.80 ml/m  AORTIC VALVE LVOT Vmax:   119.00 cm/s LVOT Vmean:  79.200 cm/s LVOT VTI:    0.200 m AI PHT:      1207 msec  AORTA Ao Root diam: 3.10 cm Ao Asc diam:  3.20 cm MITRAL VALVE MV Area (PHT): 3.65 cm             SHUNTS MV PHT:        60.32 msec           Systemic VTI:  0.20 m MV Decel Time:  208 msec             Systemic Diam: 1.90 cm MV E velocity: 53.80 cm/s 103 cm/s MV A velocity: 64.00 cm/s 70.3 cm/s MV E/A ratio:  0.84       1.5  Lyman Bishop MD Electronically signed by Lyman Bishop MD Signature Date/Time: 01/02/2019/11:02:11 AM    Final    US Abdomen Limited RUQ  Result Date: 01/15/2019 CLINICAL DATA:  Elevated liver enzymes.  Hepatic metastatic disease EXAM: ULTRASOUND ABDOMEN LIMITED RIGHT UPPER QUADRANT COMPARISON:  CT 01/03/2019 FINDINGS: Gallbladder: No gallstones or wall thickening visualized. No sonographic Murphy sign noted by sonographer. Common bile duct: Diameter: 6 mm Liver: Numerous solid hepatic masses of varying sizes. Masses are predominantly echogenic with a hypoechoic halo. Background echogenicity is coarsened. Portal vein is patent on color Doppler imaging with normal direction of blood flow towards the liver. Other: None. IMPRESSION: 1. Numerous solid masses throughout the liver compatible with hepatic metastatic disease. 2. Unremarkable appearance of the gallbladder. Electronically Signed   By: Davina Poke D.O.   On: 01/15/2019 17:03       Discharge Exam: Vitals:   01/18/19 0530 01/18/19 1451  BP: 112/79 100/69  Pulse: 68 81  Resp: 16 16  Temp:  97.6 F (36.4 C)  SpO2: 95% 96%    GENERAL: Chronically ill-appearing.  No apparent distress. HEENT: MMM.  Vision and hearing grossly intact.  NECK: Supple.  No apparent JVD.  RESP:  No IWOB.  Diminished aeration on the right.  Rhonchi in the left. CVS:  RRR. Heart sounds normal.  ABD/GI/GU: Bowel sounds present. Soft.  Mild diffuse tenderness. MSK/EXT:  Moves extremities. No apparent deformity. No edema.  SKIN: no apparent skin lesion or wound NEURO: Awake, alert and oriented appropriately.  No apparent focal neuro deficit. PSYCH: Calm. Normal affect.     The results of significant diagnostics from this hospitalization (including imaging, microbiology, ancillary and laboratory) are listed below for reference.     Microbiology: No results found for this or any previous visit (from the past 240 hour(s)).   Labs: BNP (last 3 results) Recent Labs    12/30/18 1110 01/06/19 0550 01/08/19 0140  BNP 398.8* 226.4* 355.7*   Basic Metabolic Panel: Recent Labs  Lab 01/13/19 0237 01/13/19 0237 01/14/19 0532 01/14/19 0532 01/15/19 0441 01/16/19 0145 01/17/19 0058 01/18/19 0231 01/18/19 1312  NA 142   < > 142  --  144 143 144 140  --   K 4.8   < > 3.7   < > 3.3* 3.2* 3.1* 2.5* 2.9*  CL 99   < > 95*  --  96* 93* 90* 87*  --   CO2 34*   < > 36*  --  36* 38* 38* 37*  --   GLUCOSE 105*   < > 97  --  94 158* 144* 118*  --   BUN 27*   < > 27*  --  28* 27* 29* 29*  --   CREATININE 0.75   < > 0.86  --  0.89 0.85 0.90 0.78  --   CALCIUM 8.5*   < > 8.2*  --  8.1* 8.2* 8.3* 8.0*  --   MG 2.2   < > 2.0  --  2.0 2.0 1.9 1.8  --   PHOS 1.8*  --  3.4  --  3.3  --  1.7* 4.1  --    < > = values in this interval not displayed.  Liver Function Tests: Recent Labs  Lab 01/14/19 0532 01/15/19 0441 01/16/19 0145 01/17/19 0058 01/18/19 0231  AST 125* 140* 154* 187*  138*  ALT 82* 83* 94* 109* 90*  ALKPHOS 332* 322* 348* 423* 352*  BILITOT 0.4 0.6 0.4 0.7 0.7  PROT 5.5* 5.5* 5.4* 5.8* 5.5*  ALBUMIN 2.2* 2.1* 2.1* 2.3* 2.2*   Recent Labs  Lab 01/12/19 0204 01/13/19 0237  LIPASE 104* 81*   No results for input(s): AMMONIA in the last 168 hours. CBC: Recent Labs  Lab 01/14/19 0532 01/15/19 0441 01/16/19 0145 01/17/19 0058 01/18/19 0231  WBC 11.4* 8.7 7.4 8.0 6.5  NEUTROABS 8.2* 6.4 5.5 5.6 4.8  HGB 9.6* 9.1* 8.5* 9.5* 9.0*  HCT 32.6* 30.8* 29.1* 32.8* 29.8*  MCV 87.6 87.7 88.4 87.5 86.6  PLT 130* 111* 112* 119* 87*   Cardiac Enzymes: Recent Labs  Lab 01/14/19 0532  CKTOTAL 203   BNP: Invalid input(s): POCBNP CBG: Recent Labs  Lab 01/18/19 0001 01/18/19 0356 01/18/19 0827 01/18/19 1131 01/18/19 1624  GLUCAP 131* 100* 112* 191* 152*   D-Dimer No results for input(s): DDIMER in the last 72 hours. Hgb A1c No results for input(s): HGBA1C in the last 72 hours. Lipid Profile No results for input(s): CHOL, HDL, LDLCALC, TRIG, CHOLHDL, LDLDIRECT in the last 72 hours. Thyroid function studies No results for input(s): TSH, T4TOTAL, T3FREE, THYROIDAB in the last 72 hours.  Invalid input(s): FREET3 Anemia work up No results for input(s): VITAMINB12, FOLATE, FERRITIN, TIBC, IRON, RETICCTPCT in the last 72 hours. Urinalysis No results found for: COLORURINE, APPEARANCEUR, LABSPEC, PHURINE, GLUCOSEU, HGBUR, BILIRUBINUR, KETONESUR, PROTEINUR, UROBILINOGEN, NITRITE, LEUKOCYTESUR Sepsis Labs Invalid input(s): PROCALCITONIN,  WBC,  LACTICIDVEN   Time coordinating discharge: 55 minutes  SIGNED:  Mercy Riding, MD  [facility] 01/18/2019, 4:36 PM  If 7PM-7AM, please contact night-coverage [email or web address] Password [email or web address]

## 2019-01-18 NOTE — Progress Notes (Signed)
Assessment unchanged. Pt and daughter verbalized understanding of dc instructions through teach back. Discussed all medications w/verbal understanding from daughter and pt. Discharged via wc to front entrance accompanied by nurse and 3 NT's. Showed daughter how to use and set pt's O2 tank and concentrator. 3 in 1 and walker also provided and sent with pt.

## 2019-01-18 NOTE — Progress Notes (Signed)
Called for critical potassium 2.5 at 0334; awaiting call back from [place], APP.  2nd attempt to call for potassium 2.5 at 0447 for [place], APP. New orders received: 40 meq oral potassium BID. Orders noted and initiated.

## 2019-01-20 ENCOUNTER — Telehealth: Payer: Self-pay | Admitting: Internal Medicine

## 2019-01-20 ENCOUNTER — Telehealth: Payer: Self-pay | Admitting: Oncology

## 2019-01-20 LAB — SURGICAL PATHOLOGY

## 2019-01-20 NOTE — Telephone Encounter (Signed)
A new pt appt has been scheduled for Emily Thomas to see Dr. Julien Nordmann on 1/20 at 2pm w/labs at 1:30pm. I attempted to call the pt, but her line was busy. I cld the pt's sister and provided the appt date and time for Emily Thomas. Aware to arrive 15 minutes early.

## 2019-01-20 NOTE — Telephone Encounter (Signed)
I received a call from pathology today.  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  I have provided this information to our thoracic oncology navigator so that we can get her scheduled as an outpatient as soon as possible.  Mikey Bussing, DNP, AGPCNP-BC, AOCNP

## 2019-01-21 ENCOUNTER — Other Ambulatory Visit: Payer: Self-pay | Admitting: Medical Oncology

## 2019-01-21 DIAGNOSIS — C7800 Secondary malignant neoplasm of unspecified lung: Secondary | ICD-10-CM

## 2019-01-22 ENCOUNTER — Telehealth: Payer: Self-pay | Admitting: Medical Oncology

## 2019-01-22 ENCOUNTER — Encounter: Payer: Self-pay | Admitting: Internal Medicine

## 2019-01-22 ENCOUNTER — Inpatient Hospital Stay: Payer: Medicaid Other

## 2019-01-22 ENCOUNTER — Telehealth: Payer: Self-pay | Admitting: *Deleted

## 2019-01-22 ENCOUNTER — Inpatient Hospital Stay: Payer: Medicaid Other | Attending: Internal Medicine | Admitting: Internal Medicine

## 2019-01-22 ENCOUNTER — Other Ambulatory Visit: Payer: Self-pay

## 2019-01-22 VITALS — BP 98/70 | HR 126 | Temp 97.8°F | Resp 17 | Ht 61.97 in | Wt 136.2 lb

## 2019-01-22 DIAGNOSIS — F17210 Nicotine dependence, cigarettes, uncomplicated: Secondary | ICD-10-CM

## 2019-01-22 DIAGNOSIS — Z720 Tobacco use: Secondary | ICD-10-CM

## 2019-01-22 DIAGNOSIS — C787 Secondary malignant neoplasm of liver and intrahepatic bile duct: Secondary | ICD-10-CM | POA: Insufficient documentation

## 2019-01-22 DIAGNOSIS — R1013 Epigastric pain: Secondary | ICD-10-CM

## 2019-01-22 DIAGNOSIS — D696 Thrombocytopenia, unspecified: Secondary | ICD-10-CM | POA: Diagnosis not present

## 2019-01-22 DIAGNOSIS — C7800 Secondary malignant neoplasm of unspecified lung: Secondary | ICD-10-CM

## 2019-01-22 DIAGNOSIS — I10 Essential (primary) hypertension: Secondary | ICD-10-CM

## 2019-01-22 DIAGNOSIS — Z86711 Personal history of pulmonary embolism: Secondary | ICD-10-CM | POA: Diagnosis not present

## 2019-01-22 DIAGNOSIS — Z5112 Encounter for antineoplastic immunotherapy: Secondary | ICD-10-CM

## 2019-01-22 DIAGNOSIS — E119 Type 2 diabetes mellitus without complications: Secondary | ICD-10-CM | POA: Insufficient documentation

## 2019-01-22 DIAGNOSIS — R634 Abnormal weight loss: Secondary | ICD-10-CM

## 2019-01-22 DIAGNOSIS — E876 Hypokalemia: Secondary | ICD-10-CM

## 2019-01-22 DIAGNOSIS — Z8616 Personal history of COVID-19: Secondary | ICD-10-CM

## 2019-01-22 DIAGNOSIS — Z5111 Encounter for antineoplastic chemotherapy: Secondary | ICD-10-CM

## 2019-01-22 DIAGNOSIS — C7989 Secondary malignant neoplasm of other specified sites: Secondary | ICD-10-CM

## 2019-01-22 DIAGNOSIS — Z7189 Other specified counseling: Secondary | ICD-10-CM

## 2019-01-22 DIAGNOSIS — C7972 Secondary malignant neoplasm of left adrenal gland: Secondary | ICD-10-CM | POA: Diagnosis not present

## 2019-01-22 DIAGNOSIS — C7951 Secondary malignant neoplasm of bone: Secondary | ICD-10-CM | POA: Insufficient documentation

## 2019-01-22 DIAGNOSIS — G8929 Other chronic pain: Secondary | ICD-10-CM

## 2019-01-22 DIAGNOSIS — C3431 Malignant neoplasm of lower lobe, right bronchus or lung: Secondary | ICD-10-CM | POA: Diagnosis not present

## 2019-01-22 DIAGNOSIS — G893 Neoplasm related pain (acute) (chronic): Secondary | ICD-10-CM

## 2019-01-22 DIAGNOSIS — Z79899 Other long term (current) drug therapy: Secondary | ICD-10-CM | POA: Insufficient documentation

## 2019-01-22 DIAGNOSIS — C3491 Malignant neoplasm of unspecified part of right bronchus or lung: Secondary | ICD-10-CM

## 2019-01-22 LAB — CMP (CANCER CENTER ONLY)
ALT: 116 U/L — ABNORMAL HIGH (ref 0–44)
AST: 198 U/L (ref 15–41)
Albumin: 2.2 g/dL — ABNORMAL LOW (ref 3.5–5.0)
Alkaline Phosphatase: 475 U/L — ABNORMAL HIGH (ref 38–126)
Anion gap: 14 (ref 5–15)
BUN: 31 mg/dL — ABNORMAL HIGH (ref 6–20)
CO2: 39 mmol/L — ABNORMAL HIGH (ref 22–32)
Calcium: 8.6 mg/dL — ABNORMAL LOW (ref 8.9–10.3)
Chloride: 92 mmol/L — ABNORMAL LOW (ref 98–111)
Creatinine: 0.95 mg/dL (ref 0.44–1.00)
GFR, Est AFR Am: >60 mL/min (ref >60)
GFR, Estimated: >60 mL/min (ref >60)
Glucose, Bld: 343 mg/dL — ABNORMAL HIGH (ref 70–99)
Potassium: 2.7 mmol/L — CL (ref 3.5–5.1)
Sodium: 145 mmol/L (ref 135–145)
Total Bilirubin: 1.9 mg/dL — ABNORMAL HIGH (ref 0.3–1.2)
Total Protein: 6.2 g/dL — ABNORMAL LOW (ref 6.5–8.1)

## 2019-01-22 LAB — CBC WITH DIFFERENTIAL (CANCER CENTER ONLY)
Abs Immature Granulocytes: 0.3 10*3/uL — ABNORMAL HIGH (ref 0.00–0.07)
Basophils Absolute: 0 10*3/uL (ref 0.0–0.1)
Basophils Relative: 1 %
Eosinophils Absolute: 0 10*3/uL (ref 0.0–0.5)
Eosinophils Relative: 1 %
HCT: 29.1 % — ABNORMAL LOW (ref 36.0–46.0)
Hemoglobin: 8.8 g/dL — ABNORMAL LOW (ref 12.0–15.0)
Immature Granulocytes: 8 %
Lymphocytes Relative: 15 %
Lymphs Abs: 0.6 10*3/uL — ABNORMAL LOW (ref 0.7–4.0)
MCH: 25.8 pg — ABNORMAL LOW (ref 26.0–34.0)
MCHC: 30.2 g/dL (ref 30.0–36.0)
MCV: 85.3 fL (ref 80.0–100.0)
Monocytes Absolute: 0.2 10*3/uL (ref 0.1–1.0)
Monocytes Relative: 6 %
Neutro Abs: 2.8 10*3/uL (ref 1.7–7.7)
Neutrophils Relative %: 69 %
Platelet Count: 78 10*3/uL — ABNORMAL LOW (ref 150–400)
RBC: 3.41 MIL/uL — ABNORMAL LOW (ref 3.87–5.11)
RDW: 17.6 % — ABNORMAL HIGH (ref 11.5–15.5)
WBC Count: 4 10*3/uL (ref 4.0–10.5)
nRBC: 4.8 % — ABNORMAL HIGH (ref 0.0–0.2)

## 2019-01-22 MED ORDER — PROCHLORPERAZINE MALEATE 10 MG PO TABS: 10.0000 mg | ORAL_TABLET | Freq: Four times a day (QID) | ORAL | 0 refills | Status: AC | PRN

## 2019-01-22 MED ORDER — POTASSIUM CHLORIDE ER 20 MEQ PO TBCR: 20.0000 meq | EXTENDED_RELEASE_TABLET | Freq: Two times a day (BID) | ORAL | 0 refills | Status: AC

## 2019-01-22 MED ORDER — OXYCODONE-ACETAMINOPHEN 5-325 MG PO TABS
2.0000 | ORAL_TABLET | Freq: Once | ORAL | Status: AC
  Administered 2019-01-22: 2 via ORAL

## 2019-01-22 MED ORDER — OXYCODONE-ACETAMINOPHEN 5-325 MG PO TABS
ORAL_TABLET | ORAL | Status: AC
  Filled 2019-01-22: qty 2

## 2019-01-22 NOTE — Telephone Encounter (Signed)
Emily Thomas is 15 minutes late to appt with Dr. Julien Nordmann.  I called to check on her but was unable to reach or leave vm message.

## 2019-01-22 NOTE — Telephone Encounter (Addendum)
CRITICAL VALUE STICKER  CRITICAL VALUE:     K+ 2.7 and  AST=198  RECEIVER (on-site recipient of call):Breeann Reposa  DATE & TIME NOTIFIED: 01/22/19 1512  MESSENGER (representative from lab):  MD NOTIFIED: Mohamed  TIME OF NOTIFICATION: 1512  RESPONSE: Provider aware and will f/u. Oral potassium sent to preferred pharmacy .  Marland Kitchen

## 2019-01-22 NOTE — Progress Notes (Signed)
START OFF PATHWAY REGIMEN - Small Cell Lung   [id number]:Carboplatin IV D1 + Durvalumab IV D1 + Etoposide IV D1-3 q21 Days (C1-4) Followed by Durvalumab IV D1 q28 Days:   Cycles 1 through 4: A cycle is every 21 days:     Durvalumab      Carboplatin      Etoposide    Cycles 5 and beyond: A cycle is every 28 days:     Durvalumab   **Always confirm dose/schedule in your pharmacy ordering system**  Patient Characteristics: Newly Diagnosed, Preoperative or Nonsurgical Candidate (Clinical Staging), First Line, Extensive Stage Therapeutic Status: Newly Diagnosed, Preoperative or Nonsurgical Candidate (Clinical Staging) AJCC T Category: cT3 AJCC N Category: cN2 AJCC M Category: cM1c AJCC 8 Stage Grouping: IVB Stage Classification: Extensive Intent of Therapy: Non-Curative / Palliative Intent, Discussed with Patient

## 2019-01-22 NOTE — Progress Notes (Signed)
[place] [phone]   [phone]  CONSULT NOTE  REFERRING PHYSICIAN: Dr. 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 female with past medical history significant for congestive heart failure, coronary artery disease, chronic back pain, degenerative disc disease, GERD, history of pulmonary embolism, hiatal hernia, COPD as well as diabetes mellitus and long history of smoking.  The patient was diagnosed with COVID-19 pneumonia in December 2019.  On January 03, 2019 she presented to the emergency department complaining of persistent epigastric pain as well as right upper quadrant pain.  She had CT scan of the abdomen pelvis performed on January 03, 2019 and showed right lower and less right middle lobe consolidation in addition to marked thoracic adenopathy including 2.2 cm precarinal node, 2.2 cm subcarinal node as well as right hilar adenopathy measuring 1.7 cm.  The patient also had hepatomegaly with multiple hepatic masses including right hepatic lobe 3.4 x 3.8 cm.  There was also suspicious 1.4 cm left adrenal nodule.  This was followed by CT scan of the chest without contrast on January 04, 2019 and it showed occlusion of the right bronchus intermedius and dense near-total postobstructive consolidation of the right middle lobe and right lower lobe.  Underlying mass was not distinctly appreciated in the background of the consolidation but these findings are most consistent with primary lung malignancy.  There was numerous bulky mediastinal and right hilar lymph nodes consistent with metastatic disease and extensive heterogeneous in ground glass airspace opacities through the lungs bilaterally consistent with the reported COVID-19 infection.  There was also bulky hepatic masses in addition to lymphadenopathy in the abdomen.  On January 16, 2019 the patient underwent  ultrasound core biopsy of one of the liver metastasis by interventional radiology and the final pathology ([id number]) was consistent with small cell carcinoma.  MRI of the brain was negative for intraparenchymal brain metastasis. The patient was referred to me today for evaluation and recommendation regarding treatment of her condition.  When seen today she is very weak.  She was complaining of a lot of abdominal pain mainly in the right upper quadrant.  She is currently on MS Contin 30 mg p.o. every 12 hours in addition to oxycodone for breakthrough pain.  She takes 2 tablets a day of the breakthrough pain medication.  She also has pain in the back and neck area.  She has shortness of breath at baseline increased with exertion but no significant chest pain or hemoptysis.  She has no nausea, vomiting, diarrhea or constipation.  She has no headache or visual changes.  She lost around 30 pounds in the last 2 months. Family history significant for mother with brain aneurysm, father died from heart attack.  Maternal grandmother had breast cancer. The patient is married and has 3 children.  She used to work as a Radio broadcast assistant.  She was accompanied today by her daughter-in-law Emily Thomas.  The patient has a history for smoking more than 1 pack/day for over 40 years and unfortunately she continues to smoke.  She has no history of alcohol but questionable drug abuse.  HPI  Past Medical History:  Diagnosis Date   CAD (coronary artery disease)    CHF (congestive heart failure) (HCC)    Chronic back pain    DDD (degenerative disc disease), lumbar    GERD (gastroesophageal reflux disease)    Hiatal  hernia    Pulmonary embolism ([place])       No family history on file.  Social History Social History   Tobacco Use   Smoking status: Former Smoker   Smokeless tobacco: Never Used  Substance Use Topics   Alcohol use: Not on file   Drug use: Not on file    Allergies  Allergen Reactions    Tetracyclines & Related Rash    Many years ago    Current Outpatient Medications  Medication Sig Dispense Refill   busPIRone (BUSPAR) 7.5 MG tablet Take 1 tablet (7.5 mg total) by mouth 3 (three) times daily. 270 tablet 1   clonazePAM (KLONOPIN) 0.5 MG tablet Take 1 tablet (0.5 mg total) by mouth 2 (two) times daily. 60 tablet 0   clopidogrel (PLAVIX) 75 MG tablet Take 75 mg by mouth every evening.     DULoxetine (CYMBALTA) 30 MG capsule Take 1 capsule (30 mg total) by mouth daily. 90 capsule 1   feeding supplement, ENSURE ENLIVE, (ENSURE ENLIVE) LIQD Take 237 mLs by mouth 2 (two) times daily between meals. 14220 mL 0   lisinopril (ZESTRIL) 10 MG tablet Take 10 mg by mouth daily.     metFORMIN (GLUCOPHAGE) 500 MG tablet Take 500 mg by mouth daily with breakfast.     metoprolol succinate (TOPROL-XL) 50 MG 24 hr tablet Take 50 mg by mouth at bedtime.     morphine (MS CONTIN) 30 MG 12 hr tablet Take 1 tablet (30 mg total) by mouth every 12 (twelve) hours. 60 tablet 0   naloxone (NARCAN) 0.4 MG/ML injection Inject 0.4mg  (12ml), repeat every 2 to 3 minutes for up to three doses if no resposnse 3 mL 0   nicotine (NICODERM CQ - DOSED IN MG/24 HOURS) 21 mg/24hr patch Place 1 patch (21 mg total) onto the skin daily. 28 patch 0   NITROSTAT 0.4 MG SL tablet Place 0.4 mg under the tongue every 5 (five) minutes as needed for chest pain.     ondansetron (ZOFRAN) 4 MG tablet Take 1 tablet (4 mg total) by mouth every 6 (six) hours as needed for nausea. 20 tablet 0   oxyCODONE 10 MG TABS Take 1 tablet (10 mg total) by mouth every 8 (eight) hours as needed for severe pain or breakthrough pain. 30 tablet 0   pantoprazole (PROTONIX) 40 MG tablet Take 40 mg by mouth 2 (two) times daily as needed for heartburn.     polyethylene glycol (MIRALAX / GLYCOLAX) 17 g packet Take 17 g by mouth daily. 14 each 0   rosuvastatin (CRESTOR) 20 MG tablet Take 20 mg by mouth at bedtime.     spironolactone  (ALDACTONE) 100 MG tablet Take 1 tablet (100 mg total) by mouth daily. 30 tablet 1   sucralfate (CARAFATE) 1 GM/10ML suspension Take 10 mLs (1 g total) by mouth 4 (four) times daily -  before meals and at bedtime. 420 mL 0   torsemide (DEMADEX) 20 MG tablet Take 1 tablet (20 mg total) by mouth daily. 90 tablet 1   No current facility-administered medications for this visit.    Review of Systems  Constitutional: positive for anorexia, fatigue and weight loss Eyes: negative Ears, nose, mouth, throat, and face: negative Respiratory: positive for dyspnea on exertion Cardiovascular: negative Gastrointestinal: positive for abdominal pain and nausea Genitourinary:negative Integument/breast: negative Hematologic/lymphatic: negative Musculoskeletal:positive for back pain, bone pain and muscle weakness Neurological: negative Behavioral/Psych: negative Endocrine: negative Allergic/Immunologic: negative  Physical Exam  OQH:UTMLY, healthy, no  distress, cachectic, ill looking and malnourished SKIN: skin color, texture, turgor are normal HEAD: Normocephalic, No masses, lesions, tenderness or abnormalities EYES: normal, PERRLA, Conjunctiva are pink and non-injected EARS: External ears normal, Canals clear OROPHARYNX:no exudate, no erythema and lips, buccal mucosa, and tongue normal  NECK: supple, no adenopathy, no JVD LYMPH:  no palpable lymphadenopathy, no hepatosplenomegaly BREAST:not examined LUNGS: clear to auscultation , and palpation HEART: regular rate & rhythm, no murmurs and no gallops ABDOMEN:normal bowel sounds and palpable hepatomegaly BACK: No CVA tenderness, Range of motion is normal EXTREMITIES:no joint deformities, effusion, or inflammation, no edema  NEURO: alert & oriented x 3 with fluent speech, no focal motor/sensory deficits  PERFORMANCE STATUS: ECOG 2  LABORATORY DATA: Lab Results  Component Value Date   WBC 6.5 01/18/2019   HGB 9.0 (L) 01/18/2019   HCT 29.8  (L) 01/18/2019   MCV 86.6 01/18/2019   PLT 87 (L) 01/18/2019      Chemistry      Component Value Date/Time   NA 140 01/18/2019 0231   K 3.6 01/18/2019 1619   CL 87 (L) 01/18/2019 0231   CO2 37 (H) 01/18/2019 0231   BUN 29 (H) 01/18/2019 0231   CREATININE 0.78 01/18/2019 0231      Component Value Date/Time   CALCIUM 8.0 (L) 01/18/2019 0231   ALKPHOS 352 (H) 01/18/2019 0231   AST 138 (H) 01/18/2019 0231   ALT 90 (H) 01/18/2019 0231   BILITOT 0.7 01/18/2019 0231       RADIOGRAPHIC STUDIES: CT ABDOMEN PELVIS W WO CONTRAST  Result Date: 01/03/2019 CLINICAL DATA:  Epigastric abdominal pain. COVID-19 pneumonia. Coronary artery disease. Diabetes. Tobacco abuse. Drug seeking behavior. EXAM: CT ABDOMEN AND PELVIS WITHOUT AND WITH CONTRAST TECHNIQUE: Multidetector CT imaging of the abdomen and pelvis was performed following the standard protocol before and following the bolus administration of intravenous contrast. CONTRAST:  128mL OMNIPAQUE IOHEXOL 350 MG/ML SOLN COMPARISON:  Chest radiograph 12/30/2018. Renal ultrasound of 10/14/2018. Prior CT of 03/24/1999; report not available FINDINGS: Lower chest: Patchy areas of right greater than left ground-glass opacity. Right lower and less so right middle lobe consolidation. Marked thoracic adenopathy. 2.2 cm precarinal node on 02/04. A subcarinal node measures 2.2 cm on 09/04. Right hilar adenopathy at 1.7 cm on 10/04. Obstruction of the right lower and right middle lobe bronchi. Normal heart size. Trace right pleural fluid. Lad coronary artery atherosclerosis. Hepatobiliary: Hepatomegaly at 20.4 cm craniocaudal. Multiple hepatic masses, including a segment 4A 7.5 x 5.7 cm on 23/2. Right hepatic lobe 3.4 x 3.8 cm mass on 39/2. Normal gallbladder, without biliary ductal dilatation. Pancreas: Normal, without mass or ductal dilatation. Spleen: Normal in size, without focal abnormality. Adrenals/Urinary Tract: Normal right adrenal gland. 1.4 cm left adrenal  nodule with indeterminate density characteristics. Lower pole right renal 2.4 cm cyst. Bilateral too small to characterize renal lesions. No hydronephrosis. Normal urinary bladder. Stomach/Bowel: Gastric antral underdistention. Minimal motion degradation in the upper abdomen. The cecum extends into the central pelvis. Normal terminal ileum. Appendix not visualized. Periampullary duodenal diverticulum.  Probable enterotomy. Vascular/Lymphatic: Aortic and branch vessel atherosclerosis. 9 mm left periaortic node on 36/2 is upper normal sized. An aortocaval node measures 9 mm on 35/2. Gastrohepatic ligament nodes of up to 1.6 cm on 29/2. Low-density bilobed lesion or lesions within the lesser sac and posterior to the pancreas, including at 1.8 x 2.3 cm on 28/2. No pelvic sidewall adenopathy. Reproductive: Normal uterus and adnexa. Other: No significant free fluid. Moderate to  marked pelvic floor laxity. No evidence of omental or peritoneal disease. Musculoskeletal: Vague sclerotic lesions within the T10 vertebral body at 8 mm on 91 sagittal and T12 vertebral body measuring 7 mm on 86 sagittal. IMPRESSION: 1. Findings consistent with widespread metastatic disease, likely secondary to a central right lower lobe lung mass, presumably obscured by postobstructive consolidation. Findings include marked thoracic nodal adenopathy, bilateral liver metastasis, and equivocal but suspicious osseous lesions. Possible upper abdominal nodal and left adrenal metastasis. Consider multidisciplinary thoracic oncology consultation for eventual sampling. 2. Concurrent right greater than left ground-glass opacities, likely related to COVID-19 pneumonia. 3. Small right pleural effusion. 4. Low-density lesion or lesions within the peripancreatic space and lesser sac, lymphangioma versus sequelae of prior pancreatitis. 5. Coronary artery atherosclerosis. Aortic Atherosclerosis (ICD10-I70.0). 6. Pelvic floor laxity. These results will be called  to the ordering clinician or representative by the Radiologist Assistant, and communication documented in the PACS or zVision Dashboard. Electronically Signed   By: Abigail Miyamoto M.D.   On: 01/03/2019 10:31   CT HEAD WO CONTRAST  Result Date: 01/04/2019 CLINICAL DATA:  Small-cell lung cancer EXAM: CT HEAD WITHOUT CONTRAST TECHNIQUE: Contiguous axial images were obtained from the base of the skull through the vertex without intravenous contrast. COMPARISON:  None. FINDINGS: Brain: There is a soft tissue lesion identified in the posterior left occipital no with surrounding edema suspicious for metastatic disease. MRI with contrast material is recommended for further evaluation. No acute hemorrhage is seen. No acute infarct is noted. Vascular: No hyperdense vessel or unexpected calcification. Skull: Normal. Negative for fracture or focal lesion. Sinuses/Orbits: No acute finding. Other: None. IMPRESSION: Changes in the posterior left occipital lobe suspicious for metastatic disease. MRI with contrast is recommended for further evaluation. Electronically Signed   By: Inez Catalina M.D.   On: 01/04/2019 15:43   CT CHEST WO CONTRAST  Result Date: 01/04/2019 CLINICAL DATA:  Small-cell lung cancer staging EXAM: CT CHEST WITHOUT CONTRAST TECHNIQUE: Multidetector CT imaging of the chest was performed following the standard protocol without IV contrast. COMPARISON:  CT abdomen pelvis, 01/03/2019, chest radiograph, 12/30/2018 FINDINGS: Cardiovascular: Normal heart size. Three-vessel coronary artery calcifications and/or stents. No pericardial effusion. Mediastinum/Nodes: Numerous bulky mediastinal and right hilar lymph nodes, largest pretracheal node measuring 3.9 x 2.3 cm (series 2, image 21). Thyroid gland, trachea, and esophagus demonstrate no significant findings. Lungs/Pleura: There is obstruction of the right bronchus intermedius and dense, near-total postobstructive consolidation of the right middle and right lower  lobes. Underlying mass is not distinctly appreciated in the background of consolidation. There is extensive, heterogeneous and ground-glass airspace opacity throughout the lungs bilaterally. Underlying centrilobular and paraseptal emphysema at the lung apices. There is subpleural bronchiolectasis of the mid lungs (series 4, image 21). Upper Abdomen: Bulky hepatic masses, better appreciated by previous dedicated CT of the abdomen and pelvis. Musculoskeletal: Subtle sclerotic lesions, for example of the right aspect of the T2 vertebral body (series 603, image 79), central T10 vertebral body (series 603, image 81) and T12 vertebral body (series 603, image 74). IMPRESSION: 1. Occlusion of the right bronchus intermedius and dense, near-total postobstructive consolidation of the right middle and right lower lobes. Underlying mass is not distinctly appreciated in the background of consolidation however findings are most consistent with primary lung malignancy. 2. Numerous bulky mediastinal and right hilar lymph nodes, consistent with metastatic disease. 3. Extensive, heterogeneous and ground-glass airspace opacity throughout the lungs bilaterally, consistent with reported COVID-19 infection. 4. Small right pleural effusion. 5. Underlying  centrilobular and paraseptal emphysema at the lung apices. Emphysema (ICD10-J43.9). There is subpleural bronchiolectasis of the mid lungs (series 4, image 21), which may reflect prominent appearance of emphysema or alternately some degree of underlying fibrotic interstitial lung disease, difficult to evaluate due to extensive airspace disease and malignant consolidation of the right lung. 6. Bulky hepatic masses, better appreciated by previous dedicated CT of the abdomen and pelvis and consistent with abdominal metastatic disease. 7. Subtle sclerotic lesions within the thoracic spine, concerning for osseous metastatic disease. 8. Coronary artery disease.  Aortic Atherosclerosis  (ICD10-I70.0). Electronically Signed   By: Eddie Candle M.D.   On: 01/04/2019 15:53   MR BRAIN W WO CONTRAST  Result Date: 01/13/2019 CLINICAL DATA:  Brain mass or lesion. Right lower lobe lung cancer with metastatic disease to the liver, retroperitoneal nodes, and left adrenal gland. EXAM: MRI HEAD WITHOUT AND WITH CONTRAST TECHNIQUE: Multiplanar, multiecho pulse sequences of the brain and surrounding structures were obtained without and with intravenous contrast. CONTRAST:  7.12mL GADAVIST GADOBUTROL 1 MMOL/ML IV SOLN COMPARISON:  CT head without contrast 01/04/2019. MR head without and with contrast 05/24/2004 FINDINGS: Brain: No acute infarct, hemorrhage, or mass lesion is present. Mild atrophy and moderate diffuse white matter disease is markedly advanced for age. No enhancing parenchymal lesions are present. No significant extraaxial fluid collection is present. The ventricles are of normal size. The brainstem and cerebellum are within normal limits. A remote lacunar infarct is present at the inferior right cerebellum. Vascular: Flow is present in the major intracranial arteries. Skull and upper cervical spine: There is diffuse heterogeneous marrow appearance the calvarium with scattered infiltrative enhancement suggesting widespread metastatic disease to the skull and upper cervical spine. No pathologic trick fracture is present. Left frontal craniotomy is noted. Sinuses/Orbits: The paranasal sinuses and mastoid air cells are clear. The globes and orbits are within normal limits. IMPRESSION: 1. No evidence for metastatic disease to the brain or meninges. 2. Diffuse heterogeneous marrow appearance suggesting widespread metastatic disease to the skull and upper cervical spine. 3. 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 4. Mild atrophy and moderate diffuse white matter disease is markedly advanced for age. This likely reflects the sequela of chronic microvascular ischemia. Electronically  Signed   By: San Morelle M.D.   On: 01/13/2019 12:22   IR US Guide Bx Asp/Drain  Result Date: 01/16/2019 INDICATION: 59 year old with right lung mass and numerous liver lesions. Findings are suggestive for metastatic disease and tissue diagnosis is needed. EXAM: ULTRASOUND-GUIDED LIVER LESION BIOPSY MEDICATIONS: None. ANESTHESIA/SEDATION: Moderate (conscious) sedation was employed during this procedure. A total of Versed 2.0 mg and Fentanyl 100 mcg was administered intravenously. Moderate Sedation Time: 20 minutes. The patient's level of consciousness and vital signs were monitored continuously by radiology nursing throughout the procedure under my direct supervision. FLUOROSCOPY TIME:  None COMPLICATIONS: None immediate. PROCEDURE: Informed written consent was obtained from the patient after a thorough discussion of the procedural risks, benefits and alternatives. All questions were addressed. Maximal Sterile Barrier Technique was utilized including caps, mask, sterile gowns, sterile gloves, sterile drape, hand hygiene and skin antiseptic. A timeout was performed prior to the initiation of the procedure. Liver was evaluated with ultrasound. Large lesion in the left hepatic lobe was targeted. The anterior abdomen was prepped with chlorhexidine and sterile field was created. Skin and soft tissues were anesthetized with 1% lidocaine. Using ultrasound guidance, 17 gauge coaxial needle was directed into the left hepatic lobe and large lesion. Three core  biopsies were obtained with an 18 gauge core device. Specimens placed in formalin. Needle was removed without complication. Bandage placed over the puncture site. FINDINGS: Numerous lesions scattered throughout the liver. A large lesion in the medial left hepatic lobe was biopsied. No significant bleeding or hematoma formation following the core biopsies. Three adequate core biopsies were obtained. IMPRESSION: Ultrasound-guided core biopsies of a left  hepatic lesion. Electronically Signed   By: Markus Daft M.D.   On: 01/16/2019 12:39   DG Chest Port 1V today  Result Date: 12/30/2018 CLINICAL DATA:  Shortness of breath.  COVID-19 pneumonia. EXAM: PORTABLE CHEST 1 VIEW COMPARISON:  12/29/2018-[facility] FINDINGS: Midline trachea. Mild cardiomegaly. Similar small right pleural effusion. No pneumothorax. Slightly worsened aeration, with bilateral interstitial opacities. Slight increase in right mid and lower lung opacification. Numerous leads and wires project over the chest. IMPRESSION: Minimal progression of multifocal, bilateral pneumonia. Similar small right pleural effusion. Electronically Signed   By: Abigail Miyamoto M.D.   On: 12/30/2018 12:36   ECHOCARDIOGRAM COMPLETE  Result Date: 01/02/2019   ECHOCARDIOGRAM REPORT   Patient Name:   Emily Thomas [facility] Date of Exam: 01/02/2019 Medical Rec #:  [id number]      Height:       62.0 in Accession #:    [id number]     Weight:       159.8 lb Date of Birth:  07-22-1960     BSA:          1.74 m Patient Age:    12 years       BP:           154/91 mmHg Patient Gender: F              HR:           65 bpm. Exam Location:  Inpatient Procedure: 2D Echo, Color Doppler and Cardiac Doppler Indications:    R07.9* Chest pain, unspecified  History:        Patient has no prior history of Echocardiogram examinations.                 CHF, CAD; Risk Factors:Hypertension and Diabetes. Verbal history                 of Echocardiogram in [facility].  Sonographer:    Raquel Sarna Senior RDCS Referring Phys: [id number] Friona  Sonographer Comments: Technically difficult study due to poor echo windows. Inpatient at [place]  1. Left ventricular ejection fraction, by visual estimation, is 35 to 40%. The left ventricle has severely decreased function. There is mildly increased left ventricular hypertrophy.  2. Left ventricular diastolic parameters are consistent with Grade I diastolic dysfunction (impaired relaxation).   3. The left ventricle demonstrates global hypokinesis.  4. Global right ventricle has low normal systolic function.The right ventricular size is normal. No increase in right ventricular wall thickness.  5. Left atrial size was normal.  6. Right atrial size was normal.  7. The mitral valve is abnormal. Trivial mitral valve regurgitation.  8. The tricuspid valve is not well visualized.  9. The aortic valve was not well visualized. Aortic valve regurgitation is mild. 10. The pulmonic valve was not well visualized. Pulmonic valve regurgitation is not visualized. 11. The inferior vena cava is normal in size with greater than 50% respiratory variability, suggesting right atrial pressure of 3 mmHg. 12. The interatrial septum was not well visualized. FINDINGS  Left Ventricle: Left ventricular ejection fraction, by visual estimation,  is 35 to 40%. The left ventricle has severely decreased function. The left ventricle demonstrates global hypokinesis. There is mildly increased left ventricular hypertrophy. Left ventricular diastolic parameters are consistent with Grade I diastolic dysfunction (impaired relaxation). Indeterminate filling pressures. Right Ventricle: The right ventricular size is normal. No increase in right ventricular wall thickness. Global RV systolic function is has low normal systolic function. Left Atrium: Left atrial size was normal in size. Right Atrium: Right atrial size was normal in size Pericardium: There is no evidence of pericardial effusion. Mitral Valve: The mitral valve is abnormal. There is mild thickening of the mitral valve leaflet(s). Trivial mitral valve regurgitation. Tricuspid Valve: The tricuspid valve is not well visualized. Tricuspid valve regurgitation is trivial. Aortic Valve: The aortic valve was not well visualized. Aortic valve regurgitation is mild. Aortic regurgitation PHT measures 1207 msec. Pulmonic Valve: The pulmonic valve was not well visualized. Pulmonic valve regurgitation  is not visualized. Pulmonic regurgitation is not visualized. Aorta: The aortic root and ascending aorta are structurally normal, with no evidence of dilitation. Venous: The inferior vena cava is normal in size with greater than 50% respiratory variability, suggesting right atrial pressure of 3 mmHg. IAS/Shunts: The interatrial septum was not well visualized.  LEFT VENTRICLE PLAX 2D LVIDd:         4.22 cm       Diastology LVIDs:         3.67 cm       LV e' lateral:   4.13 cm/s LV PW:         0.96 cm       LV E/e' lateral: 13.0 LV IVS:        1.06 cm       LV e' medial:    3.92 cm/s LVOT diam:     1.90 cm       LV E/e' medial:  13.7 LV SV:         22 ml LV SV Index:   12.45 LVOT Area:     2.84 cm  LV Volumes (MOD) LV area d, A2C:    36.60 cm LV area d, A4C:    40.90 cm LV area s, A2C:    27.00 cm LV area s, A4C:    30.90 cm LV major d, A2C:   8.77 cm LV major d, A4C:   9.01 cm LV major s, A2C:   8.11 cm LV major s, A4C:   8.24 cm LV vol d, MOD A2C: 127.0 ml LV vol d, MOD A4C: 153.0 ml LV vol s, MOD A2C: 76.9 ml LV vol s, MOD A4C: 98.9 ml LV SV MOD A2C:     50.1 ml LV SV MOD A4C:     153.0 ml LV SV MOD BP:      53.5 ml RIGHT VENTRICLE RV S prime:     13.20 cm/s TAPSE (M-mode): 2.6 cm LEFT ATRIUM             Index       RIGHT ATRIUM           Index LA diam:        3.30 cm 1.90 cm/m  RA Area:     10.10 cm LA Vol (A2C):   48.1 ml 27.69 ml/m RA Volume:   19.80 ml  11.40 ml/m LA Vol (A4C):   57.2 ml 32.93 ml/m LA Biplane Vol: 53.5 ml 30.80 ml/m  AORTIC VALVE LVOT Vmax:   119.00 cm/s LVOT Vmean:  79.200 cm/s  LVOT VTI:    0.200 m AI PHT:      1207 msec  AORTA Ao Root diam: 3.10 cm Ao Asc diam:  3.20 cm MITRAL VALVE MV Area (PHT): 3.65 cm             SHUNTS MV PHT:        60.32 msec           Systemic VTI:  0.20 m MV Decel Time: 208 msec             Systemic Diam: 1.90 cm MV E velocity: 53.80 cm/s 103 cm/s MV A velocity: 64.00 cm/s 70.3 cm/s MV E/A ratio:  0.84       1.5  Lyman Bishop MD Electronically signed by  Lyman Bishop MD Signature Date/Time: 01/02/2019/11:02:11 AM    Final    US Abdomen Limited RUQ  Result Date: 01/15/2019 CLINICAL DATA:  Elevated liver enzymes.  Hepatic metastatic disease EXAM: ULTRASOUND ABDOMEN LIMITED RIGHT UPPER QUADRANT COMPARISON:  CT 01/03/2019 FINDINGS: Gallbladder: No gallstones or wall thickening visualized. No sonographic Murphy sign noted by sonographer. Common bile duct: Diameter: 6 mm Liver: Numerous solid hepatic masses of varying sizes. Masses are predominantly echogenic with a hypoechoic halo. Background echogenicity is coarsened. Portal vein is patent on color Doppler imaging with normal direction of blood flow towards the liver. Other: None. IMPRESSION: 1. Numerous solid masses throughout the liver compatible with hepatic metastatic disease. 2. Unremarkable appearance of the gallbladder. Electronically Signed   By: Davina Poke D.O.   On: 01/15/2019 17:03    ASSESSMENT: This is a very pleasant 59 years old white female recently diagnosed with extensive stage (T3, M2, M1c) small cell lung cancer presented with obstructive right middle and lower lobe lung mass with collapse/consolidation of the right middle and lower lobe in addition to mediastinal lymphadenopathy and multiple metastatic liver lesions as well as bone metastasis.  This was diagnosed in January 2021.   PLAN: I had a lengthy discussion with the patient and her daughter-in-law today about her current disease stage, prognosis and treatment options. Unfortunately the patient has very poor prognosis and she has a terminal condition with a survival of few weeks if she does not receive any treatment. I gave her the option of palliative care and hospice referral versus consideration of palliative systemic chemotherapy with carboplatin initially with a reduced dose of 3 on day 1, etoposide 75 mg/M2 on days 1, 2 and 3 with Neulasta support in addition to Imfinzi 1500 mg on day 1 every 3 weeks followed by  maintenance Imfinzi every 4 weeks after the first 4 cycles. The patient will be treated with a reduced dose of chemotherapy during the first cycle because of the significant liver dysfunction as well as thrombocytopenia.  But we may increase her dose gradually as tolerated. I discussed with her the adverse effect of this treatment including but not limited to alopecia, myelosuppression, nausea and vomiting, peripheral neuropathy, liver or renal dysfunction as well as immunotherapy adverse effects. For the hypokalemia, I will start the patient on potassium chloride 40 mEq p.o. daily for 10 days. For the pain management she will continue on MS Contin 30 mg p.o. every 12 hour in addition to oxycodone for breakthrough pain. The patient will have a chemotherapy education class before the first dose of her treatment. I will call her pharmacy with prescription for Compazine 10 mg p.o. every 6 hours as needed for nausea. The patient is expected to start the first cycle  of this treatment early next week and she will have a follow-up appointment the week after for management of any adverse effect of her treatment. The patient was strongly advised to quit smoking. She was advised to call immediately if she has any other concerning symptoms in the interval. The patient voices understanding of current disease status and treatment options and is in agreement with the current care plan.  All questions were answered. The patient knows to call the clinic with any problems, questions or concerns. We can certainly see the patient much sooner if necessary.  Thank you so much for allowing me to participate in the care of Emily Thomas. I will continue to follow up the patient with you and assist in her care.  The total time spent in the appointment was 80 minutes.  Disclaimer: This note was dictated with voice recognition software. Similar sounding words can inadvertently be transcribed and may not be corrected upon  review.   Eilleen Kempf January 22, 2019, 2:19 PM

## 2019-01-23 ENCOUNTER — Inpatient Hospital Stay: Payer: Medicaid Other

## 2019-01-23 ENCOUNTER — Telehealth: Payer: Self-pay | Admitting: Medical Oncology

## 2019-01-23 ENCOUNTER — Encounter: Payer: Self-pay | Admitting: Internal Medicine

## 2019-01-23 NOTE — Telephone Encounter (Signed)
Emily Thomas called -CBC not drawn. Pt called and coming back

## 2019-01-23 NOTE — Telephone Encounter (Signed)
Pt update-Pt had take a nitro today . She does not feel good. I instructed Elmo Putt to call EMS if pt experiences chest pain symptoms.

## 2019-01-27 ENCOUNTER — Inpatient Hospital Stay: Payer: Medicaid Other

## 2019-01-27 ENCOUNTER — Encounter: Payer: Self-pay | Admitting: *Deleted

## 2019-01-27 ENCOUNTER — Telehealth: Payer: Self-pay | Admitting: Medical Oncology

## 2019-01-27 NOTE — Telephone Encounter (Signed)
She died February 04, 2021at home.

## 2019-01-27 NOTE — Progress Notes (Signed)
Oncology Nurse Navigator Documentation  Oncology Nurse Navigator Flowsheets 01/27/2019  Navigator Location [facility]  Navigator Encounter Type Other/I received a message from Dr. Julien Nordmann that patient is going with Hospice care.  I will update new patient coordinator.   Interventions Other  Acuity Level 2-Minimal Needs (1-2 Barriers Identified)  Time Spent with Patient 15

## 2019-01-28 ENCOUNTER — Ambulatory Visit: Payer: Medicaid Other

## 2019-01-29 ENCOUNTER — Ambulatory Visit: Payer: Medicaid Other

## 2019-01-31 ENCOUNTER — Ambulatory Visit: Payer: Medicaid Other

## 2019-02-03 ENCOUNTER — Ambulatory Visit: Payer: Medicaid Other | Admitting: Internal Medicine

## 2019-02-03 DEATH — deceased

## 2019-02-17 ENCOUNTER — Ambulatory Visit: Payer: Medicaid Other

## 2019-02-17 ENCOUNTER — Ambulatory Visit: Payer: Medicaid Other | Admitting: Internal Medicine

## 2019-02-17 ENCOUNTER — Other Ambulatory Visit: Payer: Medicaid Other

## 2019-02-18 ENCOUNTER — Ambulatory Visit: Payer: Medicaid Other

## 2019-02-19 ENCOUNTER — Ambulatory Visit: Payer: Medicaid Other

## 2019-02-21 ENCOUNTER — Ambulatory Visit: Payer: Medicaid Other

## 2019-03-10 ENCOUNTER — Other Ambulatory Visit: Payer: Medicaid Other

## 2019-03-10 ENCOUNTER — Ambulatory Visit: Payer: Medicaid Other

## 2019-03-10 ENCOUNTER — Ambulatory Visit: Payer: Medicaid Other | Admitting: Internal Medicine

## 2019-03-11 ENCOUNTER — Ambulatory Visit: Payer: Medicaid Other

## 2019-03-12 ENCOUNTER — Ambulatory Visit: Payer: Medicaid Other

## 2019-03-14 ENCOUNTER — Ambulatory Visit: Payer: Medicaid Other

## 2021-10-17 IMAGING — US US ABDOMEN LIMITED
1 series · 14 of 25 positions shown · non-contrast
Comparison: CT 01/03/2019

CLINICAL DATA: Elevated liver enzymes.  Hepatic metastatic disease

EXAM:
ULTRASOUND ABDOMEN LIMITED RIGHT UPPER QUADRANT

[Series 1: us abdomen limited · 14 of 38 slices shown]
[im 1/38]
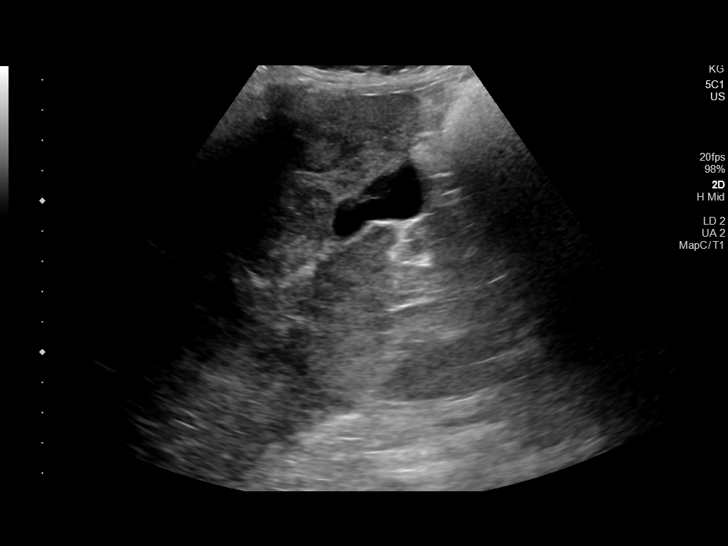
[im 4/38]
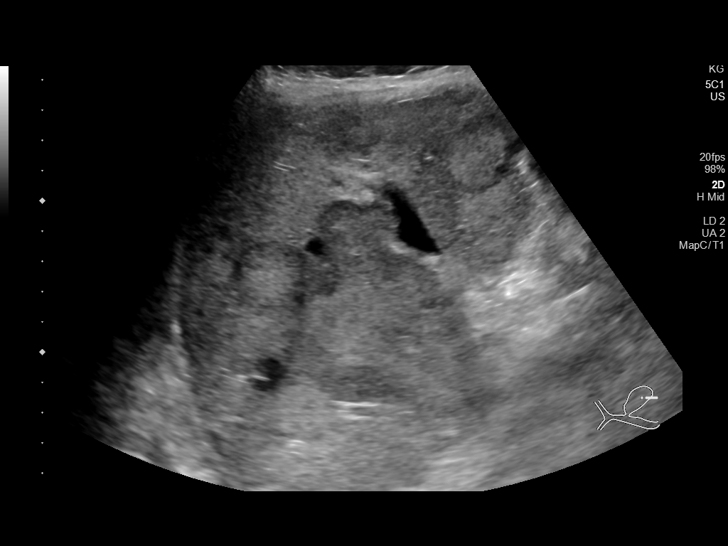
[im 7/38]
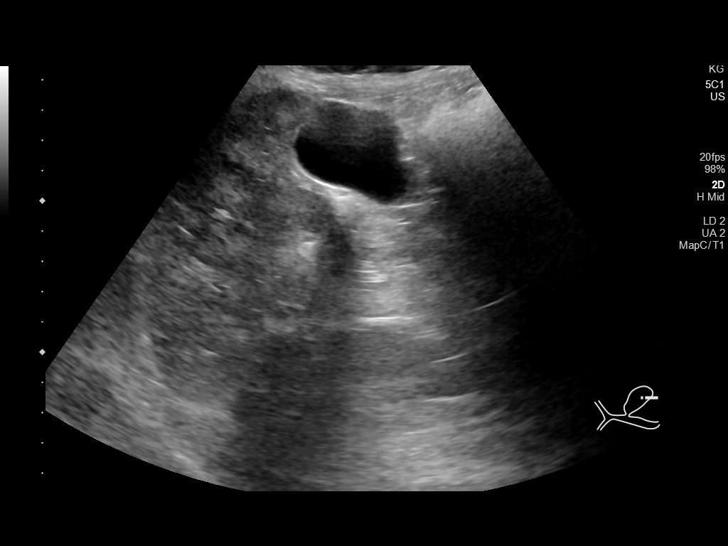
[im 10/38]
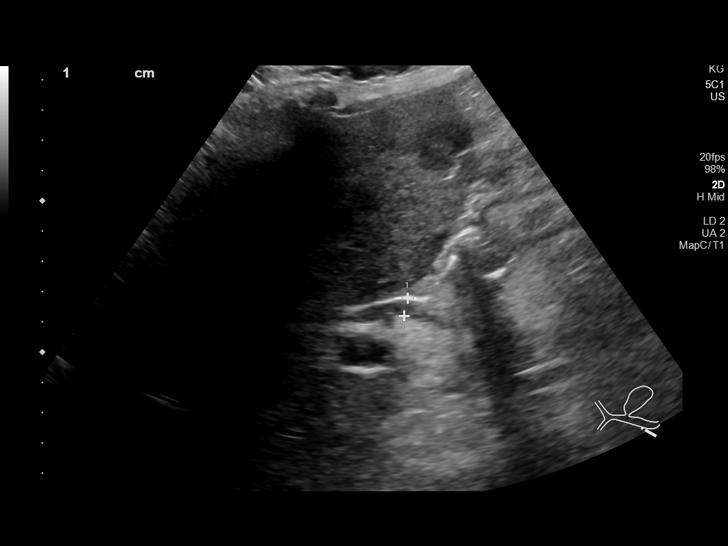
[im 13/38]
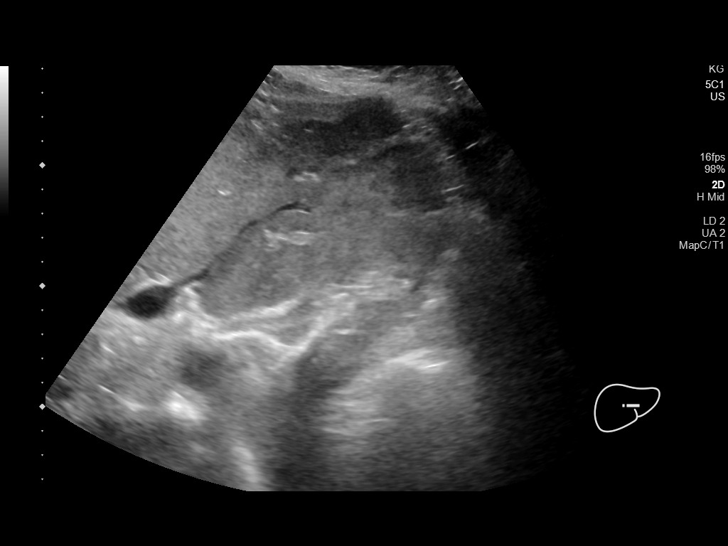
[im 14/38]
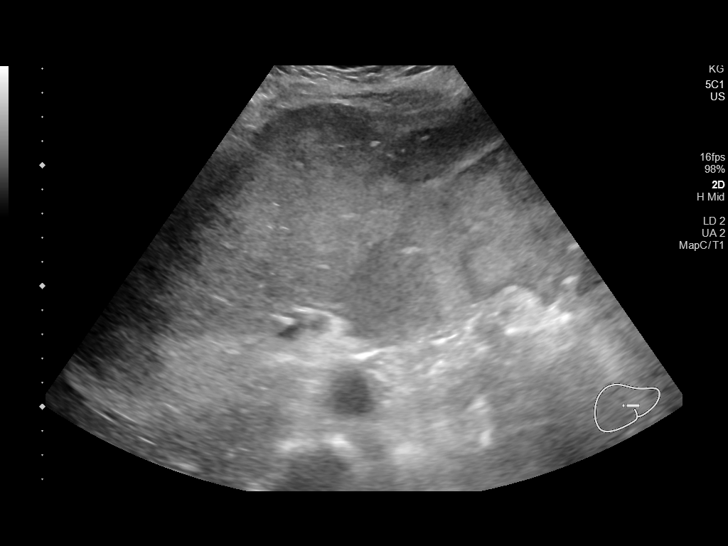
[im 17/38]
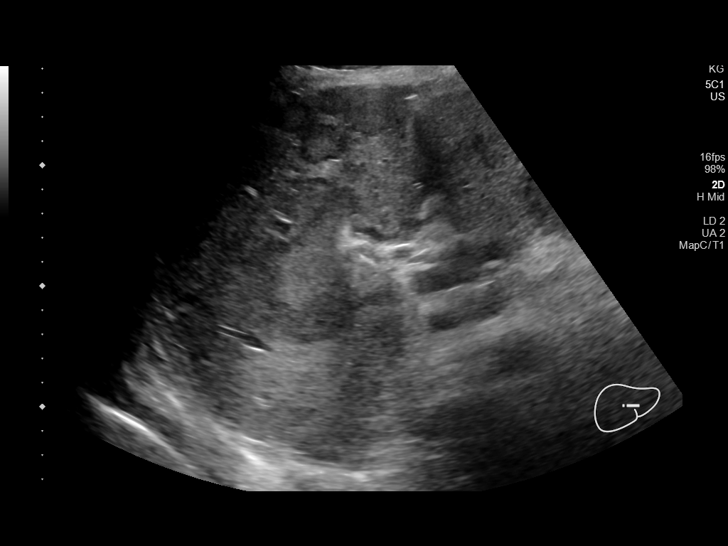
[im 21/38]
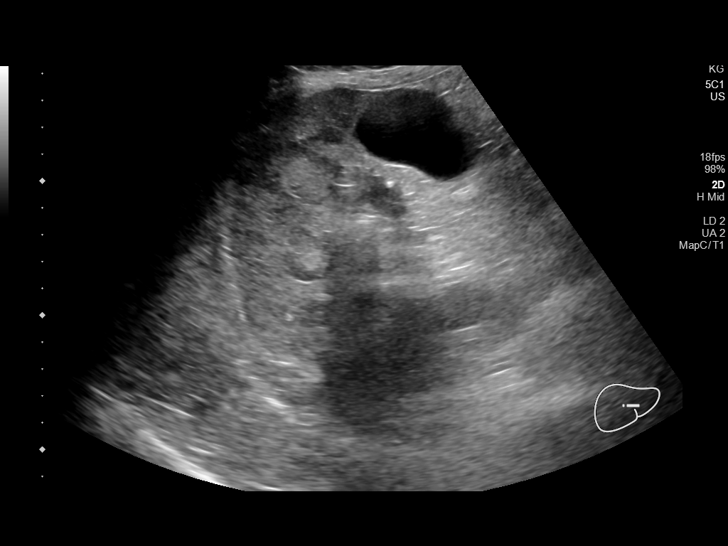
[im 24/38]
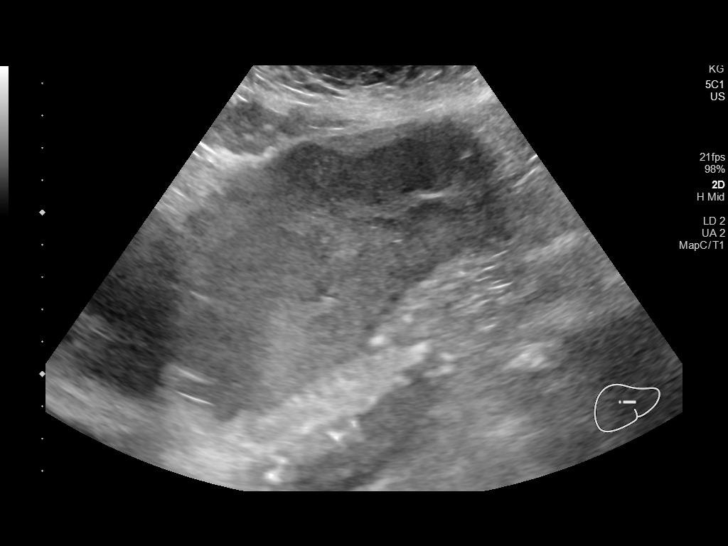
[im 25/38]
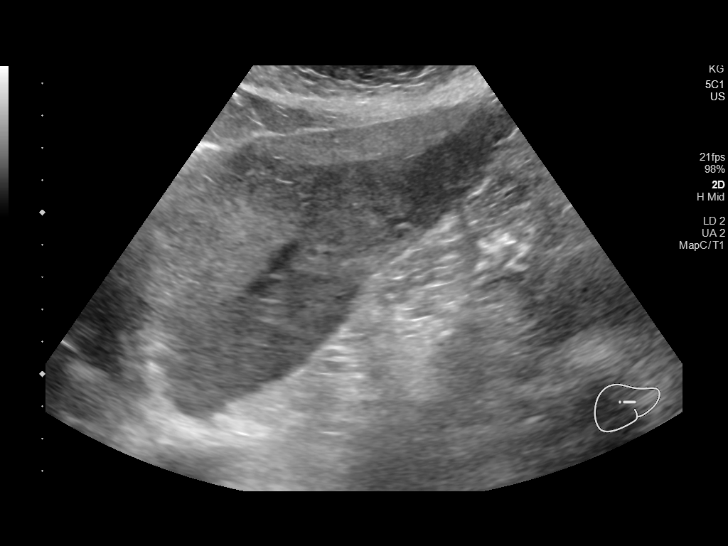
[im 28/38]
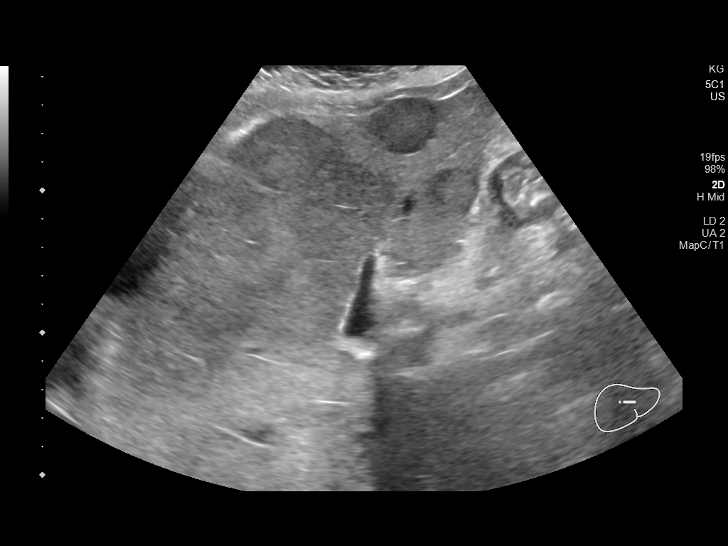
[im 31/38]
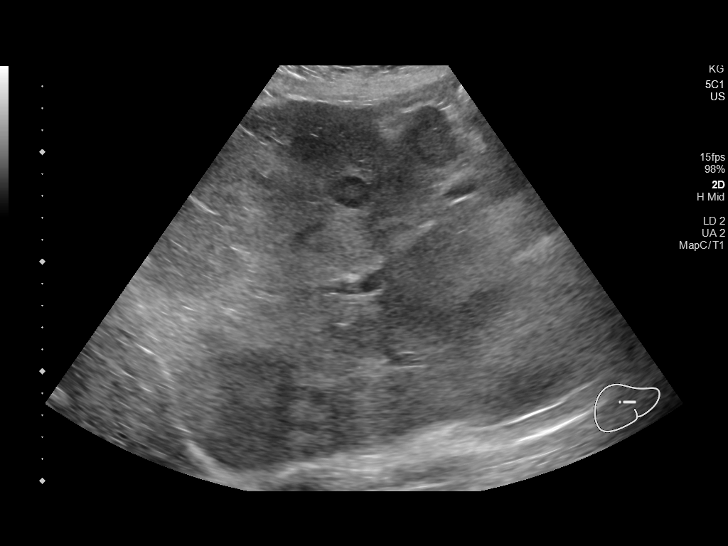
[im 34/38]
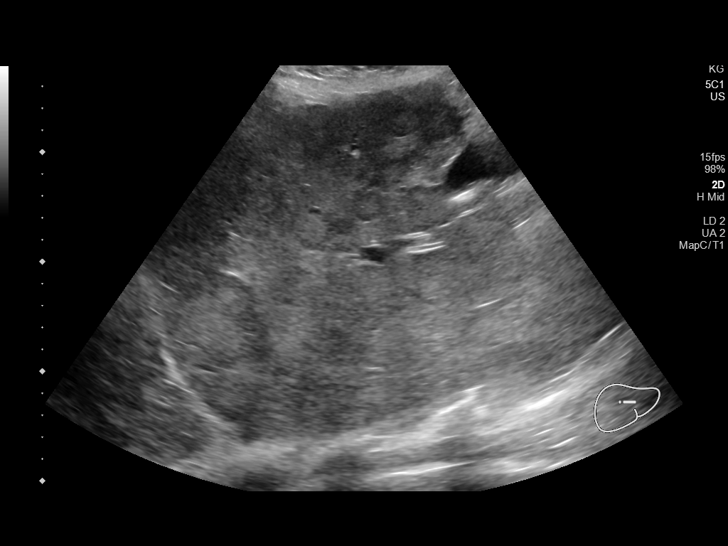
[im 38/38]
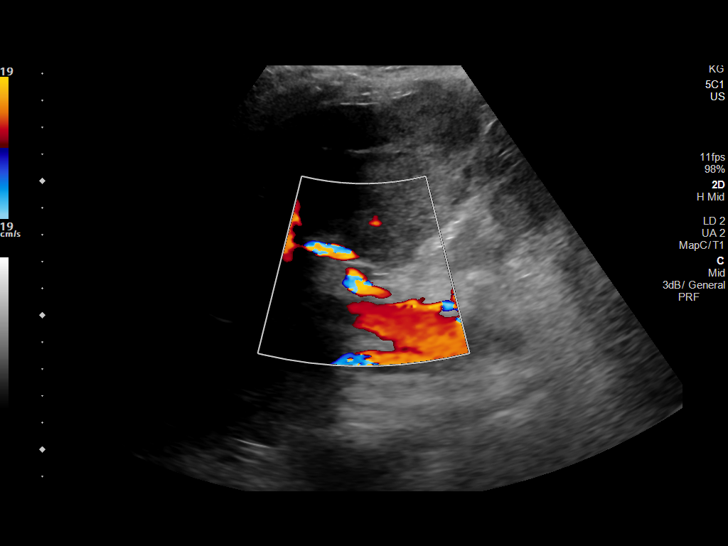

[14 of 25 positions shown; findings below may reference images not displayed]

FINDINGS: Gallbladder:

No gallstones or wall thickening visualized. No sonographic Murphy
sign noted by sonographer.

Common bile duct:

Diameter: 6 mm

Liver:

Numerous solid hepatic masses of varying sizes. Masses are
predominantly echogenic with a hypoechoic halo. Background
echogenicity is coarsened. Portal vein is patent on color Doppler
imaging with normal direction of blood flow towards the liver.

Other: None.
IMPRESSION: 1. Numerous solid masses throughout the liver compatible with
hepatic metastatic disease.
2. Unremarkable appearance of the gallbladder.
